# Patient Record
Sex: Female | Born: 1960 | Race: White | Hispanic: No | Marital: Married | Smoking: Former smoker
Health system: Southern US, Community
[De-identification: ages and names within clinical notes are randomized; demographics above are authoritative.]

## PROBLEM LIST (undated history)

## (undated) DIAGNOSIS — I10 Essential (primary) hypertension: Secondary | ICD-10-CM

## (undated) DIAGNOSIS — I429 Cardiomyopathy, unspecified: Secondary | ICD-10-CM

## (undated) DIAGNOSIS — F419 Anxiety disorder, unspecified: Secondary | ICD-10-CM

## (undated) DIAGNOSIS — I509 Heart failure, unspecified: Secondary | ICD-10-CM

## (undated) DIAGNOSIS — C4491 Basal cell carcinoma of skin, unspecified: Secondary | ICD-10-CM

## (undated) DIAGNOSIS — U071 COVID-19: Secondary | ICD-10-CM

## (undated) DIAGNOSIS — R12 Heartburn: Secondary | ICD-10-CM

## (undated) DIAGNOSIS — T7840XA Allergy, unspecified, initial encounter: Secondary | ICD-10-CM

## (undated) DIAGNOSIS — E1165 Type 2 diabetes mellitus with hyperglycemia: Secondary | ICD-10-CM

## (undated) DIAGNOSIS — IMO0002 Reserved for concepts with insufficient information to code with codable children: Secondary | ICD-10-CM

## (undated) HISTORY — DX: Type 2 diabetes mellitus with hyperglycemia: E11.65

## (undated) HISTORY — DX: Anxiety disorder, unspecified: F41.9

## (undated) HISTORY — PX: OTHER SURGICAL HISTORY: SHX169

## (undated) HISTORY — DX: Cardiomyopathy, unspecified: I42.9

## (undated) HISTORY — DX: Allergy, unspecified, initial encounter: T78.40XA

## (undated) HISTORY — DX: Basal cell carcinoma of skin, unspecified: C44.91

## (undated) HISTORY — DX: Heartburn: R12

## (undated) HISTORY — DX: Reserved for concepts with insufficient information to code with codable children: IMO0002

## (undated) HISTORY — DX: COVID-19: U07.1

---

## 1980-12-16 HISTORY — PX: FOOT SURGERY: SHX648

## 1998-12-16 HISTORY — PX: TONSILLECTOMY: SHX5217

## 1998-12-16 HISTORY — PX: UVULECTOMY: SHX2631

## 1998-12-16 HISTORY — PX: NASAL SEPTUM SURGERY: SHX37

## 2006-12-16 HISTORY — PX: CHOLECYSTECTOMY: SHX55

## 2016-03-01 LAB — HM PAP SMEAR: HM Pap smear: NORMAL

## 2016-03-29 ENCOUNTER — Other Ambulatory Visit: Payer: Self-pay | Admitting: Obstetrics and Gynecology

## 2016-03-29 DIAGNOSIS — R928 Other abnormal and inconclusive findings on diagnostic imaging of breast: Secondary | ICD-10-CM

## 2016-04-03 ENCOUNTER — Ambulatory Visit
Admission: RE | Admit: 2016-04-03 | Discharge: 2016-04-03 | Disposition: A | Discharge status: Home / Self Care | Payer: 59 | Source: Ambulatory Visit | Attending: Obstetrics and Gynecology | Admitting: Obstetrics and Gynecology

## 2016-04-03 DIAGNOSIS — R928 Other abnormal and inconclusive findings on diagnostic imaging of breast: Secondary | ICD-10-CM

## 2016-05-08 ENCOUNTER — Encounter: Payer: Self-pay | Admitting: Family

## 2016-05-08 ENCOUNTER — Ambulatory Visit (INDEPENDENT_AMBULATORY_CARE_PROVIDER_SITE_OTHER): Payer: 59 | Admitting: Family

## 2016-05-08 VITALS — BP 112/67 | HR 95 | Temp 98.0°F | Resp 18 | Ht 67.0 in | Wt 210.0 lb

## 2016-05-08 DIAGNOSIS — E119 Type 2 diabetes mellitus without complications: Secondary | ICD-10-CM | POA: Diagnosis not present

## 2016-05-08 DIAGNOSIS — K219 Gastro-esophageal reflux disease without esophagitis: Secondary | ICD-10-CM | POA: Diagnosis not present

## 2016-05-08 DIAGNOSIS — Z9109 Other allergy status, other than to drugs and biological substances: Secondary | ICD-10-CM | POA: Insufficient documentation

## 2016-05-08 DIAGNOSIS — F4321 Adjustment disorder with depressed mood: Secondary | ICD-10-CM

## 2016-05-08 DIAGNOSIS — Z91048 Other nonmedicinal substance allergy status: Secondary | ICD-10-CM

## 2016-05-08 DIAGNOSIS — I429 Cardiomyopathy, unspecified: Secondary | ICD-10-CM | POA: Diagnosis not present

## 2016-05-08 DIAGNOSIS — I428 Other cardiomyopathies: Secondary | ICD-10-CM | POA: Insufficient documentation

## 2016-05-08 DIAGNOSIS — Z85828 Personal history of other malignant neoplasm of skin: Secondary | ICD-10-CM

## 2016-05-08 MED ORDER — ASPIRIN EC 81 MG PO TBEC: 81.0000 mg | DELAYED_RELEASE_TABLET | Freq: Every day | ORAL | Status: DC

## 2016-05-08 MED ORDER — ATORVASTATIN CALCIUM 10 MG PO TABS: 10.0000 mg | ORAL_TABLET | Freq: Every day | ORAL | Status: DC

## 2016-05-08 NOTE — Assessment & Plan Note (Signed)
Stable on daily zyrtec, continue same.  

## 2016-05-08 NOTE — Progress Notes (Signed)
Subjective:    Patient ID: Katrina Kelly, female    DOB: 1961/10/06, 55 y.o.   MRN: [id number]  HPI  Katrina Kelly is a 55 yr old female who presents today to establish care. Pmhx is significant for the folllowing:  1) GERD- maintained on zantac, only takes once a day in the evenings.  reports that her gerd symptoms are generally controlled during the day. Night is worse.   2) Hyperglycemia- was told that her hemoglobin was 6.5 at GYN.   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 Jannette Fogo Pasi. Reports some chronic DOE which is at baseline.  4) hx of depression- Ran out of wellbutrin a few weeks ago.  Reports that she had a premature grandchild, then brother had a stroke.  This was 5 years ago and she had a lot of stress. pcp put her on wellbutrin. Has felt good despite being off of medication for several weeks. Wishes to remain off.    Environmental allergies- has done allergy shots in the past and was on allergy shots in the past. Now managed with daily zyrtec.   Hx Basal cell carcinoma- has follow up tomorrow with Derm. 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  HENT: Positive for rhinorrhea.   Respiratory:       Chronic DOE, at baseline Mild cough from allergy drainage  Cardiovascular: Negative for chest pain.  Gastrointestinal: Negative for diarrhea and constipation.  Genitourinary: Negative for dysuria and frequency.  Musculoskeletal: Negative for joint swelling and arthralgias.  Hematological: Negative for adenopathy.  Psychiatric/Behavioral:       Denies depression/anxiety       Past Medical History  Diagnosis Date  . Anxiety   . Cardiomyopathy ([place])     EF 35% per pt  . Heartburn   . Allergy   . Basal cell carcinoma      Social History   Social History  . Marital Status: Married     Spouse Name: N/A  . Number of Children: N/A  . Years of Education: N/A   Occupational History  . Not on file.   Social History Main Topics  . Smoking status: Former Research scientist (life sciences)  . Smokeless tobacco: Never Used  . Alcohol Use: 0.6 oz/week    1 Standard drinks or equivalent per week  . Drug Use: No  . Sexual Activity: Not on file   Other Topics Concern  . 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   Daughter/fiance and grandson live downstairs   Enjoys beach, decorating/yard work.   One dog       Past Surgical History  Procedure Laterality Date  . Cholecystectomy  2008  . Tonsillectomy  2000  . Uvulectomy  2000  . Nasal septum surgery  2000  . Foot surgery  1982    "benign tumor bottom of right foot"    Family History  Problem Relation Age of Onset  . Diabetes Mother   . Hypertension Mother   . Cancer Father     lung  . Emphysema Father   . Diabetes Sister   . Diabetes Brother   . Leukemia Brother   . Stroke Brother   . Heart attack Maternal Uncle   . Breast cancer Paternal Aunt   .  Lymphoma Paternal Uncle   . Polycystic ovary syndrome Daughter   . Hypertension Daughter     No Known Allergies  No current outpatient prescriptions on file prior to visit.   No current facility-administered medications on file prior to visit.    BP 112/67 mmHg  Pulse 95  Temp(Src) 98 F (36.7 C) (Oral)  Resp 18  Ht 5\' 7"  (1.702 m)  Wt 210 lb (95.255 kg)  BMI 32.88 kg/m2  SpO2 99%  LMP 12/16/2010    Objective:   Physical Exam  Constitutional: She is oriented to person, place, and time. She appears well-developed and well-nourished.  HENT:  Head: Normocephalic and atraumatic.  Right Ear: Tympanic membrane and ear canal normal.  Left Ear: Tympanic membrane and ear canal normal.  Mouth/Throat: No oropharyngeal exudate, posterior oropharyngeal edema or posterior oropharyngeal erythema.   Cardiovascular: Normal rate, regular rhythm and normal heart sounds.   No murmur heard. Pulmonary/Chest: Effort normal and breath sounds normal. No respiratory distress. She has no wheezes.  Musculoskeletal:  Trace bilateral LE edema is noted  Neurological: She is alert and oriented to person, place, and time.  Skin: Skin is warm and dry.  Psychiatric: She has a normal mood and affect. Her behavior is normal. Judgment and thought content normal.          Assessment & Plan:  Hx of situational depression- Advised pt ok to remain off of wellbutrin, call if recurrent depression sxs occur.

## 2016-05-08 NOTE — Assessment & Plan Note (Signed)
Stable, management per cardiology.

## 2016-05-08 NOTE — Assessment & Plan Note (Signed)
A1C 6.5. Discussed diet, exercise, weight loss.  Obtain urine microalbumin. Add statin LDL 111. Goal <100.  Add baby aspirin for cardiac protection.

## 2016-05-08 NOTE — Assessment & Plan Note (Signed)
Followed by derm. Discussed sun safety.

## 2016-05-08 NOTE — Progress Notes (Signed)
Pre visit review using our clinic review tool, if applicable. No additional management support is needed unless otherwise documented below in the visit note. 

## 2016-05-08 NOTE — Patient Instructions (Addendum)
Please complete lab work prior to leaving.  Please add aspirin 81mg  once daily. Add atorvastatin 10mg  once daily.  Take zantac twice daily.  OK to remain off of wellbutrin.  Welcome to [facility]!

## 2016-05-08 NOTE — Assessment & Plan Note (Signed)
Uncontrolled.  Advised pt to increase zantac to bid, continue dietary modification and to work on weight loss.

## 2016-05-10 ENCOUNTER — Ambulatory Visit (INDEPENDENT_AMBULATORY_CARE_PROVIDER_SITE_OTHER): Payer: 59 | Admitting: Family Medicine

## 2016-05-10 ENCOUNTER — Telehealth: Payer: Self-pay | Admitting: Family

## 2016-05-10 ENCOUNTER — Encounter: Payer: Self-pay | Admitting: Family Medicine

## 2016-05-10 VITALS — BP 126/82 | HR 88 | Temp 98.2°F | Ht 67.0 in

## 2016-05-10 DIAGNOSIS — J329 Chronic sinusitis, unspecified: Secondary | ICD-10-CM

## 2016-05-10 DIAGNOSIS — H6593 Unspecified nonsuppurative otitis media, bilateral: Secondary | ICD-10-CM | POA: Diagnosis not present

## 2016-05-10 DIAGNOSIS — J310 Chronic rhinitis: Secondary | ICD-10-CM

## 2016-05-10 DIAGNOSIS — R42 Dizziness and giddiness: Secondary | ICD-10-CM

## 2016-05-10 MED ORDER — AMOXICILLIN-POT CLAVULANATE 875-125 MG PO TABS: 1.0000 | ORAL_TABLET | Freq: Two times a day (BID) | ORAL | Status: DC

## 2016-05-10 MED ORDER — FLUTICASONE PROPIONATE 50 MCG/ACT NA SUSP: 2.0000 | Freq: Every day | NASAL | Status: DC

## 2016-05-10 MED ORDER — MECLIZINE HCL 25 MG PO TABS: 25.0000 mg | ORAL_TABLET | Freq: Three times a day (TID) | ORAL | Status: DC | PRN

## 2016-05-10 NOTE — Patient Instructions (Signed)
Schedule follow up in 1-2 weeks  AFRIN nasal spray twice daily for 4 days  Flonase 2 sprays each nostril daily for 21 days  Continue zyrtec  If sinus issues worsening or not improving then take the antibiotic as instructed  Can use meclizine if dizziness recurs.  If symptoms weakness, numbness, visual loss, trouble speaking, severe or worsening, seek care immediately

## 2016-05-10 NOTE — Progress Notes (Signed)
HPI:  Katrina Kelly a very pleasant 55 year old here for an acute visit for sinus congestion and vertigo. She reports chronic issues with allergies with evaluation with a new provider recently. However she has had worsening of her chronic nasal congestion, cough, postnasal drip and pressure in the ears over the last several days. She also had several episodes of vertigo today. These were all acute in onset and precipitated by a quick movement of the head. The episodes are brief. She did have some nausea. Her mother has a history of vertigo.she has been using some Zyrtec for her allergies and occasionally uses Flonase. She denies headache,sinus pain, tooth pain, weakness, numbness, vision changes, speech changes,fevers, chest pain, shortness of breath, falls or hearing loss.   ROS: See pertinent positives and negatives per HPI.  Past Medical History  Diagnosis Date  . Anxiety   . Cardiomyopathy ([place])     EF 35% per pt  . Heartburn   . Allergy   . Basal cell carcinoma     Past Surgical History  Procedure Laterality Date  . Cholecystectomy  2008  . Tonsillectomy  2000  . Uvulectomy  2000  . Nasal septum surgery  2000  . Foot surgery  1982    "benign tumor bottom of right foot"    Family History  Problem Relation Age of Onset  . Diabetes Mother   . Hypertension Mother   . Cancer Father     lung  . Emphysema Father   . Diabetes Sister   . Diabetes Brother   . Leukemia Brother   . Stroke Brother   . Heart attack Maternal Uncle   . Breast cancer Paternal Aunt   . Lymphoma Paternal Uncle   . Polycystic ovary syndrome Daughter   . Hypertension Daughter     Social History   Social History  . Marital Status: Married    Spouse Name: N/A  . Number of Children: N/A  . Years of Education: N/A   Social History Main Topics  . Smoking status: Former Research scientist (life sciences)  . Smokeless tobacco: Never Used  . Alcohol Use: 0.6 oz/week    1 Standard drinks or equivalent per week  . Drug Use:  No  . Sexual Activity: Not Asked   Other Topics Concern  . 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   Daughter/fiance and grandson live downstairs   Enjoys beach, decorating/yard work.   One dog        Current outpatient prescriptions:  .  aspirin EC 81 MG tablet, Take 1 tablet (81 mg total) by mouth daily., Disp: , Rfl:  .  atorvastatin (LIPITOR) 10 MG tablet, Take 1 tablet (10 mg total) by mouth daily., Disp: 30 tablet, Rfl: 5 .  buPROPion (WELLBUTRIN SR) 150 MG 12 hr tablet, Take 150 mg by mouth daily., Disp: , Rfl:  .  carvedilol (COREG) 6.25 MG tablet, Take 6.25 mg by mouth 2 (two) times daily., Disp: , Rfl:  .  cetirizine (ZYRTEC) 10 MG tablet, Take 10 mg by mouth daily., Disp: , Rfl:  .  furosemide (LASIX) 40 MG tablet, Take 40 mg by mouth daily as needed., Disp: , Rfl:  .  isosorbide mononitrate (IMDUR) 30 MG 24 hr tablet, Take 30 mg by mouth daily., Disp: , Rfl:  .  lisinopril (PRINIVIL,ZESTRIL) 5 MG tablet, Take 5 mg by mouth daily., Disp: , Rfl:  .  nitroGLYCERIN (NITROSTAT)  0.4 MG SL tablet, Place 0.4 mg under the tongue as needed., Disp: , Rfl:  .  potassium chloride (K-DUR,KLOR-CON) 10 MEQ tablet, Take 10 mEq by mouth daily as needed., Disp: , Rfl:  .  ranitidine (ZANTAC) 150 MG tablet, Take 150 mg by mouth daily., Disp: , Rfl:  .  amoxicillin-clavulanate (AUGMENTIN) 875-125 MG tablet, Take 1 tablet by mouth 2 (two) times daily., Disp: 20 tablet, Rfl: 0 .  fluticasone (FLONASE) 50 MCG/ACT nasal spray, Place 2 sprays into both nostrils daily., Disp: 16 g, Rfl: 0 .  meclizine (ANTIVERT) 25 MG tablet, Take 1 tablet (25 mg total) by mouth 3 (three) times daily as needed for dizziness., Disp: 30 tablet, Rfl: 0 .  oxymetazoline (AFRIN NASAL SPRAY) 0.05 % nasal spray, Place 2 sprays into both nostrils 2 (two) times daily., Disp: 30 mL, Rfl: 0  EXAM:  Filed Vitals:   05/10/16 1439  BP: 126/82   Pulse: 88  Temp: 98.2 F (36.8 C)    There is no weight on file to calculate BMI.  GENERAL: vitals reviewed and listed above, alert, oriented, appears well hydrated and in no acute distress  HEENT: atraumatic, conjunttiva clear,visual acuity grossly intact, PERRLA, no obvious abnormalities on inspection of external nose and ears, normal appearance of ear canals and TMs with clear effusions bilaterally, thick white nasal congestion, mild post oropharyngeal erythema with PND, no tonsillar edema or exudate, no sinus TTP  NECK: no obvious masses on inspection, no bruit  LUNGS: clear to auscultation bilaterally, no wheezes, rales or rhonchi, good air movement  CV: HRRR, no peripheral edema  MS: moves all extremities without noticeable abnormality  PSYCH: pleasant and cooperative, no obvious depression or anxiety  NEURO: cranial nerves II through XII grossly intact, finger to nose normal, speech and thought processing grossly intact, gait is normal on my exam in the room, no appreciable weakness, Katrina Kelly Round is negative and actually results in resolution of her symptoms   ASSESSMENT AND PLAN:  Discussed the following assessment and plan:  Vertigo -we discussed possible serious and likely etiologies, workup and treatment, treatment risks and return precautions-sounded like positional vertigo from the history, resolved in the room, she reported this was miraculous and was happy. I am not sure if the maneuvers we did testing precipitated this? Sinuses and eustachian tube dysfunction could be playing a role as well. -after this discussion, Katrina Kelly opted for meclizine to use if needed, close follow-up with PCP to ensure resolution of symptoms -of course, we advised Katrina Kelly  to return or notify a doctor immediately if symptoms worsen or persist or new concerns arise.  Rhinosinusitis Middle ear effusion, bilateral -she is not having sinus pain or fevers at this point, but she has thick white  purulent appearing discharge in the nasal passages -I have seen vertigo as a symptom is sinus infection -We will up her allergy regimen and treat with a short course of nasal decongestant, if symptoms do not improve she was given a printed antibiotic to take if needed after discussion of risks and return precautions  -Patient advised to return or notify a doctor immediately if symptoms worsen or persist or new concerns arise.  Patient Instructions  Schedule follow up in 1-2 weeks  AFRIN nasal spray twice daily for 4 days  Flonase 2 sprays each nostril daily for 21 days  Continue zyrtec  If sinus issues worsening or not improving then take the antibiotic as instructed  Can use meclizine if dizziness recurs.  If  symptoms weakness, numbness, visual loss, trouble speaking, severe or worsening, seek care immediately     Tanganyika Bowlds R.

## 2016-05-10 NOTE — Telephone Encounter (Signed)
error:[id number] ° °

## 2016-05-10 NOTE — Progress Notes (Signed)
Pre visit review using our clinic review tool, if applicable. No additional management support is needed unless otherwise documented below in the visit note. 

## 2016-08-13 ENCOUNTER — Encounter: Payer: Self-pay | Admitting: Family

## 2016-08-13 ENCOUNTER — Encounter: Payer: Self-pay | Admitting: Gastroenterology

## 2016-08-13 ENCOUNTER — Ambulatory Visit (INDEPENDENT_AMBULATORY_CARE_PROVIDER_SITE_OTHER): Payer: 59 | Admitting: Family

## 2016-08-13 VITALS — BP 114/57 | HR 46 | Temp 98.2°F | Resp 17 | Ht 67.0 in | Wt 208.8 lb

## 2016-08-13 DIAGNOSIS — R0981 Nasal congestion: Secondary | ICD-10-CM

## 2016-08-13 DIAGNOSIS — K219 Gastro-esophageal reflux disease without esophagitis: Secondary | ICD-10-CM | POA: Diagnosis not present

## 2016-08-13 DIAGNOSIS — Z23 Encounter for immunization: Secondary | ICD-10-CM | POA: Diagnosis not present

## 2016-08-13 DIAGNOSIS — Z0000 Encounter for general adult medical examination without abnormal findings: Secondary | ICD-10-CM | POA: Diagnosis not present

## 2016-08-13 DIAGNOSIS — R110 Nausea: Secondary | ICD-10-CM | POA: Diagnosis not present

## 2016-08-13 DIAGNOSIS — H579 Unspecified disorder of eye and adnexa: Secondary | ICD-10-CM

## 2016-08-13 DIAGNOSIS — E2839 Other primary ovarian failure: Secondary | ICD-10-CM

## 2016-08-13 LAB — TSH: TSH: 2.28 u[IU]/mL (ref 0.35–4.50)

## 2016-08-13 LAB — CBC WITH DIFFERENTIAL/PLATELET
Basophils Absolute: 0.1 10*3/uL (ref 0.0–0.1)
Basophils Relative: 1 % (ref 0.0–3.0)
Eosinophils Absolute: 0.2 10*3/uL (ref 0.0–0.7)
Eosinophils Relative: 2.4 % (ref 0.0–5.0)
HCT: 43.1 % (ref 36.0–46.0)
Hemoglobin: 14.5 g/dL (ref 12.0–15.0)
Lymphocytes Relative: 33.2 % (ref 12.0–46.0)
Lymphs Abs: 3 10*3/uL (ref 0.7–4.0)
MCHC: 33.8 g/dL (ref 30.0–36.0)
MCV: 87.2 fl (ref 78.0–100.0)
Monocytes Absolute: 0.7 10*3/uL (ref 0.1–1.0)
Monocytes Relative: 7.7 % (ref 3.0–12.0)
Neutro Abs: 5.1 10*3/uL (ref 1.4–7.7)
Neutrophils Relative %: 55.7 % (ref 43.0–77.0)
Platelets: 323 10*3/uL (ref 150.0–400.0)
RBC: 4.94 Mil/uL (ref 3.87–5.11)
RDW: 13 % (ref 11.5–15.5)
WBC: 9.1 10*3/uL (ref 4.0–10.5)

## 2016-08-13 LAB — LIPID PANEL
Cholesterol: 121 mg/dL (ref 0–200)
HDL: 33.6 mg/dL — ABNORMAL LOW (ref >39.00)
LDL Cholesterol: 61 mg/dL (ref 0–99)
NonHDL: 87.42
Total CHOL/HDL Ratio: 4
Triglycerides: 130 mg/dL (ref 0.0–149.0)
VLDL: 26 mg/dL (ref 0.0–40.0)

## 2016-08-13 LAB — URINALYSIS, ROUTINE W REFLEX MICROSCOPIC
Bilirubin Urine: NEGATIVE
Hgb urine dipstick: NEGATIVE
Ketones, ur: NEGATIVE
Nitrite: NEGATIVE
Specific Gravity, Urine: 1.025 (ref 1.000–1.030)
Total Protein, Urine: NEGATIVE
Urine Glucose: NEGATIVE
Urobilinogen, UA: 0.2 (ref 0.0–1.0)
pH: 5 (ref 5.0–8.0)

## 2016-08-13 LAB — BASIC METABOLIC PANEL
BUN: 11 mg/dL (ref 6–23)
CO2: 26 mEq/L (ref 19–32)
Calcium: 8.8 mg/dL (ref 8.4–10.5)
Chloride: 107 mEq/L (ref 96–112)
Creatinine, Ser: 0.82 mg/dL (ref 0.40–1.20)
GFR: 76.94 mL/min (ref >60.00)
Glucose, Bld: 156 mg/dL — ABNORMAL HIGH (ref 70–99)
Potassium: 3.9 mEq/L (ref 3.5–5.1)
Sodium: 139 mEq/L (ref 135–145)

## 2016-08-13 LAB — HEPATIC FUNCTION PANEL
ALT: 23 U/L (ref 0–35)
AST: 30 U/L (ref 0–37)
Albumin: 3.9 g/dL (ref 3.5–5.2)
Alkaline Phosphatase: 69 U/L (ref 39–117)
Bilirubin, Direct: 0.1 mg/dL (ref 0.0–0.3)
Total Bilirubin: 0.6 mg/dL (ref 0.2–1.2)
Total Protein: 7.5 g/dL (ref 6.0–8.3)

## 2016-08-13 LAB — H. PYLORI ANTIBODY, IGG: H Pylori IgG: NEGATIVE

## 2016-08-13 MED ORDER — PANTOPRAZOLE SODIUM 40 MG PO TBEC: 40.0000 mg | DELAYED_RELEASE_TABLET | Freq: Every day | ORAL | 3 refills | Status: DC

## 2016-08-13 NOTE — Progress Notes (Signed)
Subjective:    Patient ID: Katrina Kelly, female    DOB: 03/31/1961, 55 y.o.   MRN: [id number]  HPI  Patient presents today for complete physical.  Immunizations: last tetanus was 6 years ago.   Diet:  Tries to eat a healthy diet, could cut down on processed food Exercise:  Not exercising Dexa: due Pap Smear: 4/17, + HPV (following with GYN)  Mammogram: 4/17 (small cyst) Vision: up to date Dental: up to date [place], due  Patient reports c/o post prandial GI upset.  Has BM diarrhea within 10 minutes of eating after many of her meals.  Tends to be worse after fried foods and heavy food like "fettucine alfredo." Not a new problem but has been worsening x 2 weeks.  She had cholecystectomy back in 2008.  She does have hx of GERD and takes zantac 1 tab 1-2 times a day.   Wt Readings from Last 3 Encounters:  08/13/16 208 lb 12.8 oz (94.7 kg)  05/08/16 210 lb (95.3 kg)   Reports lesions near her eyes that she would like removed. Derm told her to see eye doctor.    Review of Systems  Constitutional: Negative for fever and unexpected weight change.  HENT: Negative for hearing loss.        + sinus congestion x < 1 week.    Eyes: Negative for visual disturbance.  Respiratory: Positive for shortness of breath. Negative for cough.   Cardiovascular: Negative for chest pain and leg swelling.       Reports that she is following with cardiology. Reports SOB with walking up flight of stairs. Reports baseline  Gastrointestinal: Positive for diarrhea. Negative for anal bleeding and constipation.  Genitourinary: Negative for dysuria and frequency.  Musculoskeletal:       Some knee pain  Skin: Negative for rash.       Following with derm for hx of skin CA- has q6 month skin check.    Neurological: Negative for headaches.  Hematological: Negative for adenopathy.  Psychiatric/Behavioral:       Denies depression/anxiety       Past Medical History:  Diagnosis Date  . Allergy   . Anxiety     . Basal cell carcinoma   . Cardiomyopathy ([place])    EF 35% per pt  . Heartburn      Social History   Social History  . Marital status: Married    Spouse name: N/A  . Number of children: N/A  . Years of education: N/A   Occupational History  . Not on file.   Social History Main Topics  . Smoking status: Former Research scientist (life sciences)  . Smokeless tobacco: Never Used  . Alcohol use 0.6 oz/week    1 Standard drinks or equivalent per week  . Drug use: No  . Sexual activity: Not on file   Other Topics Concern  . 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   Daughter/fiance and grandson live downstairs   Enjoys beach, decorating/yard work.   One dog       Past Surgical History:  Procedure Laterality Date  . CHOLECYSTECTOMY  2008  . FOOT SURGERY  1982   "benign tumor bottom of right foot"  . NASAL SEPTUM SURGERY  2000  . TONSILLECTOMY  2000  . UVULECTOMY  2000    Family History  Problem Relation Age of Onset  . Diabetes Mother   .  Hypertension Mother   . Cancer Father     lung  . Emphysema Father   . Diabetes Sister   . Diabetes Brother   . Leukemia Brother   . Stroke Brother   . Heart attack Maternal Uncle   . Breast cancer Paternal Aunt   . Lymphoma Paternal Uncle   . Polycystic ovary syndrome Daughter   . Hypertension Daughter     No Known Allergies  Current Outpatient Prescriptions on File Prior to Visit  Medication Sig Dispense Refill  . aspirin EC 81 MG tablet Take 1 tablet (81 mg total) by mouth daily.    Marland Kitchen atorvastatin (LIPITOR) 10 MG tablet Take 1 tablet (10 mg total) by mouth daily. 30 tablet 5  . carvedilol (COREG) 6.25 MG tablet Take 6.25 mg by mouth 2 (two) times daily.    . cetirizine (ZYRTEC) 10 MG tablet Take 10 mg by mouth daily.    . fluticasone (FLONASE) 50 MCG/ACT nasal spray Place 2 sprays into both nostrils daily. 16 g 0  . furosemide (LASIX) 40 MG tablet Take 40 mg  by mouth daily as needed.    . isosorbide mononitrate (IMDUR) 30 MG 24 hr tablet Take 30 mg by mouth daily.    Marland Kitchen lisinopril (PRINIVIL,ZESTRIL) 5 MG tablet Take 5 mg by mouth daily.    . meclizine (ANTIVERT) 25 MG tablet Take 1 tablet (25 mg total) by mouth 3 (three) times daily as needed for dizziness. 30 tablet 0  . oxymetazoline (AFRIN NASAL SPRAY) 0.05 % nasal spray Place 2 sprays into both nostrils 2 (two) times daily. 30 mL 0  . potassium chloride (K-DUR,KLOR-CON) 10 MEQ tablet Take 10 mEq by mouth daily as needed.    . nitroGLYCERIN (NITROSTAT) 0.4 MG SL tablet Place 0.4 mg under the tongue as needed.     No current facility-administered medications on file prior to visit.     BP (!) 114/57 (BP Location: Right Arm, Patient Position: Sitting, Cuff Size: Normal)   Pulse (!) 46   Temp 98.2 F (36.8 C) (Oral)   Resp 17   Ht 5\' 7"  (1.702 m)   Wt 208 lb 12.8 oz (94.7 kg)   SpO2 97%   BMI 32.70 kg/m    Objective:   Physical Exam  Physical Exam  Constitutional: She is oriented to person, place, and time. She appears well-developed and well-nourished. No distress.  HENT:  Head: Normocephalic and atraumatic.  Right Ear: Tympanic membrane and ear canal normal.  Left Ear: Tympanic membrane and ear canal normal.  Mouth/Throat: Oropharynx is clear and moist.  Eyes: Pupils are equal, round, and reactive to light. No scleral icterus. + small cysts beneath bilateral inner inner canthus Neck: Normal range of motion. No thyromegaly present.  Cardiovascular: Normal rate and regular rhythm.   No murmur heard. Pulmonary/Chest: Effort normal and breath sounds normal. No respiratory distress. He has no wheezes. She has no rales. She exhibits no tenderness.  Abdominal: Soft. Bowel sounds are normal. She exhibits no distension and no mass. There is no tenderness. There is no rebound and no guarding.  Musculoskeletal: She exhibits no edema.  Lymphadenopathy:    She has no cervical adenopathy.    Neurological: She is alert and oriented to person, place, and time. She has normal patellar reflexes. She exhibits normal muscle tone. Coordination normal.  Skin: Skin is warm and dry.  Psychiatric: She has a normal mood and affect. Her behavior is normal. Judgment and thought content normal.  Breast/pelvic:  deferred to GYN         Assessment & Plan:         Assessment & Plan:  GERD- Suspect GI sxs secondary to GERD, hx of cholecystectomy and lactose intolerance. Advised pt as follows. Please stop zantac, begin protonix once daily.  Avoid dairy and fried foods. Check H pylori IgG.  Plan follow up in 6 weeks. If symptoms worsen or do not improve, plan to refer to GI.    Preventative Care-  Flu shot today, refer for dexa, colo. Discussed healthy diet, exercise and weight loss. Obtain routine lab work. Flu shot today.   Sinus congestion- Continue zyrtec, add a nasal saline rinse. Call if sinus congestion worsen or if it is not improved in 2-3 days. Will consider abx at that time.   Eye lesions- refer to opthalmology for excision.

## 2016-08-13 NOTE — Progress Notes (Signed)
Pre visit review using our clinic review tool, if applicable. No additional management support is needed unless otherwise documented below in the visit note. 

## 2016-08-13 NOTE — Patient Instructions (Addendum)
Please stop zantac, begin protonix once daily.  Avoid dairy and fried foods. Continue zyrtec, add a nasal saline rinse. Call if sinus congestion worsen or if it is not improved in 2-3 days.  Please complete lab work prior to leaving. You will be contacted about your referral to opthalmology.

## 2016-08-15 ENCOUNTER — Other Ambulatory Visit (INDEPENDENT_AMBULATORY_CARE_PROVIDER_SITE_OTHER): Payer: 59

## 2016-08-15 DIAGNOSIS — R739 Hyperglycemia, unspecified: Secondary | ICD-10-CM

## 2016-08-15 LAB — HEMOGLOBIN A1C: Hgb A1c MFr Bld: 7.3 % — ABNORMAL HIGH (ref 4.6–6.5)

## 2016-08-16 ENCOUNTER — Ambulatory Visit (HOSPITAL_BASED_OUTPATIENT_CLINIC_OR_DEPARTMENT_OTHER)
Admission: RE | Admit: 2016-08-16 | Discharge: 2016-08-16 | Disposition: A | Discharge status: Home / Self Care | Payer: 59 | Source: Ambulatory Visit | Attending: Family | Admitting: Family

## 2016-08-16 ENCOUNTER — Encounter: Payer: Self-pay | Admitting: Family

## 2016-08-16 ENCOUNTER — Telehealth: Payer: Self-pay | Admitting: Family

## 2016-08-16 DIAGNOSIS — E1165 Type 2 diabetes mellitus with hyperglycemia: Principal | ICD-10-CM

## 2016-08-16 DIAGNOSIS — E2839 Other primary ovarian failure: Secondary | ICD-10-CM

## 2016-08-16 DIAGNOSIS — IMO0001 Reserved for inherently not codable concepts without codable children: Secondary | ICD-10-CM

## 2016-08-16 DIAGNOSIS — E118 Type 2 diabetes mellitus with unspecified complications: Secondary | ICD-10-CM | POA: Insufficient documentation

## 2016-08-16 NOTE — Telephone Encounter (Signed)
Please let pt know that A1C shows diabetes.  a1C is 7.3, diagnosis of DM is 6.5 and greater. I would like to see it under 7. Please work on avoiding concentrated sweets/white carbs, increasing exercise and weight loss. Follow up in 3 months.

## 2016-08-16 NOTE — Telephone Encounter (Signed)
Also, please let her know that her bone density is normal.

## 2016-08-16 NOTE — Telephone Encounter (Signed)
Left message for pt to return my call on Tuesday.

## 2016-08-16 NOTE — Telephone Encounter (Signed)
Left message on patient mobile phone to give a call back.

## 2016-08-20 NOTE — Telephone Encounter (Signed)
Notified pt and she voices understanding. 

## 2016-09-23 ENCOUNTER — Encounter: Payer: Self-pay | Admitting: Family

## 2016-09-23 ENCOUNTER — Ambulatory Visit (INDEPENDENT_AMBULATORY_CARE_PROVIDER_SITE_OTHER): Payer: 59 | Admitting: Family

## 2016-09-23 DIAGNOSIS — Z9109 Other allergy status, other than to drugs and biological substances: Secondary | ICD-10-CM

## 2016-09-23 DIAGNOSIS — K219 Gastro-esophageal reflux disease without esophagitis: Secondary | ICD-10-CM | POA: Diagnosis not present

## 2016-09-23 DIAGNOSIS — G5600 Carpal tunnel syndrome, unspecified upper limb: Secondary | ICD-10-CM | POA: Insufficient documentation

## 2016-09-23 DIAGNOSIS — G5601 Carpal tunnel syndrome, right upper limb: Secondary | ICD-10-CM

## 2016-09-23 MED ORDER — MELOXICAM 7.5 MG PO TABS: 7.5000 mg | ORAL_TABLET | Freq: Every day | ORAL | 0 refills | Status: DC

## 2016-09-23 NOTE — Progress Notes (Signed)
Subjective:    Patient ID: Katrina Kelly, female    DOB: 02-Nov-1961, 55 y.o.   MRN: [id number]  HPI  Katrina Kelly is a 55 yr old female who presents today for follow up.  GERD-Last visit she noted post prandial GI upset and post prandial diarrhea.  She was on zantac at that visit.  She was advised to stop zantac and begin protonix at that visit. We also discussed avoidance of dairy and fried foods.  H pylori testing was negative. She notes that she has changed her diet and symptoms are much improved.   Wt Readings from Last 3 Encounters:  09/23/16 209 lb 9.6 oz (95.1 kg)  08/13/16 208 lb 12.8 oz (94.7 kg)  05/08/16 210 lb (95.3 kg)   Sinus symptoms are stable on zyrtec.   R hand numbness- occurs when typing and holding the phone. Has been going on for quite some time.   Review of Systems See HPI  Past Medical History:  Diagnosis Date  . Allergy   . Anxiety   . Basal cell carcinoma   . Cardiomyopathy ([place])    EF 35% per pt  . Diabetes type 2, uncontrolled ([place])   . Heartburn      Social History   Social History  . Marital status: Married    Spouse name: N/A  . Number of children: N/A  . Years of education: N/A   Occupational History  . Not on file.   Social History Main Topics  . Smoking status: Former Research scientist (life sciences)  . Smokeless tobacco: Never Used  . Alcohol use 0.6 oz/week    1 Standard drinks or equivalent per week  . Drug use: No  . Sexual activity: Not on file   Other Topics Concern  . 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   Daughter/fiance and grandson live downstairs   Enjoys beach, decorating/yard work.   One dog       Past Surgical History:  Procedure Laterality Date  . CHOLECYSTECTOMY  2008  . FOOT SURGERY  1982   "benign tumor bottom of right foot"  . NASAL SEPTUM SURGERY  2000  . TONSILLECTOMY  2000  . UVULECTOMY  2000    Family History  Problem Relation  Age of Onset  . Diabetes Mother   . Hypertension Mother   . Cancer Father     lung  . Emphysema Father   . Diabetes Sister   . Diabetes Brother   . Leukemia Brother   . Stroke Brother   . Heart attack Maternal Uncle   . Breast cancer Paternal Aunt   . Lymphoma Paternal Uncle   . Polycystic ovary syndrome Daughter   . Hypertension Daughter   . Diabetes Mellitus II Maternal Grandmother     No Known Allergies  Current Outpatient Prescriptions on File Prior to Visit  Medication Sig Dispense Refill  . atorvastatin (LIPITOR) 10 MG tablet Take 1 tablet (10 mg total) by mouth daily. 30 tablet 5  . carvedilol (COREG) 6.25 MG tablet Take 6.25 mg by mouth 2 (two) times daily.    . cetirizine (ZYRTEC) 10 MG tablet Take 10 mg by mouth daily.    . furosemide (LASIX) 40 MG tablet Take 40 mg by mouth daily as needed.    . isosorbide mononitrate (IMDUR) 30 MG 24 hr tablet Take 30 mg by mouth daily.    Marland Kitchen lisinopril (  PRINIVIL,ZESTRIL) 5 MG tablet Take 5 mg by mouth daily.    Marland Kitchen oxymetazoline (AFRIN NASAL SPRAY) 0.05 % nasal spray Place 2 sprays into both nostrils 2 (two) times daily. 30 mL 0  . pantoprazole (PROTONIX) 40 MG tablet Take 1 tablet (40 mg total) by mouth daily. 30 tablet 3  . potassium chloride (K-DUR,KLOR-CON) 10 MEQ tablet Take 10 mEq by mouth daily as needed.    . nitroGLYCERIN (NITROSTAT) 0.4 MG SL tablet Place 0.4 mg under the tongue as needed.     No current facility-administered medications on file prior to visit.     BP 119/68 (BP Location: Right Arm, Cuff Size: Large)   Pulse 78   Temp 98.2 F (36.8 C) (Oral)   Resp 18   Ht 5\' 7"  (1.702 m)   Wt 209 lb 9.6 oz (95.1 kg)   SpO2 98% Comment: room air  BMI 32.83 kg/m       Objective:   Physical Exam  Constitutional: She is oriented to person, place, and time. She appears well-developed and well-nourished.  Cardiovascular: Normal rate, regular rhythm and normal heart sounds.   No murmur heard. Pulmonary/Chest: Effort  normal and breath sounds normal. No respiratory distress. She has no wheezes.  Neurological: She is alert and oriented to person, place, and time.  R hand, Neg Tinels, + Phalans  Psychiatric: She has a normal mood and affect. Her behavior is normal. Judgment and thought content normal.          Assessment & Plan:

## 2016-09-23 NOTE — Assessment & Plan Note (Signed)
Stable on zyrtec.  Continue same.  

## 2016-09-23 NOTE — Progress Notes (Signed)
Pre visit review using our clinic review tool, if applicable. No additional management support is needed unless otherwise documented below in the visit note. 

## 2016-09-23 NOTE — Assessment & Plan Note (Signed)
Patient was given a wrist brace today to use while sleeping and as able during the day. Will also give short course of meloxicam.

## 2016-09-23 NOTE — Patient Instructions (Signed)
Wear wrist splint while sleeping and as you are able throughout the day. Begin meloxicam once daily for right carpal tunnel. Call if new/worsening symptoms.

## 2016-09-23 NOTE — Assessment & Plan Note (Signed)
Improved, continue dietary changes.  Continue protonix.

## 2016-09-26 ENCOUNTER — Other Ambulatory Visit: Payer: Self-pay

## 2016-09-26 ENCOUNTER — Telehealth: Payer: Self-pay

## 2016-09-26 DIAGNOSIS — Z1211 Encounter for screening for malignant neoplasm of colon: Secondary | ICD-10-CM

## 2016-09-26 NOTE — Telephone Encounter (Signed)
Colonoscopy at [facility] 11/01/16 Procedure at 11:00. Arrive at 10:00 am Keep pre-visit appointment

## 2016-09-26 NOTE — Telephone Encounter (Signed)
Dr Silverio Decamp,      According to [place] on 09/23/16 pt has an EF of 35% which disqualifies her for [place] per anesthesia protocol.  Would you like an OV or make her a direct [facility] pt?                                                            Thank you,                                                                     Angela/PV

## 2016-09-26 NOTE — Telephone Encounter (Signed)
She can be direct at hospital next available non urgent block for scope at [facility]. Please do not schedule during inpatient week.

## 2016-09-26 NOTE — Telephone Encounter (Signed)
Patient contacted and colonoscopy is moved to 11/01/16.

## 2016-09-30 ENCOUNTER — Ambulatory Visit (AMBULATORY_SURGERY_CENTER): Payer: Self-pay

## 2016-09-30 VITALS — Ht 67.0 in | Wt 210.8 lb

## 2016-09-30 DIAGNOSIS — Z1211 Encounter for screening for malignant neoplasm of colon: Secondary | ICD-10-CM

## 2016-09-30 MED ORDER — SUPREP BOWEL PREP KIT 17.5-3.13-1.6 GM/177ML PO SOLN
1.0000 | Freq: Once | ORAL | 0 refills | Status: AC
Start: 2016-09-30 — End: 2016-09-30

## 2016-09-30 NOTE — Progress Notes (Signed)
No allergies to eggs or soy No past problems with anesthesia No diet meds No home oxygen  Declined emmi 

## 2016-10-08 ENCOUNTER — Telehealth: Payer: Self-pay | Admitting: Gastroenterology

## 2016-10-08 NOTE — Telephone Encounter (Signed)
Returned patients call  Will give her a Suprep kit when they arrive.[facility] staff message to put patient on list

## 2016-10-14 ENCOUNTER — Encounter: Payer: 59 | Admitting: Gastroenterology

## 2016-10-25 ENCOUNTER — Encounter (HOSPITAL_COMMUNITY): Payer: Self-pay | Admitting: *Deleted

## 2016-10-25 ENCOUNTER — Telehealth: Payer: Self-pay

## 2016-10-25 NOTE — Telephone Encounter (Signed)
Lm on vm that I would leave a Suprep sample up front to be picked up 

## 2016-10-31 ENCOUNTER — Telehealth: Payer: Self-pay | Admitting: Gastroenterology

## 2016-10-31 NOTE — Telephone Encounter (Signed)
ok 

## 2016-10-31 NOTE — Telephone Encounter (Signed)
Dr Silverio Decamp, This patient called and left a message on phone tree but we have not been able to get in touch with her about cancelling her procedure or the reason why. I tried contacting the patient to verify but she has not returned my call   FYI In case she does not show up tomorrow

## 2016-11-01 ENCOUNTER — Ambulatory Visit (HOSPITAL_BASED_OUTPATIENT_CLINIC_OR_DEPARTMENT_OTHER)
Admission: RE | Admit: 2016-11-01 | Discharge: 2016-11-01 | Disposition: A | Discharge status: Home / Self Care | Payer: 59 | Source: Ambulatory Visit | Attending: Medical | Admitting: Medical

## 2016-11-01 ENCOUNTER — Ambulatory Visit (INDEPENDENT_AMBULATORY_CARE_PROVIDER_SITE_OTHER): Payer: 59 | Admitting: Medical

## 2016-11-01 ENCOUNTER — Telehealth: Payer: Self-pay

## 2016-11-01 ENCOUNTER — Encounter (HOSPITAL_COMMUNITY): Payer: Self-pay | Admitting: Anesthesiology

## 2016-11-01 ENCOUNTER — Encounter: Payer: Self-pay | Admitting: Medical

## 2016-11-01 ENCOUNTER — Ambulatory Visit (HOSPITAL_COMMUNITY): Admission: RE | Admit: 2016-11-01 | Payer: 59 | Source: Ambulatory Visit | Admitting: Gastroenterology

## 2016-11-01 VITALS — BP 120/76 | Temp 98.4°F | Ht 67.0 in | Wt 210.0 lb

## 2016-11-01 DIAGNOSIS — R059 Cough, unspecified: Secondary | ICD-10-CM

## 2016-11-01 DIAGNOSIS — J029 Acute pharyngitis, unspecified: Secondary | ICD-10-CM | POA: Diagnosis not present

## 2016-11-01 DIAGNOSIS — R6883 Chills (without fever): Secondary | ICD-10-CM | POA: Diagnosis not present

## 2016-11-01 DIAGNOSIS — R05 Cough: Secondary | ICD-10-CM

## 2016-11-01 DIAGNOSIS — D72829 Elevated white blood cell count, unspecified: Secondary | ICD-10-CM

## 2016-11-01 DIAGNOSIS — M791 Myalgia, unspecified site: Secondary | ICD-10-CM

## 2016-11-01 HISTORY — DX: Heart failure, unspecified: I50.9

## 2016-11-01 HISTORY — DX: Essential (primary) hypertension: I10

## 2016-11-01 LAB — CBC WITH DIFFERENTIAL/PLATELET
Basophils Absolute: 0.1 10*3/uL (ref 0.0–0.1)
Basophils Relative: 0.5 % (ref 0.0–3.0)
Eosinophils Absolute: 0.1 10*3/uL (ref 0.0–0.7)
Eosinophils Relative: 0.3 % (ref 0.0–5.0)
HCT: 41.6 % (ref 36.0–46.0)
Hemoglobin: 13.9 g/dL (ref 12.0–15.0)
Lymphocytes Relative: 12.7 % (ref 12.0–46.0)
Lymphs Abs: 2.8 10*3/uL (ref 0.7–4.0)
MCHC: 33.5 g/dL (ref 30.0–36.0)
MCV: 86.8 fl (ref 78.0–100.0)
Monocytes Absolute: 2 10*3/uL — ABNORMAL HIGH (ref 0.1–1.0)
Monocytes Relative: 8.8 % (ref 3.0–12.0)
Neutro Abs: 17.2 10*3/uL — ABNORMAL HIGH (ref 1.4–7.7)
Neutrophils Relative %: 77.7 % — ABNORMAL HIGH (ref 43.0–77.0)
Platelets: 291 10*3/uL (ref 150.0–400.0)
RBC: 4.8 Mil/uL (ref 3.87–5.11)
RDW: 13.4 % (ref 11.5–15.5)
WBC: 22.2 10*3/uL (ref 4.0–10.5)

## 2016-11-01 LAB — POC INFLUENZA A&B (BINAX/QUICKVUE)
Influenza A, POC: NEGATIVE
Influenza B, POC: NEGATIVE

## 2016-11-01 LAB — POCT RAPID STREP A (OFFICE): Rapid Strep A Screen: POSITIVE — AB

## 2016-11-01 LAB — CBC AND DIFFERENTIAL: WBC: 22.2 10*3/mL

## 2016-11-01 SURGERY — COLONOSCOPY WITH PROPOFOL
Anesthesia: Monitor Anesthesia Care

## 2016-11-01 MED ORDER — PROPOFOL 10 MG/ML IV BOLUS
INTRAVENOUS | Status: AC
  Filled 2016-11-01: qty 40

## 2016-11-01 MED ORDER — BENZONATATE 100 MG PO CAPS: 100.0000 mg | ORAL_CAPSULE | Freq: Three times a day (TID) | ORAL | 0 refills | Status: DC | PRN

## 2016-11-01 MED ORDER — FLUTICASONE PROPIONATE 50 MCG/ACT NA SUSP: 2.0000 | Freq: Every day | NASAL | 1 refills | Status: DC

## 2016-11-01 MED ORDER — LIDOCAINE 2% (20 MG/ML) 5 ML SYRINGE
INTRAMUSCULAR | Status: AC
  Filled 2016-11-01: qty 5

## 2016-11-01 MED ORDER — AMOXICILLIN-POT CLAVULANATE 875-125 MG PO TABS: 1.0000 | ORAL_TABLET | Freq: Two times a day (BID) | ORAL | 0 refills | Status: DC

## 2016-11-01 MED ORDER — ALBUTEROL SULFATE HFA 108 (90 BASE) MCG/ACT IN AERS: 2.0000 | INHALATION_SPRAY | Freq: Four times a day (QID) | RESPIRATORY_TRACT | 0 refills | Status: DC | PRN

## 2016-11-01 NOTE — Patient Instructions (Addendum)
Your strep test was positive(faint). I am prescribing augmentin  antibiotic. Rest hydrate, tylenol for fever and warm salt water gargles.   Your flu test was negative today.  You also have some bronchitis type symptoms but want to get cxr as well. Make sure no pneumonia.  For cough rx benzonatate. For nasl congestion fx flonase. For recent mild wheezing rx albuterol.  Please get cbc today as well.   Follow up in 7 days or as needed  If chest xray shows pneumonia may add additional anitbiotic.   See the note made regarding high wbc. Called pt and made plan.

## 2016-11-01 NOTE — Progress Notes (Signed)
Pre visit review using our clinic review tool, if applicable. No additional management support is needed unless otherwise documented below in the visit note. 

## 2016-11-01 NOTE — Telephone Encounter (Signed)
Received call from [facility] with the lab regarding a critical wbc result =22.2 on patient. Result given to E. Saguier,PA-C. Abstracted in patients chart.

## 2016-11-01 NOTE — Progress Notes (Addendum)
Subjective:    Patient ID: Katrina Kelly, female    DOB: 1961/08/24, 55 y.o.   MRN: [id number]  HPI  Pt in stating on wednesday night started with some chills. ST started Thursday. Pain is severe. Pain swallowing. Pt states some coughing. Productive cough with slight blood tinge to  mucous. Pt grandson had strep throat. He lives with pt.   Pt is wheezing some/mild. No history of asthma. Pt not a smoker.   On wed had body aches. She also had chills   Review of Systems  Constitutional: Positive for chills, fatigue and fever.  HENT: Positive for congestion and sore throat. Negative for facial swelling, nosebleeds, postnasal drip, sinus pain and sinus pressure.   Respiratory: Positive for cough and wheezing.   Cardiovascular: Negative for chest pain and palpitations.  Gastrointestinal: Negative for abdominal pain.  Musculoskeletal: Positive for myalgias.       On wed but now none.  Neurological: Negative for dizziness, seizures, speech difficulty, weakness and headaches.  Hematological: Positive for adenopathy. Does not bruise/bleed easily.  Psychiatric/Behavioral: Negative for behavioral problems and confusion.    Past Medical History:  Diagnosis Date  . Allergy   . Anxiety   . Basal cell carcinoma   . Cardiomyopathy ([place])    EF 35% per pt  . CHF (congestive heart failure) ([employer] [facility])   . Diabetes type 2, uncontrolled ([place])   . Heartburn   . Hypertension      Social History   Social History  . Marital status: Married    Spouse name: N/A  . Number of children: N/A  . Years of education: N/A   Occupational History  . Not on file.   Social History Main Topics  . Smoking status: Former Research scientist (life sciences)  . Smokeless tobacco: Never Used  . Alcohol use 0.6 oz/week    1 Standard drinks or equivalent per week     Comment: twice monthly  . Drug use: No  . Sexual activity: Not on file   Other Topics Concern  . 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   Daughter/fiance and grandson live downstairs   Enjoys beach, decorating/yard work.   One dog       Past Surgical History:  Procedure Laterality Date  . CHOLECYSTECTOMY  2008  . FOOT SURGERY  1982   "benign tumor bottom of right foot"  . NASAL SEPTUM SURGERY  2000  . TONSILLECTOMY  2000  . uterine ablation    . UVULECTOMY  2000    Family History  Problem Relation Age of Onset  . Diabetes Mother   . Hypertension Mother   . Cancer Father     lung  . Emphysema Father   . Diabetes Sister   . Diabetes Brother   . Leukemia Brother   . Stroke Brother   . Heart attack Maternal Uncle   . Breast cancer Paternal Aunt   . Lymphoma Paternal Uncle   . Polycystic ovary syndrome Daughter   . Hypertension Daughter   . Diabetes Mellitus II Maternal Grandmother   . Colon cancer Neg Hx     Allergies  Allergen Reactions  . Lactose Intolerance (Gi)     GI symptoms    Current Outpatient Prescriptions on File Prior to Visit  Medication Sig Dispense Refill  . atorvastatin (LIPITOR) 10 MG tablet Take 1 tablet (10 mg total) by mouth daily. 30 tablet  5  . carvedilol (COREG) 6.25 MG tablet Take 6.25 mg by mouth 2 (two) times daily.    . cetirizine (ZYRTEC) 10 MG tablet Take 10 mg by mouth daily.    . furosemide (LASIX) 40 MG tablet Take 40 mg by mouth daily as needed for edema.     Marland Kitchen ibuprofen (ADVIL,MOTRIN) 200 MG tablet Take 400 mg by mouth every 6 (six) hours as needed for headache or moderate pain.    . isosorbide mononitrate (IMDUR) 30 MG 24 hr tablet Take 30 mg by mouth at bedtime.     Marland Kitchen lisinopril (PRINIVIL,ZESTRIL) 5 MG tablet Take 5 mg by mouth daily.    . meloxicam (MOBIC) 7.5 MG tablet Take 1 tablet (7.5 mg total) by mouth daily. 14 tablet 0  . oxymetazoline (AFRIN NASAL SPRAY) 0.05 % nasal spray Place 2 sprays into both nostrils 2 (two) times daily as needed for congestion.  30 mL 0  . pantoprazole (PROTONIX) 40 MG  tablet Take 1 tablet (40 mg total) by mouth daily. (Patient taking differently: Take 40 mg by mouth at bedtime. ) 30 tablet 3  . potassium chloride (K-DUR,KLOR-CON) 10 MEQ tablet Take 10 mEq by mouth daily as needed (swelling).     . nitroGLYCERIN (NITROSTAT) 0.4 MG SL tablet Place 0.4 mg under the tongue every 5 (five) minutes as needed for chest pain.      No current facility-administered medications on file prior to visit.     BP 120/76 (BP Location: Left Arm, Patient Position: Sitting, Cuff Size: Large)   Temp 98.4 F (36.9 C) (Oral)   Ht 5\' 7"  (1.702 m)   Wt 210 lb (95.3 kg)   BMI 32.89 kg/m      Objective:   Physical Exam  General  Mental Status - Alert. General Appearance - Well groomed. Not in acute distress.  Skin Rashes- No Rashes.  HEENT Head- Normal. Ear Auditory Canal - Left- Normal. Right - Normal.Tympanic Membrane- Left- Normal. Right- Normal. Eye Sclera/Conjunctiva- Left- Normal. Right- Normal. Nose & Sinuses Nasal Mucosa- Left-  Boggy and Congested. Right-  Boggy and  Congested.Bilateral no  maxillary and  No frontal sinus pressure. Mouth & Throat Lips: Upper Lip- Normal: no dryness, cracking, pallor, cyanosis, or vesicular eruption. Lower Lip-Normal: no dryness, cracking, pallor, cyanosis or vesicular eruption. Buccal Mucosa- Bilateral- No Aphthous ulcers. Oropharynx- No Discharge or Erythema. Tonsils: Characteristics- Bilateral- Erythema and Congestion. Size/Enlargement- Bilateral- No enlargement. Discharge- bilateral-None.  Neck Neck- Supple. No Masses.tender submandibular nodes on neck exam.   Chest and Lung Exam Auscultation: Breath Sounds:-Clear even and unlabored.  Cardiovascular Auscultation:Rythm- Regular, rate and rhythm. Murmurs & Other Heart Sounds:Ausculatation of the heart reveal- No Murmurs.  Lymphatic Head & Neck General Head & Neck Lymphatics: Bilateral: Description- tender submandibular nodes on neck exam.       Assessment &  Plan:  Your strep test was positive(faint). I am prescribing augmentin  antibiotic. Rest hydrate, tylenol for fever and warm salt water gargles.   Your flu test was negative today.  You also have some bronchitis type symptoms but want to get cxr as well. Make sure no pneumonia.  For cough rx benzonatate. For nasl congestion fx flonase. For recent mild wheezing rx albuterol.  Please get cbc today as well.   Follow up in 5 days or as needed  If chest xray shows pneumonia may add additional anitbiotic.  See the note made regarding high wbc. Called pt and made plan.  Evian Derringer, [name], PA-C

## 2016-11-04 ENCOUNTER — Other Ambulatory Visit (INDEPENDENT_AMBULATORY_CARE_PROVIDER_SITE_OTHER): Payer: 59

## 2016-11-04 DIAGNOSIS — D72829 Elevated white blood cell count, unspecified: Secondary | ICD-10-CM | POA: Diagnosis not present

## 2016-11-04 LAB — CBC WITH DIFFERENTIAL/PLATELET
Basophils Absolute: 0.1 10*3/uL (ref 0.0–0.1)
Basophils Relative: 0.8 % (ref 0.0–3.0)
Eosinophils Absolute: 0.3 10*3/uL (ref 0.0–0.7)
Eosinophils Relative: 2.4 % (ref 0.0–5.0)
HCT: 43.2 % (ref 36.0–46.0)
Hemoglobin: 14.8 g/dL (ref 12.0–15.0)
Lymphocytes Relative: 27.2 % (ref 12.0–46.0)
Lymphs Abs: 3.3 10*3/uL (ref 0.7–4.0)
MCHC: 34.2 g/dL (ref 30.0–36.0)
MCV: 85.7 fl (ref 78.0–100.0)
Monocytes Absolute: 1.1 10*3/uL — ABNORMAL HIGH (ref 0.1–1.0)
Monocytes Relative: 8.9 % (ref 3.0–12.0)
Neutro Abs: 7.5 10*3/uL (ref 1.4–7.7)
Neutrophils Relative %: 60.7 % (ref 43.0–77.0)
Platelets: 370 10*3/uL (ref 150.0–400.0)
RBC: 5.04 Mil/uL (ref 3.87–5.11)
RDW: 13.4 % (ref 11.5–15.5)
WBC: 12.3 10*3/uL — ABNORMAL HIGH (ref 4.0–10.5)

## 2016-11-11 ENCOUNTER — Other Ambulatory Visit (INDEPENDENT_AMBULATORY_CARE_PROVIDER_SITE_OTHER): Payer: 59

## 2016-11-11 DIAGNOSIS — D72829 Elevated white blood cell count, unspecified: Secondary | ICD-10-CM

## 2016-11-11 LAB — CBC WITH DIFFERENTIAL/PLATELET
Basophils Absolute: 0.1 10*3/uL (ref 0.0–0.1)
Basophils Relative: 0.7 % (ref 0.0–3.0)
Eosinophils Absolute: 0.2 10*3/uL (ref 0.0–0.7)
Eosinophils Relative: 2.4 % (ref 0.0–5.0)
HCT: 39.5 % (ref 36.0–46.0)
Hemoglobin: 13.3 g/dL (ref 12.0–15.0)
Lymphocytes Relative: 33.3 % (ref 12.0–46.0)
Lymphs Abs: 3.1 10*3/uL (ref 0.7–4.0)
MCHC: 33.8 g/dL (ref 30.0–36.0)
MCV: 86.1 fl (ref 78.0–100.0)
Monocytes Absolute: 0.6 10*3/uL (ref 0.1–1.0)
Monocytes Relative: 6.7 % (ref 3.0–12.0)
Neutro Abs: 5.2 10*3/uL (ref 1.4–7.7)
Neutrophils Relative %: 56.9 % (ref 43.0–77.0)
Platelets: 347 10*3/uL (ref 150.0–400.0)
RBC: 4.58 Mil/uL (ref 3.87–5.11)
RDW: 13.4 % (ref 11.5–15.5)
WBC: 9.2 10*3/uL (ref 4.0–10.5)

## 2016-11-14 ENCOUNTER — Telehealth: Payer: Self-pay | Admitting: Gastroenterology

## 2016-11-15 ENCOUNTER — Other Ambulatory Visit: Payer: Self-pay

## 2016-11-15 DIAGNOSIS — Z1211 Encounter for screening for malignant neoplasm of colon: Secondary | ICD-10-CM

## 2016-11-15 NOTE — Telephone Encounter (Signed)
Spoke with the patient. Rescheduled her screening colonoscopy for 01/03/17 with new instructions mailed.

## 2016-11-23 ENCOUNTER — Other Ambulatory Visit: Payer: Self-pay | Admitting: Family

## 2016-11-25 ENCOUNTER — Encounter: Payer: Self-pay | Admitting: Family

## 2016-11-25 ENCOUNTER — Ambulatory Visit (INDEPENDENT_AMBULATORY_CARE_PROVIDER_SITE_OTHER): Payer: 59 | Admitting: Family

## 2016-11-25 ENCOUNTER — Telehealth: Payer: Self-pay | Admitting: Family

## 2016-11-25 VITALS — BP 107/69 | HR 78 | Temp 98.2°F | Resp 16 | Ht 67.0 in | Wt 211.0 lb

## 2016-11-25 DIAGNOSIS — J0100 Acute maxillary sinusitis, unspecified: Secondary | ICD-10-CM | POA: Diagnosis not present

## 2016-11-25 DIAGNOSIS — E1165 Type 2 diabetes mellitus with hyperglycemia: Secondary | ICD-10-CM | POA: Diagnosis not present

## 2016-11-25 DIAGNOSIS — IMO0001 Reserved for inherently not codable concepts without codable children: Secondary | ICD-10-CM

## 2016-11-25 LAB — BASIC METABOLIC PANEL
BUN: 10 mg/dL (ref 6–23)
CO2: 24 mEq/L (ref 19–32)
Calcium: 9.3 mg/dL (ref 8.4–10.5)
Chloride: 104 mEq/L (ref 96–112)
Creatinine, Ser: 0.85 mg/dL (ref 0.40–1.20)
GFR: 73.74 mL/min (ref >60.00)
Glucose, Bld: 177 mg/dL — ABNORMAL HIGH (ref 70–99)
Potassium: 3.7 mEq/L (ref 3.5–5.1)
Sodium: 138 mEq/L (ref 135–145)

## 2016-11-25 LAB — HEMOGLOBIN A1C: Hgb A1c MFr Bld: 8.1 % — ABNORMAL HIGH (ref 4.6–6.5)

## 2016-11-25 MED ORDER — ASPIRIN EC 81 MG PO TBEC: 81.0000 mg | DELAYED_RELEASE_TABLET | Freq: Every day | ORAL | Status: DC

## 2016-11-25 MED ORDER — LEVOFLOXACIN 500 MG PO TABS: 500.0000 mg | ORAL_TABLET | Freq: Every day | ORAL | 0 refills | Status: DC

## 2016-11-25 MED ORDER — METFORMIN HCL 500 MG PO TABS: 500.0000 mg | ORAL_TABLET | Freq: Two times a day (BID) | ORAL | 3 refills | Status: DC

## 2016-11-25 NOTE — Progress Notes (Signed)
Pre visit review using our clinic review tool, if applicable. No additional management support is needed unless otherwise documented below in the visit note. 

## 2016-11-25 NOTE — Progress Notes (Signed)
Subjective:    Patient ID: Katrina Kelly, female    DOB: 1961/03/13, 55 y.o.   MRN: [id number]  HPI  [name] is a 55 yr old female who presents today for follow up.  1) Sinus congestion-  Reports + facial pain. Pain is causing her teeth to "ache."  She denies recent fever. Nasal drainage is yellow/green. Has a mild sore throat. Mild AM cough.  Energy is poor.  She tested positive on 11/01/16 for strep and was treated with augmentin. CXR that day was clear.   2) DM2- reports that she has been trying to cut out concentrated sweets from her diet   Lab Results  Component Value Date   HGBA1C 7.3 (H) 08/15/2016   Lab Results  Component Value Date   LDLCALC 61 08/13/2016   CREATININE 0.82 08/13/2016     Review of Systems See HPI  Past Medical History:  Diagnosis Date  . Allergy   . Anxiety   . Basal cell carcinoma   . Cardiomyopathy ([place])    EF 35% per pt  . CHF (congestive heart failure) ([place])   . Diabetes type 2, uncontrolled ([place])   . Heartburn   . Hypertension      Social History   Social History  . Marital status: Married    Spouse name: N/A  . Number of children: N/A  . Years of education: N/A   Occupational History  . Not on file.   Social History Main Topics  . Smoking status: Former Research scientist (life sciences)  . Smokeless tobacco: Never Used  . Alcohol use 0.6 oz/week    1 Standard drinks or equivalent per week     Comment: twice monthly  . Drug use: No  . Sexual activity: Not on file   Other Topics Concern  . 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   Daughter/fiance and grandson live downstairs   Enjoys beach, decorating/yard work.   One dog       Past Surgical History:  Procedure Laterality Date  . CHOLECYSTECTOMY  2008  . FOOT SURGERY  1982   "benign tumor bottom of right foot"  . NASAL SEPTUM SURGERY  2000  . TONSILLECTOMY  2000  . uterine ablation    . UVULECTOMY   2000    Family History  Problem Relation Age of Onset  . Diabetes Mother   . Hypertension Mother   . Cancer Father     lung  . Emphysema Father   . Diabetes Sister   . Diabetes Brother   . Leukemia Brother   . Stroke Brother   . Heart attack Maternal Uncle   . Breast cancer Paternal Aunt   . Lymphoma Paternal Uncle   . Polycystic ovary syndrome Daughter   . Hypertension Daughter   . Diabetes Mellitus II Maternal Grandmother   . Colon cancer Neg Hx     Allergies  Allergen Reactions  . Lactose Intolerance (Gi)     GI symptoms    Current Outpatient Prescriptions on File Prior to Visit  Medication Sig Dispense Refill  . albuterol (PROVENTIL HFA;VENTOLIN HFA) 108 (90 Base) MCG/ACT inhaler Inhale 2 puffs into the lungs every 6 (six) hours as needed for wheezing or shortness of breath. 1 Inhaler 0  . amoxicillin-clavulanate (AUGMENTIN) 875-125 MG tablet Take 1 tablet by mouth 2 (two) times daily. 20 tablet 0  . atorvastatin (LIPITOR) 10 MG  tablet Take 1 tablet (10 mg total) by mouth daily. 30 tablet 5  . benzonatate (TESSALON) 100 MG capsule Take 1 capsule (100 mg total) by mouth 3 (three) times daily as needed for cough. 21 capsule 0  . carvedilol (COREG) 6.25 MG tablet Take 6.25 mg by mouth 2 (two) times daily.    . cetirizine (ZYRTEC) 10 MG tablet Take 10 mg by mouth daily.    . fluticasone (FLONASE) 50 MCG/ACT nasal spray Place 2 sprays into both nostrils daily. 16 g 1  . furosemide (LASIX) 40 MG tablet Take 40 mg by mouth daily as needed for edema.     Marland Kitchen ibuprofen (ADVIL,MOTRIN) 200 MG tablet Take 400 mg by mouth every 6 (six) hours as needed for headache or moderate pain.    . isosorbide mononitrate (IMDUR) 30 MG 24 hr tablet Take 30 mg by mouth at bedtime.     Marland Kitchen lisinopril (PRINIVIL,ZESTRIL) 5 MG tablet Take 5 mg by mouth daily.    . meloxicam (MOBIC) 7.5 MG tablet Take 1 tablet (7.5 mg total) by mouth daily. 14 tablet 0  . oxymetazoline (AFRIN NASAL SPRAY) 0.05 % nasal  spray Place 2 sprays into both nostrils 2 (two) times daily as needed for congestion.  30 mL 0  . pantoprazole (PROTONIX) 40 MG tablet Take 1 tablet (40 mg total) by mouth daily. (Patient taking differently: Take 40 mg by mouth at bedtime. ) 30 tablet 3  . potassium chloride (K-DUR,KLOR-CON) 10 MEQ tablet Take 10 mEq by mouth daily as needed (swelling).     . nitroGLYCERIN (NITROSTAT) 0.4 MG SL tablet Place 0.4 mg under the tongue every 5 (five) minutes as needed for chest pain.      No current facility-administered medications on file prior to visit.     BP 107/69 (BP Location: Right Arm, Cuff Size: Large)   Pulse 78   Temp 98.2 F (36.8 C) (Oral)   Resp 16   Ht 5\' 7"  (1.702 m)   Wt 211 lb (95.7 kg)   SpO2 98%   BMI 33.05 kg/m       Objective:   Physical Exam  Constitutional: She appears well-developed and well-nourished.  HENT:  Right Ear: Tympanic membrane and ear canal normal.  Left Ear: Tympanic membrane and ear canal normal.  Nose: Right sinus exhibits maxillary sinus tenderness. Right sinus exhibits no frontal sinus tenderness. Left sinus exhibits maxillary sinus tenderness. Left sinus exhibits no frontal sinus tenderness.  Mouth/Throat: No oropharyngeal exudate, posterior oropharyngeal edema or posterior oropharyngeal erythema.  Cardiovascular: Normal rate, regular rhythm and normal heart sounds.   No murmur heard. Pulmonary/Chest: Effort normal and breath sounds normal. No respiratory distress. She has no wheezes.  Psychiatric: She has a normal mood and affect. Her behavior is normal. Judgment and thought content normal.          Assessment & Plan:  sinusitus- will rx with levaquin since she recently received augmentin.  She is advised to call if new/worsening symptoms or if symptoms are not improved in 3 days days.

## 2016-11-25 NOTE — Telephone Encounter (Signed)
Sugar control has worsened. A1C is up to 8.1 from 7.3.  I would like her to add metformin twice daily and continue to work on diabetic diet, exercise, and weight loss.

## 2016-11-25 NOTE — Assessment & Plan Note (Signed)
Will obtain follow up A1C. Already on ACE, add aspirin 81mg  once daily. Obtain follow up A1C.   LDL at goal.

## 2016-11-25 NOTE — Telephone Encounter (Signed)
Notified pt and she is agreeable to proceed with medication. 

## 2016-11-25 NOTE — Patient Instructions (Addendum)
Begin levaquin for sinus infection. Complete lab work prior to leaving. Add aspirin 81mg  once daily for prevention of heart attack and stroke.    Sinusitis, Adult Sinusitis is soreness and inflammation of your sinuses. Sinuses are hollow spaces in the bones around your face. Your sinuses are located:  Around your eyes.  In the middle of your forehead.  Behind your nose.  In your cheekbones. Your sinuses and nasal passages are lined with a stringy fluid (mucus). Mucus normally drains out of your sinuses. When your nasal tissues become inflamed or swollen, the mucus can become trapped or blocked so air cannot flow through your sinuses. This allows bacteria, viruses, and funguses to grow, which leads to infection. Sinusitis can develop quickly and last for 7?10 days (acute) or for more than 12 weeks (chronic). Sinusitis often develops after a cold. What are the causes? This condition is caused by anything that creates swelling in the sinuses or stops mucus from draining, including:  Allergies.  Asthma.  Bacterial or viral infection.  Abnormally shaped bones between the nasal passages.  Nasal growths that contain mucus (nasal polyps).  Narrow sinus openings.  Pollutants, such as chemicals or irritants in the air.  A foreign object stuck in the nose.  A fungal infection. This is rare. What increases the risk? The following factors may make you more likely to develop this condition:  Having allergies or asthma.  Having had a recent cold or respiratory tract infection.  Having structural deformities or blockages in your nose or sinuses.  Having a weak immune system.  Doing a lot of swimming or diving.  Overusing nasal sprays.  Smoking. What are the signs or symptoms? The main symptoms of this condition are pain and a feeling of pressure around the affected sinuses. Other symptoms include:  Upper toothache.  Earache.  Headache.  Bad breath.  Decreased sense of  smell and taste.  A cough that may get worse at night.  Fatigue.  Fever.  Thick drainage from your nose. The drainage is often green and it may contain pus (purulent).  Stuffy nose or congestion.  Postnasal drip. This is when extra mucus collects in the throat or back of the nose.  Swelling and warmth over the affected sinuses.  Sore throat.  Sensitivity to light. How is this diagnosed? This condition is diagnosed based on symptoms, a medical history, and a physical exam. To find out if your condition is acute or chronic, your health care provider may:  Look in your nose for signs of nasal polyps.  Tap over the affected sinus to check for signs of infection.  View the inside of your sinuses using an imaging device that has a light attached (endoscope). If your health care provider suspects that you have chronic sinusitis, you may also:  Be tested for allergies.  Have a sample of mucus taken from your nose (nasal culture) and checked for bacteria.  Have a mucus sample examined to see if your sinusitis is related to an allergy. If your sinusitis does not respond to treatment and it lasts longer than 8 weeks, you may have an MRI or CT scan to check your sinuses. These scans also help to determine how severe your infection is. In rare cases, a bone biopsy may be done to rule out more serious types of fungal sinus disease. How is this treated? Treatment for sinusitis depends on the cause and whether your condition is chronic or acute. If a virus is causing your sinusitis,  your symptoms will go away on their own within 10 days. You may be given medicines to relieve your symptoms, including:  Topical nasal decongestants. They shrink swollen nasal passages and let mucus drain from your sinuses.  Antihistamines. These drugs block inflammation that is triggered by allergies. This can help to ease swelling in your nose and sinuses.  Topical nasal corticosteroids. These are nasal  sprays that ease inflammation and swelling in your nose and sinuses.  Nasal saline washes. These rinses can help to get rid of thick mucus in your nose. If your condition is caused by bacteria, you will be given an antibiotic medicine. If your condition is caused by a fungus, you will be given an antifungal medicine. Surgery may be needed to correct underlying conditions, such as narrow nasal passages. Surgery may also be needed to remove polyps. Follow these instructions at home: Medicines  Take, use, or apply over-the-counter and prescription medicines only as told by your health care provider. These may include nasal sprays.  If you were prescribed an antibiotic medicine, take it as told by your health care provider. Do not stop taking the antibiotic even if you start to feel better. Hydrate and Humidify  Drink enough water to keep your urine clear or pale yellow. Staying hydrated will help to thin your mucus.  Use a cool mist humidifier to keep the humidity level in your home above 50%.  Inhale steam for 10-15 minutes, 3-4 times a day or as told by your health care provider. You can do this in the bathroom while a hot shower is running.  Limit your exposure to cool or dry air. Rest  Rest as much as possible.  Sleep with your head raised (elevated).  Make sure to get enough sleep each night. General instructions  Apply a warm, moist washcloth to your face 3-4 times a day or as told by your health care provider. This will help with discomfort.  Wash your hands often with soap and water to reduce your exposure to viruses and other germs. If soap and water are not available, use hand sanitizer.  Do not smoke. Avoid being around people who are smoking (secondhand smoke).  Keep all follow-up visits as told by your health care provider. This is important. Contact a health care provider if:  You have a fever.  Your symptoms get worse.  Your symptoms do not improve within 10  days. Get help right away if:  You have a severe headache.  You have persistent vomiting.  You have pain or swelling around your face or eyes.  You have vision problems.  You develop confusion.  Your neck is stiff.  You have trouble breathing. This information is not intended to replace advice given to you by your health care provider. Make sure you discuss any questions you have with your health care provider. Document Released: 12/02/2005 Document Revised: 07/28/2016 Document Reviewed: 09/27/2015 Elsevier Interactive Patient Education  2017 [employer].

## 2016-12-20 ENCOUNTER — Telehealth: Payer: Self-pay | Admitting: Family

## 2016-12-20 NOTE — Telephone Encounter (Signed)
Notified pt and she voices understanding. No availability in our office this afternoon. Pt did not want to go to another facility today. Pt states she will call back Monday if still not feeling better. Also provided pt with number for [facility] if she needs to be seen over the weekend.

## 2016-12-20 NOTE — Telephone Encounter (Signed)
Needs OV first please.  

## 2016-12-20 NOTE — Telephone Encounter (Signed)
Patient called stating that she has finished her antibiotics that were prescribed at her appointment on 11/25/16 and she still feels like she has a sinus infection. She would like to get a refill of the antibiotics. Please advise.   Phone: [phone]  Pharmacy: [place], [place] - [address] AT [address]

## 2016-12-22 ENCOUNTER — Other Ambulatory Visit: Payer: Self-pay | Admitting: Family

## 2016-12-25 ENCOUNTER — Encounter: Payer: Self-pay | Admitting: Medical

## 2016-12-25 ENCOUNTER — Ambulatory Visit (INDEPENDENT_AMBULATORY_CARE_PROVIDER_SITE_OTHER): Payer: 59 | Admitting: Medical

## 2016-12-25 VITALS — BP 106/67 | HR 90 | Temp 97.9°F | Resp 16 | Ht 67.0 in | Wt 210.2 lb

## 2016-12-25 DIAGNOSIS — J0101 Acute recurrent maxillary sinusitis: Secondary | ICD-10-CM

## 2016-12-25 MED ORDER — CEFTRIAXONE SODIUM 1 G IJ SOLR
1.0000 g | Freq: Once | INTRAMUSCULAR | Status: AC
  Administered 2016-12-25: 1 g via INTRAMUSCULAR

## 2016-12-25 MED ORDER — LEVOFLOXACIN 500 MG PO TABS: 500.0000 mg | ORAL_TABLET | Freq: Every day | ORAL | 0 refills | Status: DC

## 2016-12-25 NOTE — Patient Instructions (Addendum)
For your persisting vs recurrent sinus infection we gave rocephin 1 gram im today. Rx levofloxin for 10 days. Update me in 10 days or sooner. If in 10 days some residual pressure may extend levofloxin up to total 14 days treatment. But if by day 14 any persisting symptoms would consider imaging studies to evaluate sinus area and referral to ent. Use flonase in am and netty pot rinse at night.  Please sign release of information form so we can get prior ENT records  Use probiotics over next month to protect against c dif.  Follow up in 2 weeks or as needed

## 2016-12-25 NOTE — Progress Notes (Signed)
   Subjective:    Patient ID: Katrina Kelly, female    DOB: [birth year], 56 y.o.   MRN: [id number]  HPI  Pt in for sinus pain and pressure on and off for a month. Pt was treated with levofloxin. She felt better briefly then last 10-14 days sinus pressure returned. Prior to levofloxin was on augmentin.  Pt has some sinus pain and blowing out mucous from her nose daily fo past 10 days. Pt has history or surgery for deviated septum repair years ago.   Pt in the past reports that in the past her ent had her on a prescription nasal wash to irrigate the sinus. Pt no longer dose this. Pt has not been doing netty pot treatments.  Patient reported back to back rounds of levofloxin needed to get over infection.     Review of Systems  Constitutional: Negative for chills, fatigue and fever.  HENT: Positive for congestion, sinus pain and sinus pressure. Negative for ear pain and rhinorrhea.   Respiratory: Negative for cough, choking, chest tightness, shortness of breath and wheezing.   Cardiovascular: Negative for chest pain and palpitations.  Gastrointestinal: Negative for abdominal pain, diarrhea and nausea.  Musculoskeletal: Negative for back pain.  Skin: Negative for rash.  Neurological: Negative for dizziness and headaches.  Hematological: Negative for adenopathy. Does not bruise/bleed easily.  Psychiatric/Behavioral: Negative for behavioral problems and confusion.       Objective:   Physical Exam  General  Mental Status - Alert. General Appearance - Well groomed. Not in acute distress.  Skin Rashes- No Rashes.  HEENT Head- Normal. Ear Auditory Canal - Left- Normal. Right - Normal.Tympanic Membrane- Left- Normal. Right- Normal. Eye Sclera/Conjunctiva- Left- Normal. Right- Normal. Nose & Sinuses Nasal Mucosa- Left-  Boggy and Congested. Right-  Boggy and  Congested.Bilateral maxillary sinus pressure.  frontal sinus pressure less than maxillary. Mouth & Throat Lips: Upper Lip-  Normal: no dryness, cracking, pallor, cyanosis, or vesicular eruption. Lower Lip-Normal: no dryness, cracking, pallor, cyanosis or vesicular eruption. Buccal Mucosa- Bilateral- No Aphthous ulcers. Oropharynx- No Discharge or Erythema. Tonsils: Characteristics- Bilateral- No Erythema or Congestion. Size/Enlargement- Bilateral- No enlargement. Discharge- bilateral-None.  Neck Neck- Supple. No Masses.   Chest and Lung Exam Auscultation: Breath Sounds:-Clear even and unlabored.  Cardiovascular Auscultation:Rythm- Regular, rate and rhythm. Murmurs & Other Heart Sounds:Ausculatation of the heart reveal- No Murmurs.  Lymphatic Head & Neck General Head & Neck Lymphatics: Bilateral: Description- No Localized lymphadenopathy.       Assessment & Plan:  For your persisting vs recurrent sinus infection we gave rocephin 1 gram im today. Rx levofloxin for 10 days. Update me in 10 days or sooner. If in 10 days some residual pressure may extend levofloxin up to total 14 days treatment. But if by day 14 any persisting symptoms would consider imaging studies to evaluate sinus area and referral to ent. Use flonase in am and netty pot rinse at night.  Please sign release of information form so we can get prior ENT records  Use probiotics over next month to protect against c dif.  Follow up in 2 weeks or as needed  Katrina Kelly, [name], [name]

## 2016-12-25 NOTE — Progress Notes (Signed)
Pre visit review using our clinic review tool, if applicable. No additional management support is needed unless otherwise documented below in the visit note/SLS  

## 2016-12-31 ENCOUNTER — Encounter (HOSPITAL_COMMUNITY): Payer: Self-pay | Admitting: *Deleted

## 2017-01-03 ENCOUNTER — Ambulatory Visit (HOSPITAL_COMMUNITY): Payer: 59 | Admitting: Certified Registered Nurse Anesthetist

## 2017-01-03 ENCOUNTER — Ambulatory Visit (HOSPITAL_COMMUNITY)
Admission: RE | Admit: 2017-01-03 | Discharge: 2017-01-03 | Disposition: A | Discharge status: Home / Self Care | Payer: 59 | Source: Ambulatory Visit | Attending: Gastroenterology | Admitting: Gastroenterology

## 2017-01-03 ENCOUNTER — Encounter (HOSPITAL_COMMUNITY): Payer: Self-pay

## 2017-01-03 ENCOUNTER — Encounter (HOSPITAL_COMMUNITY)
Admission: RE | Disposition: A | Discharge status: Home / Self Care | Payer: Self-pay | Source: Ambulatory Visit | Attending: Gastroenterology

## 2017-01-03 DIAGNOSIS — I110 Hypertensive heart disease with heart failure: Secondary | ICD-10-CM | POA: Diagnosis not present

## 2017-01-03 DIAGNOSIS — Z1212 Encounter for screening for malignant neoplasm of rectum: Secondary | ICD-10-CM

## 2017-01-03 DIAGNOSIS — Z87891 Personal history of nicotine dependence: Secondary | ICD-10-CM | POA: Diagnosis not present

## 2017-01-03 DIAGNOSIS — Z7982 Long term (current) use of aspirin: Secondary | ICD-10-CM | POA: Insufficient documentation

## 2017-01-03 DIAGNOSIS — D125 Benign neoplasm of sigmoid colon: Secondary | ICD-10-CM | POA: Diagnosis not present

## 2017-01-03 DIAGNOSIS — K635 Polyp of colon: Secondary | ICD-10-CM | POA: Diagnosis not present

## 2017-01-03 DIAGNOSIS — K648 Other hemorrhoids: Secondary | ICD-10-CM | POA: Diagnosis not present

## 2017-01-03 DIAGNOSIS — Z791 Long term (current) use of non-steroidal anti-inflammatories (NSAID): Secondary | ICD-10-CM | POA: Insufficient documentation

## 2017-01-03 DIAGNOSIS — Z1211 Encounter for screening for malignant neoplasm of colon: Secondary | ICD-10-CM | POA: Diagnosis not present

## 2017-01-03 DIAGNOSIS — E119 Type 2 diabetes mellitus without complications: Secondary | ICD-10-CM | POA: Diagnosis not present

## 2017-01-03 DIAGNOSIS — I509 Heart failure, unspecified: Secondary | ICD-10-CM | POA: Diagnosis not present

## 2017-01-03 DIAGNOSIS — Z79899 Other long term (current) drug therapy: Secondary | ICD-10-CM | POA: Diagnosis not present

## 2017-01-03 DIAGNOSIS — Z7984 Long term (current) use of oral hypoglycemic drugs: Secondary | ICD-10-CM | POA: Insufficient documentation

## 2017-01-03 DIAGNOSIS — K621 Rectal polyp: Secondary | ICD-10-CM | POA: Diagnosis not present

## 2017-01-03 DIAGNOSIS — D124 Benign neoplasm of descending colon: Secondary | ICD-10-CM | POA: Diagnosis not present

## 2017-01-03 HISTORY — PX: COLONOSCOPY WITH PROPOFOL: SHX5780

## 2017-01-03 LAB — GLUCOSE, CAPILLARY
Glucose-Capillary: 150 mg/dL — ABNORMAL HIGH (ref 65–99)
Glucose-Capillary: 118 mg/dL — ABNORMAL HIGH (ref 65–99)

## 2017-01-03 SURGERY — COLONOSCOPY WITH PROPOFOL
Anesthesia: Monitor Anesthesia Care

## 2017-01-03 MED ORDER — LIDOCAINE 2% (20 MG/ML) 5 ML SYRINGE
INTRAMUSCULAR | Status: AC
  Filled 2017-01-03: qty 5

## 2017-01-03 MED ORDER — PROPOFOL 10 MG/ML IV BOLUS
INTRAVENOUS | Status: AC
  Filled 2017-01-03: qty 20

## 2017-01-03 MED ORDER — SODIUM CHLORIDE 0.9 % IV SOLN: INTRAVENOUS | Status: DC

## 2017-01-03 MED ORDER — PROPOFOL 10 MG/ML IV BOLUS
INTRAVENOUS | Status: DC | PRN
  Administered 2017-01-03: 10 mg via INTRAVENOUS
  Administered 2017-01-03: 20 mg via INTRAVENOUS

## 2017-01-03 MED ORDER — LACTATED RINGERS IV SOLN
INTRAVENOUS | Status: DC
Start: 2017-01-03 — End: 2017-01-03
  Administered 2017-01-03: 1000 mL via INTRAVENOUS

## 2017-01-03 MED ORDER — PROPOFOL 500 MG/50ML IV EMUL
INTRAVENOUS | Status: DC | PRN
  Administered 2017-01-03: 150 ug/kg/min via INTRAVENOUS

## 2017-01-03 MED ORDER — LABETALOL HCL 5 MG/ML IV SOLN
INTRAVENOUS | Status: DC | PRN
  Administered 2017-01-03: 5 mg via INTRAVENOUS

## 2017-01-03 MED ORDER — PROPOFOL 10 MG/ML IV BOLUS
INTRAVENOUS | Status: AC
  Filled 2017-01-03: qty 40

## 2017-01-03 MED ORDER — LIDOCAINE 2% (20 MG/ML) 5 ML SYRINGE
INTRAMUSCULAR | Status: DC | PRN
  Administered 2017-01-03: 40 mg via INTRAVENOUS

## 2017-01-03 SURGICAL SUPPLY — 21 items

## 2017-01-03 NOTE — Transfer of Care (Signed)
Immediate Anesthesia Transfer of Care Note  Patient: Katrina Kelly  Procedure(s) Performed: Procedure(s): COLONOSCOPY WITH PROPOFOL (N/A)  Patient Location: PACU  Anesthesia Type:MAC  Level of Consciousness: Patient easily awoken, sedated, comfortable, cooperative, following commands, responds to stimulation.   Airway & Oxygen Therapy: Patient spontaneously breathing, ventilating well, oxygen via simple oxygen mask.  Post-op Assessment: Report given to PACU RN, vital signs reviewed and stable, moving all extremities.   Post vital signs: Reviewed and stable.  Complications: No apparent anesthesia complications Last Vitals:  Vitals:   01/03/17 0932  BP: 130/80  Pulse: 92  Resp: 16  Temp: 36.4 C    Last Pain:  Vitals:   01/03/17 0932  TempSrc: Oral         Complications: No apparent anesthesia complications

## 2017-01-03 NOTE — Op Note (Addendum)
[facility] Patient Name: Katrina Kelly Procedure Date: 01/03/2017 MRN: [id number] Attending MD: Mauri Pole , MD Date of Birth: Jan 17, 1961 CSN: [id number] Age: 56 Admit Type: Outpatient Procedure:                Colonoscopy Indications:              Screening for malignant neoplasm in the colon, Last                            colonoscopy: 2007 Providers:                Mauri Pole, MD, Hilma Favors, RN, Cherylynn Ridges, Technician, [address]. Zenia Resides CRNA, CRNA Referring MD:              Medicines:                Monitored Anesthesia Care Complications:            No immediate complications. Estimated Blood Loss:     Estimated blood loss was minimal. Procedure:                Pre-Anesthesia Assessment:                           - Prior to the procedure, a History and Physical                            was performed, and patient medications and                            allergies were reviewed. The patient's tolerance of                            previous anesthesia was also reviewed. The risks                            and benefits of the procedure and the sedation                            options and risks were discussed with the patient.                            All questions were answered, and informed consent                            was obtained. Prior Anticoagulants: The patient has                            taken no previous anticoagulant or antiplatelet                            agents. ASA Grade Assessment: II - A patient with  mild systemic disease. After reviewing the risks                            and benefits, the patient was deemed in                            satisfactory condition to undergo the procedure.                           After obtaining informed consent, the colonoscope                            was passed under direct vision. Throughout the           procedure, the patient's blood pressure, pulse, and                            oxygen saturations were monitored continuously. The                            EC-[id number]) scope was introduced through                            the anus and advanced to the the cecum, identified                            by appendiceal orifice and ileocecal valve. The                            quality of the bowel preparation was excellent. The                            ileocecal valve, appendiceal orifice, and rectum                            were photographed. Scope In: 11:14:43 AM Scope Out: 11:45:37 AM Scope Withdrawal Time: 0 hours 22 minutes 16 seconds  Total Procedure Duration: 0 hours 30 minutes 54 seconds  Findings:      The perianal and digital rectal examinations were normal.      A 2 mm polyp was found in the descending colon. The polyp was sessile.       The polyp was removed with a cold biopsy forceps. Resection and       retrieval were complete.      Eight sessile polyps were found in the rectum X3, sigmoid colon X4 and       descending colon X1. The polyps were 3 to 8 mm in size. These polyps       were removed with a cold snare. Resection and retrieval were complete.      Non-bleeding internal hemorrhoids were found during retroflexion. The       hemorrhoids were medium-sized. Impression:               - One 2 mm polyp in the descending colon, removed  with a cold biopsy forceps. Resected and retrieved.                           - Eight 3 to 8 mm polyps in the rectum, in the                            sigmoid colon and in the descending colon, removed                            with a cold snare. Resected and retrieved.                           - Non-bleeding internal hemorrhoids. Moderate Sedation:      N/A- Per Anesthesia Care Recommendation:           - Patient has a contact number available for                            emergencies. The  signs and symptoms of potential                            delayed complications were discussed with the                            patient. Return to normal activities tomorrow.                            Written discharge instructions were provided to the                            patient.                           - Resume previous diet.                           - Continue present medications.                           - Await pathology results.                           - Repeat colonoscopy in 3 - 5 years for                            surveillance based on pathology results.                           - Return to GI clinic PRN. Procedure Code(s):        --- Professional ---                           [phone], Colonoscopy, flexible; with removal of                            tumor(s), polyp(s),  or other lesion(s) by snare                            technique                           45380, 59, Colonoscopy, flexible; with biopsy,                            single or multiple Diagnosis Code(s):        --- Professional ---                           Z12.11, Encounter for screening for malignant                            neoplasm of colon                           D12.4, Benign neoplasm of descending colon                           K62.1, Rectal polyp                           D12.5, Benign neoplasm of sigmoid colon                           K64.8, Other hemorrhoids CPT copyright 2016 American Medical Association. All rights reserved. The codes documented in this report are preliminary and upon coder review may  be revised to meet current compliance requirements. Mauri Pole, MD 01/03/2017 11:56:53 AM This report has been signed electronically. Number of Addenda: 0

## 2017-01-03 NOTE — Anesthesia Preprocedure Evaluation (Deleted)
Anesthesia Evaluation  Patient identified by MRN, date of birth, ID band Patient awake and Patient unresponsive    Reviewed: Allergy & Precautions, NPO status , Patient's Chart, lab work & pertinent test results  History of Anesthesia Complications Negative for: history of anesthetic complications  Airway        Dental   Pulmonary former smoker,    breath sounds clear to auscultation       Cardiovascular hypertension, +CHF   Rhythm:Regular Rate:Normal     Neuro/Psych    GI/Hepatic GERD  ,  Endo/Other  diabetesMorbid obesity  Renal/GU      Musculoskeletal   Abdominal   Peds  Hematology   Anesthesia Other Findings   Reproductive/Obstetrics                             Anesthesia Physical Anesthesia Plan  ASA: III  Anesthesia Plan: MAC   Post-op Pain Management:    Induction: Intravenous  Airway Management Planned: Simple Face Mask and Natural Airway  Additional Equipment:   Intra-op Plan:   Post-operative Plan:   Informed Consent: I have reviewed the patients History and Physical, chart, labs and discussed the procedure including the risks, benefits and alternatives for the proposed anesthesia with the patient or authorized representative who has indicated his/her understanding and acceptance.   Dental advisory given  Plan Discussed with:   Anesthesia Plan Comments:         Anesthesia Quick Evaluation

## 2017-01-03 NOTE — Discharge Instructions (Signed)
YOU HAD AN ENDOSCOPIC PROCEDURE TODAY: Refer to the procedure report and other information in the discharge instructions given to you for any specific questions about what was found during the examination. If this information does not answer your questions, please call [place] office at [phone] to clarify.  ° °YOU SHOULD EXPECT: Some feelings of bloating in the abdomen. Passage of more gas than usual. Walking can help get rid of the air that was put into your GI tract during the procedure and reduce the bloating. If you had a lower endoscopy (such as a colonoscopy or flexible sigmoidoscopy) you may notice spotting of blood in your stool or on the toilet paper. Some abdominal soreness may be present for a day or two, also. ° °DIET: Your first meal following the procedure should be a light meal and then it is ok to progress to your normal diet. A half-sandwich or bowl of soup is an example of a good first meal. Heavy or fried foods are harder to digest and may make you feel nauseous or bloated. Drink plenty of fluids but you should avoid alcoholic beverages for 24 hours. If you had a esophageal dilation, please see attached instructions for diet.   ° °ACTIVITY: Your care partner should take you home directly after the procedure. You should plan to take it easy, moving slowly for the rest of the day. You can resume normal activity the day after the procedure however YOU SHOULD NOT DRIVE, use power tools, machinery or perform tasks that involve climbing or major physical exertion for 24 hours (because of the sedation medicines used during the test).  ° °SYMPTOMS TO REPORT IMMEDIATELY: °A gastroenterologist can be reached at any hour. Please call [phone]  for any of the following symptoms:  °Following lower endoscopy (colonoscopy, flexible sigmoidoscopy) °Excessive amounts of blood in the stool  °Significant tenderness, worsening of abdominal pains  °Swelling of the abdomen that is new, acute  °Fever of 100° or  higher  °Following upper endoscopy (EGD, EUS, ERCP, esophageal dilation) °Vomiting of blood or coffee ground material  °New, significant abdominal pain  °New, significant chest pain or pain under the shoulder blades  °Painful or persistently difficult swallowing  °New shortness of breath  °Black, tarry-looking or red, bloody stools ° °FOLLOW UP:  °If any biopsies were taken you will be contacted by phone or by letter within the next 1-3 weeks. Call [phone]  if you have not heard about the biopsies in 3 weeks.  °Please also call with any specific questions about appointments or follow up tests. ° °

## 2017-01-03 NOTE — H&P (Signed)
[facility] History and Physical   Primary Care Physician:  Nance Pear., NP   Reason for Procedure:  Colorectal cancer screening  Plan:    Colonoscopy with possible intervention    HPI: Katrina Kelly is a 56 y.o. female with severe CHF , DM, HTN here for screening colonoscopy. Denies any nausea, vomiting, abdominal pain, melena or bright red blood per rectum    Past Medical History:  Diagnosis Date  . Allergy   . Anxiety   . Basal cell carcinoma    skin   . Cardiomyopathy ([place])    EF 35% per pt  . CHF (congestive heart failure) ([place])   . Diabetes type 2, uncontrolled ([place])   . Heartburn   . Hypertension     Past Surgical History:  Procedure Laterality Date  . CHOLECYSTECTOMY  2008  . FOOT SURGERY  1982   "benign tumor bottom of right foot"  . NASAL SEPTUM SURGERY  2000  . TONSILLECTOMY  2000  . uterine ablation    . UVULECTOMY  2000    Prior to Admission medications   Medication Sig Start Date End Date Taking? Authorizing Provider  atorvastatin (LIPITOR) 10 MG tablet TAKE 1 TABLET(10 MG) BY MOUTH DAILY 12/23/16  Yes Debbrah Alar, NP  carvedilol (COREG) 6.25 MG tablet Take 6.25 mg by mouth 2 (two) times daily. 05/02/16  Yes Historical Provider, MD  cetirizine (ZYRTEC) 10 MG tablet Take 10 mg by mouth daily. 11/02/10  Yes Historical Provider, MD  fluticasone (FLONASE) 50 MCG/ACT nasal spray Place 2 sprays into both nostrils daily. Patient taking differently: Place 2 sprays into both nostrils daily as needed for allergies.  11/01/16  Yes Edward Saguier, PA-C  furosemide (LASIX) 40 MG tablet Take 40 mg by mouth daily as needed for edema.    Yes Historical Provider, MD  ibuprofen (ADVIL,MOTRIN) 200 MG tablet Take 400 mg by mouth every 6 (six) hours as needed for headache or moderate pain.   Yes Historical Provider, MD  isosorbide mononitrate (IMDUR) 30 MG 24 hr tablet Take 30 mg by mouth at bedtime.  05/02/16  Yes Historical Provider, MD   levofloxacin (LEVAQUIN) 500 MG tablet Take 1 tablet (500 mg total) by mouth daily. 12/25/16  Yes Edward Saguier, PA-C  lisinopril (PRINIVIL,ZESTRIL) 5 MG tablet Take 5 mg by mouth daily. 05/02/16  Yes Historical Provider, MD  metFORMIN (GLUCOPHAGE) 500 MG tablet Take 1 tablet (500 mg total) by mouth 2 (two) times daily with a meal. 11/25/16  Yes Debbrah Alar, NP  oxymetazoline (AFRIN NASAL SPRAY) 0.05 % nasal spray Place 2 sprays into both nostrils 2 (two) times daily as needed for congestion.  05/10/16  Yes Lucretia Kern, DO  pantoprazole (PROTONIX) 40 MG tablet TAKE 1 TABLET(40 MG) BY MOUTH DAILY Patient taking differently: TAKE 1 TABLET(40 MG) BY MOUTH DAILY AT NIGHT 12/23/16  Yes Debbrah Alar, NP  potassium chloride (K-DUR,KLOR-CON) 10 MEQ tablet Take 10 mEq by mouth daily as needed (swelling).    Yes Historical Provider, MD  albuterol (PROVENTIL HFA;VENTOLIN HFA) 108 (90 Base) MCG/ACT inhaler Inhale 2 puffs into the lungs every 6 (six) hours as needed for wheezing or shortness of breath. Patient not taking: Reported on 12/30/2016 11/01/16   Mackie Pai, PA-C  aspirin EC 81 MG tablet Take 1 tablet (81 mg total) by mouth daily. Patient not taking: Reported on 12/30/2016 11/25/16   Debbrah Alar, NP  benzonatate (TESSALON) 100 MG capsule Take 1 capsule (100 mg total) by mouth 3 (three)  times daily as needed for cough. Patient not taking: Reported on 12/30/2016 11/01/16   Mackie Pai, PA-C  nitroGLYCERIN (NITROSTAT) 0.4 MG SL tablet Place 0.4 mg under the tongue every 5 (five) minutes as needed for chest pain.  07/14/15 12/25/17  Historical Provider, MD    Current Facility-Administered Medications  Medication Dose Route Frequency Provider Last Rate Last Dose  . 0.9 %  sodium chloride infusion   Intravenous Continuous Kenna Kirn V, MD      . lactated ringers infusion   Intravenous Continuous Harl Bowie V, MD 125 mL/hr at 01/03/17 0939 1,000 mL at 01/03/17 0939     Allergies as of 11/15/2016 - Review Complete 11/01/2016  Allergen Reaction Noted  . Lactose intolerance (gi)  09/30/2016    Family History  Problem Relation Age of Onset  . Diabetes Mother   . Hypertension Mother   . Cancer Father     lung  . Emphysema Father   . Diabetes Sister   . Diabetes Brother   . Leukemia Brother   . Stroke Brother   . Heart attack Maternal Uncle   . Breast cancer Paternal Aunt   . Lymphoma Paternal Uncle   . Polycystic ovary syndrome Daughter   . Hypertension Daughter   . Diabetes Mellitus II Maternal Grandmother   . Colon cancer Neg Hx     Social History   Social History  . Marital status: Married    Spouse name: N/A  . Number of children: N/A  . Years of education: N/A   Occupational History  . Not on file.   Social History Main Topics  . Smoking status: Former Research scientist (life sciences)  . Smokeless tobacco: Never Used  . Alcohol use 0.6 oz/week    1 Standard drinks or equivalent per week     Comment: twice monthly  . Drug use: No  . Sexual activity: Not on file   Other Topics Concern  . 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   Daughter/fiance and grandson live downstairs   Enjoys beach, decorating/yard work.   One dog       Review of Systems:  All other review of systems negative except as mentioned in the HPI.  Physical Exam: Vital signs in last 24 hours: Temp:  [97.6 F (36.4 C)] 97.6 F (36.4 C) (01/19 0932) Pulse Rate:  [92] 92 (01/19 0932) Resp:  [16] 16 (01/19 0932) BP: (130)/(80) 130/80 (01/19 0932) SpO2:  [96 %] 96 % (01/19 0932) Weight:  [210 lb (95.3 kg)] 210 lb (95.3 kg) (01/19 0932)   General:   Alert,  Well-developed, well-nourished, pleasant and cooperative in NAD Lungs:  Clear throughout to auscultation.   Heart:  Regular rate and rhythm; no murmurs, clicks, rubs,  or gallops. Abdomen:  Soft, nontender and nondistended. Normal  bowel sounds.   Neuro/Psych:  Alert and cooperative. Normal mood and affect. A and O x 3   @K .Denzil Magnuson, MD [phone] Mon-Fri 8a-5p [phone] after 5p, weekends, holidays 01/03/2017 10:41 AM@

## 2017-01-03 NOTE — Anesthesia Preprocedure Evaluation (Signed)
Anesthesia Evaluation  Patient identified by MRN, date of birth, ID band Patient awake    History of Anesthesia Complications Negative for: history of anesthetic complications  Airway Mallampati: II  TM Distance: >3 FB Neck ROM: Full    Dental  (+) Teeth Intact   Pulmonary former smoker,    breath sounds clear to auscultation       Cardiovascular hypertension, +CHF   Rhythm:Regular Rate:Normal     Neuro/Psych    GI/Hepatic   Endo/Other  diabetes  Renal/GU      Musculoskeletal   Abdominal   Peds  Hematology   Anesthesia Other Findings   Reproductive/Obstetrics                             Anesthesia Physical Anesthesia Plan  ASA: II  Anesthesia Plan: MAC   Post-op Pain Management:    Induction: Intravenous  Airway Management Planned: Simple Face Mask and Natural Airway  Additional Equipment:   Intra-op Plan:   Post-operative Plan:   Informed Consent: I have reviewed the patients History and Physical, chart, labs and discussed the procedure including the risks, benefits and alternatives for the proposed anesthesia with the patient or authorized representative who has indicated his/her understanding and acceptance.   Dental advisory given  Plan Discussed with:   Anesthesia Plan Comments:         Anesthesia Quick Evaluation

## 2017-01-03 NOTE — Anesthesia Postprocedure Evaluation (Addendum)
Anesthesia Post Note  Patient: Katrina Kelly  Procedure(s) Performed: Procedure(s) (LRB): COLONOSCOPY WITH PROPOFOL (N/A)  Patient location during evaluation: Endoscopy Anesthesia Type: MAC Level of consciousness: awake and alert Pain management: pain level controlled Vital Signs Assessment: post-procedure vital signs reviewed and stable Respiratory status: spontaneous breathing, nonlabored ventilation, respiratory function stable and patient connected to nasal cannula oxygen Cardiovascular status: stable and blood pressure returned to baseline Anesthetic complications: no       Last Vitals:  Vitals:   01/03/17 0932 01/03/17 1152  BP: 130/80 (P) 117/66  Pulse: 92   Resp: 16 (P) 16  Temp: 36.4 C (P) 36.1 C    Last Pain:  Vitals:   01/03/17 1152  TempSrc: (P) Oral                 ROSE,GEORGE S

## 2017-01-06 ENCOUNTER — Encounter (HOSPITAL_COMMUNITY): Payer: Self-pay | Admitting: Gastroenterology

## 2017-01-08 ENCOUNTER — Encounter: Payer: Self-pay | Admitting: Gastroenterology

## 2017-01-12 LAB — HM DIABETES EYE EXAM

## 2017-02-03 ENCOUNTER — Ambulatory Visit (INDEPENDENT_AMBULATORY_CARE_PROVIDER_SITE_OTHER): Payer: 59 | Admitting: Family

## 2017-02-03 ENCOUNTER — Encounter: Payer: Self-pay | Admitting: Family

## 2017-02-03 VITALS — BP 112/74 | HR 111 | Temp 97.6°F | Ht 67.0 in | Wt 203.8 lb

## 2017-02-03 DIAGNOSIS — A084 Viral intestinal infection, unspecified: Secondary | ICD-10-CM

## 2017-02-03 NOTE — Patient Instructions (Signed)
Please drink plenty of fluids.  Call if recurrent symptoms.    Viral Gastroenteritis, Adult Introduction Viral gastroenteritis is also known as the stomach flu. This condition is caused by certain germs (viruses). These germs can be passed from person to person very easily (are very contagious). This condition can cause sudden watery poop (diarrhea), fever, and throwing up (vomiting). Having watery poop and throwing up can make you feel weak and cause you to get dehydrated. Dehydration can make you tired and thirsty, make you have a dry mouth, and make it so you pee (urinate) less often. Older adults and people with other diseases or a weak defense system (immune system) are at higher risk for dehydration. It is important to replace the fluids that you lose from having watery poop and throwing up. Follow these instructions at home: Follow instructions from your doctor about how to care for yourself at home. Eating and drinking Follow these instructions as told by your doctor:  Take an oral rehydration solution (ORS). This is a drink that is sold at pharmacies and stores.  Drink clear fluids in small amounts as you are able, such as:  Water.  Ice chips.  Diluted fruit juice.  Low-calorie sports drinks.  Eat bland, easy-to-digest foods in small amounts as you are able, such as:  Bananas.  Applesauce.  Rice.  Low-fat (lean) meats.  Toast.  Crackers.  Avoid fluids that have a lot of sugar or caffeine in them.  Avoid alcohol.  Avoid spicy or fatty foods. General instructions  Drink enough fluid to keep your pee (urine) clear or pale yellow.  Wash your hands often. If you cannot use soap and water, use hand sanitizer.  Make sure that all people in your home wash their hands well and often.  Rest at home while you get better.  Take over-the-counter and prescription medicines only as told by your doctor.  Watch your condition for any changes.  Take a warm bath to  help with any burning or pain from having watery poop.  Keep all follow-up visits as told by your doctor. This is important. Contact a doctor if:  You cannot keep fluids down.  Your symptoms get worse.  You have new symptoms.  You feel light-headed or dizzy.  You have muscle cramps. Get help right away if:  You have chest pain.  You feel very weak or you pass out (faint).  You see blood in your throw-up.  Your throw-up looks like coffee grounds.  You have bloody or black poop (stools) or poop that look like tar.  You have a very bad headache, a stiff neck, or both.  You have a rash.  You have very bad pain, cramping, or bloating in your belly (abdomen).  You have trouble breathing.  You are breathing very quickly.  Your heart is beating very quickly.  Your skin feels cold and clammy.  You feel confused.  You have pain when you pee.  You have signs of dehydration, such as:  Dark pee, hardly any pee, or no pee.  Cracked lips.  Dry mouth.  Sunken eyes.  Sleepiness.  Weakness. This information is not intended to replace advice given to you by your health care provider. Make sure you discuss any questions you have with your health care provider. Document Released: 05/20/2008 Document Revised: 06/21/2016 Document Reviewed: 08/08/2015  2017 Elsevier

## 2017-02-03 NOTE — Progress Notes (Signed)
Subjective:    Patient ID: Katrina Kelly, female    DOB: Sep 23, 1961, 56 y.o.   MRN: [id number]  HPI  Katrina Kelly is a 56 yr old female who presents today with report of intermittent nausea/vomitting/diarrhea Last Friday x 24 hours. Then occurred this past Friday night until 3 AM yesterday.  Today she reports that she is tolerating dry toast/coke.  She denies abdominal pain, fever or chills. Reports that she had a bad HA yesterday.  Reports that her husband had some mild GI issues.  Denies nasal congestion/sore throat.  Review of Systems See HPI  Past Medical History:  Diagnosis Date  . Allergy   . Anxiety   . Basal cell carcinoma    skin   . Cardiomyopathy ([place])    EF 35% per pt  . CHF (congestive heart failure) ([place])   . Diabetes type 2, uncontrolled ([place])   . Heartburn   . Hypertension      Social History   Social History  . Marital status: Married    Spouse name: N/A  . Number of children: N/A  . Years of education: N/A   Occupational History  . Not on file.   Social History Main Topics  . Smoking status: Former Research scientist (life sciences)  . Smokeless tobacco: Never Used  . Alcohol use 0.6 oz/week    1 Standard drinks or equivalent per week     Comment: twice monthly  . Drug use: No  . Sexual activity: Not on file   Other Topics Concern  . 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   Daughter/fiance and grandson live downstairs   Enjoys beach, decorating/yard work.   One dog       Past Surgical History:  Procedure Laterality Date  . CHOLECYSTECTOMY  2008  . COLONOSCOPY WITH PROPOFOL N/A 01/03/2017   Procedure: COLONOSCOPY WITH PROPOFOL;  Surgeon: Mauri Pole, MD;  Location: [facility];  Service: Endoscopy;  Laterality: N/A;  . FOOT SURGERY  1982   "benign tumor bottom of right foot"  . NASAL SEPTUM SURGERY  2000  . TONSILLECTOMY  2000  . uterine ablation    . UVULECTOMY  2000     Family History  Problem Relation Age of Onset  . Diabetes Mother   . Hypertension Mother   . Cancer Father     lung  . Emphysema Father   . Diabetes Sister   . Diabetes Brother   . Leukemia Brother   . Stroke Brother   . Heart attack Maternal Uncle   . Breast cancer Paternal Aunt   . Lymphoma Paternal Uncle   . Polycystic ovary syndrome Daughter   . Hypertension Daughter   . Diabetes Mellitus II Maternal Grandmother   . Colon cancer Neg Hx     Allergies  Allergen Reactions  . Lactose Intolerance (Gi)     GI symptoms    Current Outpatient Prescriptions on File Prior to Visit  Medication Sig Dispense Refill  . aspirin EC 81 MG tablet Take 1 tablet (81 mg total) by mouth daily.    Marland Kitchen atorvastatin (LIPITOR) 10 MG tablet TAKE 1 TABLET(10 MG) BY MOUTH DAILY 30 tablet 1  . carvedilol (COREG) 6.25 MG tablet Take 6.25 mg by mouth 2 (two) times daily.    . cetirizine (ZYRTEC) 10 MG tablet Take 10 mg by mouth daily.    . fluticasone (FLONASE) 50 MCG/ACT  nasal spray Place 2 sprays into both nostrils daily. (Patient taking differently: Place 2 sprays into both nostrils daily as needed for allergies. ) 16 g 1  . furosemide (LASIX) 40 MG tablet Take 40 mg by mouth daily as needed for edema.     . isosorbide mononitrate (IMDUR) 30 MG 24 hr tablet Take 30 mg by mouth at bedtime.     Marland Kitchen lisinopril (PRINIVIL,ZESTRIL) 5 MG tablet Take 5 mg by mouth daily.    . metFORMIN (GLUCOPHAGE) 500 MG tablet Take 1 tablet (500 mg total) by mouth 2 (two) times daily with a meal. 60 tablet 3  . nitroGLYCERIN (NITROSTAT) 0.4 MG SL tablet Place 0.4 mg under the tongue every 5 (five) minutes as needed for chest pain.     Marland Kitchen oxymetazoline (AFRIN NASAL SPRAY) 0.05 % nasal spray Place 2 sprays into both nostrils 2 (two) times daily as needed for congestion.  30 mL 0  . pantoprazole (PROTONIX) 40 MG tablet TAKE 1 TABLET(40 MG) BY MOUTH DAILY (Patient taking differently: TAKE 1 TABLET(40 MG) BY MOUTH DAILY AT NIGHT)  30 tablet 1  . potassium chloride (K-DUR,KLOR-CON) 10 MEQ tablet Take 10 mEq by mouth daily as needed (swelling).      No current facility-administered medications on file prior to visit.     BP 112/74 (BP Location: Right Arm, Patient Position: Sitting, Cuff Size: Large)   Pulse (!) 111   Temp 97.6 F (36.4 C) (Oral)   Ht 5\' 7"  (1.702 m)   Wt 203 lb 12.8 oz (92.4 kg)   SpO2 96%   BMI 31.92 kg/m       Objective:   Physical Exam  Constitutional: She is oriented to person, place, and time. She appears well-developed and well-nourished.  HENT:  Head: Normocephalic and atraumatic.  Cardiovascular: Normal rate, regular rhythm and normal heart sounds.   No murmur heard. Pulmonary/Chest: Effort normal and breath sounds normal. No respiratory distress. She has no wheezes.  Musculoskeletal: She exhibits no edema.  Neurological: She is alert and oriented to person, place, and time.  Psychiatric: She has a normal mood and affect. Her behavior is normal. Judgment and thought content normal.          Assessment & Plan:  Viral Gastroenteritis- symptoms most consistent with resolving viral gastroenteritis. Her HR is mildly elevated. Suspect mild dehydration. Advised pt to push fluids, call if recurrent symptoms. Declines anti-emetic as nausea is resolved. Advised ok to use imodium prn diarrhea.

## 2017-02-03 NOTE — Progress Notes (Signed)
Pre visit review using our clinic review tool, if applicable. No additional management support is needed unless otherwise documented below in the visit note. 

## 2017-02-24 ENCOUNTER — Ambulatory Visit (INDEPENDENT_AMBULATORY_CARE_PROVIDER_SITE_OTHER): Payer: 59 | Admitting: Family

## 2017-02-24 ENCOUNTER — Encounter: Payer: Self-pay | Admitting: Family

## 2017-02-24 VITALS — BP 108/70 | HR 86 | Temp 97.9°F | Resp 16 | Ht 67.0 in | Wt 209.6 lb

## 2017-02-24 DIAGNOSIS — E1165 Type 2 diabetes mellitus with hyperglycemia: Secondary | ICD-10-CM | POA: Diagnosis not present

## 2017-02-24 DIAGNOSIS — Z23 Encounter for immunization: Secondary | ICD-10-CM | POA: Diagnosis not present

## 2017-02-24 DIAGNOSIS — J329 Chronic sinusitis, unspecified: Secondary | ICD-10-CM

## 2017-02-24 DIAGNOSIS — IMO0001 Reserved for inherently not codable concepts without codable children: Secondary | ICD-10-CM

## 2017-02-24 LAB — BASIC METABOLIC PANEL
BUN: 13 mg/dL (ref 6–23)
CO2: 25 mEq/L (ref 19–32)
Calcium: 9.8 mg/dL (ref 8.4–10.5)
Chloride: 105 mEq/L (ref 96–112)
Creatinine, Ser: 0.83 mg/dL (ref 0.40–1.20)
GFR: 75.72 mL/min (ref >60.00)
Glucose, Bld: 174 mg/dL — ABNORMAL HIGH (ref 70–99)
Potassium: 3.9 mEq/L (ref 3.5–5.1)
Sodium: 139 mEq/L (ref 135–145)

## 2017-02-24 LAB — HEMOGLOBIN A1C: Hgb A1c MFr Bld: 7.4 % — ABNORMAL HIGH (ref 4.6–6.5)

## 2017-02-24 MED ORDER — AMOXICILLIN-POT CLAVULANATE 875-125 MG PO TABS: 1.0000 | ORAL_TABLET | Freq: Two times a day (BID) | ORAL | 0 refills | Status: DC

## 2017-02-24 NOTE — Assessment & Plan Note (Signed)
Advised pt to check sugars. Hand written rx provided for glucose meter and test strips.

## 2017-02-24 NOTE — Addendum Note (Signed)
Addended by: Kelle Darting A on: 02/24/2017 12:09 PM   Modules accepted: Orders

## 2017-02-24 NOTE — Patient Instructions (Addendum)
Please complete lab work prior to leaving.  Begin Augmentin for sinus infection. You will be contacted about your referral to ENT to address your recurrent sinus infections. Please call if new/worsening symptoms or if symptoms are not improved in 1 week.

## 2017-02-24 NOTE — Progress Notes (Signed)
Subjective:    Patient ID: Katrina Kelly, female    DOB: December 11, 1961, 56 y.o.   MRN: [id number]  HPI  Katrina Kelly is a 56 yr old female who presents today for follow up.  1) DM2- maintained on metformin. Denies polyuria. Diet is improving. Trying to cut back on her sweets and carbs.  Lab Results  Component Value Date   HGBA1C 8.1 (H) 11/25/2016   HGBA1C 7.3 (H) 08/15/2016   Lab Results  Component Value Date   LDLCALC 61 08/13/2016   CREATININE 0.85 11/25/2016   2) Cough- reports 1 week history of productive cough.  + left sidded facial pain, + tooth pain. She has hx of sinus surgery and has seen ENT (has seen [facility] in [place]).  This was 4-5 years ago.     Review of Systems See HPI  Past Medical History:  Diagnosis Date  . Allergy   . Anxiety   . Basal cell carcinoma    skin   . Cardiomyopathy ([place])    EF 35% per pt  . CHF (congestive heart failure) ([place])   . Diabetes type 2, uncontrolled ([place])   . Heartburn   . Hypertension      Social History   Social History  . Marital status: Married    Spouse name: N/A  . Number of children: N/A  . Years of education: N/A   Occupational History  . Not on file.   Social History Main Topics  . Smoking status: Former Research scientist (life sciences)  . Smokeless tobacco: Never Used  . Alcohol use 0.6 oz/week    1 Standard drinks or equivalent per week     Comment: twice monthly  . Drug use: No  . Sexual activity: Not on file   Other Topics Concern  . 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   Daughter/fiance and grandson live downstairs   Enjoys beach, decorating/yard work.   One dog       Past Surgical History:  Procedure Laterality Date  . CHOLECYSTECTOMY  2008  . COLONOSCOPY WITH PROPOFOL N/A 01/03/2017   Procedure: COLONOSCOPY WITH PROPOFOL;  Surgeon: Mauri Pole, MD;  Location: [facility];  Service: Endoscopy;  Laterality: N/A;  .  FOOT SURGERY  1982   "benign tumor bottom of right foot"  . NASAL SEPTUM SURGERY  2000  . TONSILLECTOMY  2000  . uterine ablation    . UVULECTOMY  2000    Family History  Problem Relation Age of Onset  . Diabetes Mother   . Hypertension Mother   . Cancer Father     lung  . Emphysema Father   . Diabetes Sister   . Diabetes Brother   . Leukemia Brother   . Stroke Brother   . Heart attack Maternal Uncle   . Breast cancer Paternal Aunt   . Lymphoma Paternal Uncle   . Polycystic ovary syndrome Daughter   . Hypertension Daughter   . Diabetes Mellitus II Maternal Grandmother   . Colon cancer Neg Hx     Allergies  Allergen Reactions  . Lactose Intolerance (Gi)     GI symptoms    Current Outpatient Prescriptions on File Prior to Visit  Medication Sig Dispense Refill  . aspirin EC 81 MG tablet Take 1 tablet (81 mg total) by mouth daily.    Marland Kitchen atorvastatin (LIPITOR) 10 MG tablet TAKE 1 TABLET(10 MG) BY  MOUTH DAILY 30 tablet 1  . carvedilol (COREG) 6.25 MG tablet Take 6.25 mg by mouth 2 (two) times daily.    . cetirizine (ZYRTEC) 10 MG tablet Take 10 mg by mouth daily.    . fluticasone (FLONASE) 50 MCG/ACT nasal spray Place 2 sprays into both nostrils daily. (Patient taking differently: Place 2 sprays into both nostrils daily as needed for allergies. ) 16 g 1  . furosemide (LASIX) 40 MG tablet Take 40 mg by mouth daily as needed for edema.     . isosorbide mononitrate (IMDUR) 30 MG 24 hr tablet Take 30 mg by mouth at bedtime.     Marland Kitchen lisinopril (PRINIVIL,ZESTRIL) 5 MG tablet Take 5 mg by mouth daily.    . metFORMIN (GLUCOPHAGE) 500 MG tablet Take 1 tablet (500 mg total) by mouth 2 (two) times daily with a meal. 60 tablet 3  . nitroGLYCERIN (NITROSTAT) 0.4 MG SL tablet Place 0.4 mg under the tongue every 5 (five) minutes as needed for chest pain.     Marland Kitchen oxymetazoline (AFRIN NASAL SPRAY) 0.05 % nasal spray Place 2 sprays into both nostrils 2 (two) times daily as needed for congestion.  30  mL 0  . pantoprazole (PROTONIX) 40 MG tablet TAKE 1 TABLET(40 MG) BY MOUTH DAILY (Patient taking differently: TAKE 1 TABLET(40 MG) BY MOUTH DAILY AT NIGHT) 30 tablet 1  . potassium chloride (K-DUR,KLOR-CON) 10 MEQ tablet Take 10 mEq by mouth daily as needed (swelling).      No current facility-administered medications on file prior to visit.     BP 108/70 (BP Location: Left Arm, Cuff Size: Large)   Pulse 86   Temp 97.9 F (36.6 C) (Oral)   Resp 16   Ht 5\' 7"  (1.702 m)   Wt 209 lb 9.6 oz (95.1 kg)   SpO2 98% Comment: room air  BMI 32.83 kg/m       Objective:   Physical Exam  Constitutional: She is oriented to person, place, and time. She appears well-developed and well-nourished.  HENT:  Head: Normocephalic and atraumatic.  Right Ear: Tympanic membrane and ear canal normal.  Left Ear: Tympanic membrane and ear canal normal.  Nose: Right sinus exhibits maxillary sinus tenderness. Left sinus exhibits maxillary sinus tenderness.  Mouth/Throat: No oropharyngeal exudate, posterior oropharyngeal edema or posterior oropharyngeal erythema.  Cardiovascular: Normal rate, regular rhythm and normal heart sounds.   No murmur heard. Pulmonary/Chest: Effort normal and breath sounds normal. No respiratory distress. She has no wheezes.  Musculoskeletal: She exhibits no edema.  Neurological: She is alert and oriented to person, place, and time.  Psychiatric: She has a normal mood and affect. Her behavior is normal. Judgment and thought content normal.          Assessment & Plan:  Recurrent sinusitis- rx with augmentin.  Refer to ENT for ongoing management since she has hx of sinus surgery and recurrent sinus infections.   Pneumovax today.

## 2017-02-24 NOTE — Progress Notes (Signed)
Pre visit review using our clinic review tool, if applicable. No additional management support is needed unless otherwise documented below in the visit note. 

## 2017-02-25 ENCOUNTER — Other Ambulatory Visit: Payer: Self-pay | Admitting: Family

## 2017-02-25 ENCOUNTER — Telehealth: Payer: Self-pay | Admitting: Family

## 2017-02-25 MED ORDER — SITAGLIPTIN PHOSPHATE 100 MG PO TABS: 100.0000 mg | ORAL_TABLET | Freq: Every day | ORAL | 3 refills | Status: DC

## 2017-02-25 NOTE — Telephone Encounter (Signed)
Sugar is improving but still above goal.  Please add januvia once daily.

## 2017-02-25 NOTE — Telephone Encounter (Signed)
Spoke pt about results and notified pt that Rx was sent to pharmacy.

## 2017-03-05 DIAGNOSIS — C44321 Squamous cell carcinoma of skin of nose: Secondary | ICD-10-CM | POA: Diagnosis not present

## 2017-03-05 DIAGNOSIS — D485 Neoplasm of uncertain behavior of skin: Secondary | ICD-10-CM | POA: Diagnosis not present

## 2017-03-05 DIAGNOSIS — L570 Actinic keratosis: Secondary | ICD-10-CM | POA: Diagnosis not present

## 2017-03-24 DIAGNOSIS — J329 Chronic sinusitis, unspecified: Secondary | ICD-10-CM | POA: Diagnosis not present

## 2017-03-24 DIAGNOSIS — J310 Chronic rhinitis: Secondary | ICD-10-CM | POA: Diagnosis not present

## 2017-03-24 DIAGNOSIS — J343 Hypertrophy of nasal turbinates: Secondary | ICD-10-CM | POA: Diagnosis not present

## 2017-03-25 ENCOUNTER — Other Ambulatory Visit: Payer: Self-pay | Admitting: Family

## 2017-04-03 ENCOUNTER — Other Ambulatory Visit: Payer: Self-pay | Admitting: Obstetrics and Gynecology

## 2017-04-03 DIAGNOSIS — Z1231 Encounter for screening mammogram for malignant neoplasm of breast: Secondary | ICD-10-CM

## 2017-04-07 ENCOUNTER — Ambulatory Visit
Admission: RE | Admit: 2017-04-07 | Discharge: 2017-04-07 | Disposition: A | Discharge status: Home / Self Care | Payer: 59 | Source: Ambulatory Visit | Attending: Obstetrics and Gynecology | Admitting: Obstetrics and Gynecology

## 2017-04-07 DIAGNOSIS — Z1231 Encounter for screening mammogram for malignant neoplasm of breast: Secondary | ICD-10-CM | POA: Diagnosis not present

## 2017-05-06 DIAGNOSIS — J310 Chronic rhinitis: Secondary | ICD-10-CM | POA: Diagnosis not present

## 2017-05-06 DIAGNOSIS — J329 Chronic sinusitis, unspecified: Secondary | ICD-10-CM | POA: Diagnosis not present

## 2017-05-09 ENCOUNTER — Encounter: Payer: Self-pay | Admitting: Family

## 2017-05-09 ENCOUNTER — Telehealth: Payer: Self-pay | Admitting: Family

## 2017-05-09 MED ORDER — ONETOUCH ULTRASOFT LANCETS MISC: 2 refills | Status: DC

## 2017-05-09 MED ORDER — ONETOUCH DELICA LANCETS 33G MISC: 2 refills | Status: DC

## 2017-05-09 NOTE — Telephone Encounter (Signed)
Rx sent. Notified pt. She requests onetouch delica. Rx sent with note to cancel onetouch ultrasoft lancet.

## 2017-05-09 NOTE — Telephone Encounter (Signed)
Relation to UM:PNTI Call back number:[phone] Pharmacy: [facility], [place] - [address] AT [place] [phone] (Phone) [phone] (Fax)     Reason for call:  Patient requesting needles/ lancets for one touch glucose meter, please advise

## 2017-05-13 DIAGNOSIS — Z85828 Personal history of other malignant neoplasm of skin: Secondary | ICD-10-CM | POA: Diagnosis not present

## 2017-05-13 DIAGNOSIS — L820 Inflamed seborrheic keratosis: Secondary | ICD-10-CM | POA: Diagnosis not present

## 2017-05-13 DIAGNOSIS — Z08 Encounter for follow-up examination after completed treatment for malignant neoplasm: Secondary | ICD-10-CM | POA: Diagnosis not present

## 2017-05-13 DIAGNOSIS — D485 Neoplasm of uncertain behavior of skin: Secondary | ICD-10-CM | POA: Diagnosis not present

## 2017-05-13 DIAGNOSIS — C44619 Basal cell carcinoma of skin of left upper limb, including shoulder: Secondary | ICD-10-CM | POA: Diagnosis not present

## 2017-05-16 NOTE — Addendum Note (Signed)
Addendum  created 05/16/17 0946 by Rica Koyanagi, MD   Sign clinical note

## 2017-05-26 ENCOUNTER — Ambulatory Visit (INDEPENDENT_AMBULATORY_CARE_PROVIDER_SITE_OTHER): Payer: 59 | Admitting: Family

## 2017-05-26 ENCOUNTER — Encounter: Payer: Self-pay | Admitting: Family

## 2017-05-26 VITALS — BP 109/70 | HR 83 | Temp 98.0°F | Resp 18 | Ht 67.0 in | Wt 200.6 lb

## 2017-05-26 DIAGNOSIS — E785 Hyperlipidemia, unspecified: Secondary | ICD-10-CM | POA: Insufficient documentation

## 2017-05-26 DIAGNOSIS — I10 Essential (primary) hypertension: Secondary | ICD-10-CM

## 2017-05-26 DIAGNOSIS — IMO0001 Reserved for inherently not codable concepts without codable children: Secondary | ICD-10-CM

## 2017-05-26 DIAGNOSIS — E1165 Type 2 diabetes mellitus with hyperglycemia: Secondary | ICD-10-CM | POA: Diagnosis not present

## 2017-05-26 DIAGNOSIS — I428 Other cardiomyopathies: Secondary | ICD-10-CM | POA: Diagnosis not present

## 2017-05-26 LAB — BASIC METABOLIC PANEL
BUN: 12 mg/dL (ref 6–23)
CO2: 22 mEq/L (ref 19–32)
Calcium: 9.6 mg/dL (ref 8.4–10.5)
Chloride: 107 mEq/L (ref 96–112)
Creatinine, Ser: 0.89 mg/dL (ref 0.40–1.20)
GFR: 69.8 mL/min (ref >60.00)
Glucose, Bld: 126 mg/dL — ABNORMAL HIGH (ref 70–99)
Potassium: 3.8 mEq/L (ref 3.5–5.1)
Sodium: 140 mEq/L (ref 135–145)

## 2017-05-26 LAB — LIPID PANEL
Cholesterol: 130 mg/dL (ref 0–200)
HDL: 32.1 mg/dL — ABNORMAL LOW (ref >39.00)
LDL Cholesterol: 69 mg/dL (ref 0–99)
NonHDL: 97.77
Total CHOL/HDL Ratio: 4
Triglycerides: 146 mg/dL (ref 0.0–149.0)
VLDL: 29.2 mg/dL (ref 0.0–40.0)

## 2017-05-26 LAB — HEMOGLOBIN A1C: Hgb A1c MFr Bld: 6.8 % — ABNORMAL HIGH (ref 4.6–6.5)

## 2017-05-26 NOTE — Assessment & Plan Note (Signed)
Obtain follow up lipid panel.

## 2017-05-26 NOTE — Assessment & Plan Note (Signed)
Clinically stable. Management per cardiology- Dr. Pasi.

## 2017-05-26 NOTE — Progress Notes (Signed)
Subjective:    Patient ID: Katrina Kelly, female    DOB: 04-29-1961, 56 y.o.   MRN: [id number]  HPI  Katrina Kelly is a 56 yr old female who presents today for follow up.  1) NICM- maintained on lisinopril, coreg. Denies swelling or CP. Reports hx of DOE due to her CHF which is at baseline and she is followed by cardiology. Reports that she has not needed to take lasix recently.  BP Readings from Last 3 Encounters:  05/26/17 109/70  02/24/17 108/70  02/03/17 112/74   2) Hyperlipidemia- maintained on lipitor 10mg . Denies myalgia. Does have some mild joint stiffness when she first starts walking.   Lab Results  Component Value Date   CHOL 121 08/13/2016   HDL 33.60 (L) 08/13/2016   LDLCALC 61 08/13/2016   TRIG 130.0 08/13/2016   CHOLHDL 4 08/13/2016   3) DM2- maintained on metformin and januvia.  Sugars 117-143 at home.  Does have some occasional diarrhea, attributes to metformin.  Does not wish to d/c metformin at this time since sxs are tolerable.  Lab Results  Component Value Date   HGBA1C 7.4 (H) 02/24/2017   HGBA1C 8.1 (H) 11/25/2016   HGBA1C 7.3 (H) 08/15/2016   Lab Results  Component Value Date   LDLCALC 61 08/13/2016   CREATININE 0.83 02/24/2017      Review of Systems    see HPI  Past Medical History:  Diagnosis Date  . Allergy   . Anxiety   . Basal cell carcinoma    skin   . Cardiomyopathy ([place])    EF 35% per pt  . CHF (congestive heart failure) ([place])   . Diabetes type 2, uncontrolled ([place])   . Heartburn   . Hypertension      Social History   Social History  . Marital status: Married    Spouse name: N/A  . Number of children: N/A  . Years of education: N/A   Occupational History  . Not on file.   Social History Main Topics  . Smoking status: Former Research scientist (life sciences)  . Smokeless tobacco: Never Used  . Alcohol use 0.6 oz/week    1 Standard drinks or equivalent per week     Comment: twice monthly  . Drug use: No  . Sexual activity: Not on file    Other Topics Concern  . 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   Daughter/fiance and grandson live downstairs   Enjoys beach, decorating/yard work.   One dog       Past Surgical History:  Procedure Laterality Date  . CHOLECYSTECTOMY  2008  . COLONOSCOPY WITH PROPOFOL N/A 01/03/2017   Procedure: COLONOSCOPY WITH PROPOFOL;  Surgeon: Mauri Pole, MD;  Location: [facility];  Service: Endoscopy;  Laterality: N/A;  . FOOT SURGERY  1982   "benign tumor bottom of right foot"  . NASAL SEPTUM SURGERY  2000  . TONSILLECTOMY  2000  . uterine ablation    . UVULECTOMY  2000    Family History  Problem Relation Age of Onset  . Diabetes Mother   . Hypertension Mother   . Cancer Father        lung  . Emphysema Father   . Diabetes Sister   . Diabetes Brother   . Leukemia Brother   . Stroke Brother   . Heart attack Maternal Uncle   . Breast cancer Paternal Aunt  70  . Lymphoma Paternal Uncle   . Polycystic ovary syndrome Daughter   . Hypertension Daughter   . Diabetes Mellitus II Maternal Grandmother   . Colon cancer Neg Hx     Allergies  Allergen Reactions  . Lactose Intolerance (Gi)     GI symptoms    Current Outpatient Prescriptions on File Prior to Visit  Medication Sig Dispense Refill  . aspirin EC 81 MG tablet Take 1 tablet (81 mg total) by mouth daily.    Marland Kitchen atorvastatin (LIPITOR) 10 MG tablet TAKE 1 TABLET(10 MG) BY MOUTH DAILY 30 tablet 5  . carvedilol (COREG) 6.25 MG tablet Take 6.25 mg by mouth 2 (two) times daily.    . cetirizine (ZYRTEC) 10 MG tablet Take 10 mg by mouth daily.    . fluticasone (FLONASE) 50 MCG/ACT nasal spray Place 2 sprays into both nostrils daily. (Patient taking differently: Place 2 sprays into both nostrils daily as needed for allergies. ) 16 g 1  . furosemide (LASIX) 40 MG tablet Take 40 mg by mouth daily as needed for edema.     . isosorbide  mononitrate (IMDUR) 30 MG 24 hr tablet Take 30 mg by mouth at bedtime.     Marland Kitchen lisinopril (PRINIVIL,ZESTRIL) 5 MG tablet Take 5 mg by mouth daily.    . metFORMIN (GLUCOPHAGE) 500 MG tablet TAKE 1 TABLET(500 MG) BY MOUTH TWICE DAILY WITH A MEAL 60 tablet 5  . nitroGLYCERIN (NITROSTAT) 0.4 MG SL tablet Place 0.4 mg under the tongue every 5 (five) minutes as needed for chest pain.     Glory Rosebush DELICA LANCETS 77L MISC USE TO CHECK BLOOD SUGAR 1-2 TIMES DAILY. DX E11.65 100 each 2  . oxymetazoline (AFRIN NASAL SPRAY) 0.05 % nasal spray Place 2 sprays into both nostrils 2 (two) times daily as needed for congestion.  30 mL 0  . pantoprazole (PROTONIX) 40 MG tablet TAKE 1 TABLET(40 MG) BY MOUTH DAILY 30 tablet 5  . potassium chloride (K-DUR,KLOR-CON) 10 MEQ tablet Take 10 mEq by mouth daily as needed (swelling).     . sitaGLIPtin (JANUVIA) 100 MG tablet Take 1 tablet (100 mg total) by mouth daily. 30 tablet 3   No current facility-administered medications on file prior to visit.     BP 109/70 (BP Location: Right Arm, Cuff Size: Large)   Pulse 83   Temp 98 F (36.7 C) (Oral)   Resp 18   Ht 5\' 7"  (1.702 m)   Wt 200 lb 9.6 oz (91 kg)   SpO2 99%   BMI 31.42 kg/m    Objective:   Physical Exam  Constitutional: She is oriented to person, place, and time. She appears well-developed and well-nourished.  Cardiovascular: Normal rate, regular rhythm and normal heart sounds.   No murmur heard. Pulmonary/Chest: Effort normal and breath sounds normal. No respiratory distress. She has no wheezes.  Musculoskeletal: She exhibits no edema.  Neurological: She is alert and oriented to person, place, and time.  Psychiatric: She has a normal mood and affect. Her behavior is normal. Judgment and thought content normal.          Assessment & Plan:

## 2017-05-26 NOTE — Assessment & Plan Note (Signed)
Stable. Obtain A1c. We discussed changing meds to janumet xr to see if this helps with her diarrhea- she wishes to hold off of now.

## 2017-05-26 NOTE — Patient Instructions (Signed)
Please complete lab work prior to leaving.   

## 2017-05-27 ENCOUNTER — Encounter: Payer: Self-pay | Admitting: Family

## 2017-05-28 DIAGNOSIS — Z01419 Encounter for gynecological examination (general) (routine) without abnormal findings: Secondary | ICD-10-CM | POA: Diagnosis not present

## 2017-05-28 DIAGNOSIS — R87610 Atypical squamous cells of undetermined significance on cytologic smear of cervix (ASC-US): Secondary | ICD-10-CM | POA: Diagnosis not present

## 2017-06-11 DIAGNOSIS — D485 Neoplasm of uncertain behavior of skin: Secondary | ICD-10-CM | POA: Diagnosis not present

## 2017-06-11 DIAGNOSIS — C44319 Basal cell carcinoma of skin of other parts of face: Secondary | ICD-10-CM | POA: Diagnosis not present

## 2017-06-11 DIAGNOSIS — C44619 Basal cell carcinoma of skin of left upper limb, including shoulder: Secondary | ICD-10-CM | POA: Diagnosis not present

## 2017-06-23 DIAGNOSIS — R87610 Atypical squamous cells of undetermined significance on cytologic smear of cervix (ASC-US): Secondary | ICD-10-CM | POA: Diagnosis not present

## 2017-06-23 DIAGNOSIS — N870 Mild cervical dysplasia: Secondary | ICD-10-CM | POA: Diagnosis not present

## 2017-06-27 DIAGNOSIS — I428 Other cardiomyopathies: Secondary | ICD-10-CM | POA: Diagnosis not present

## 2017-07-02 ENCOUNTER — Other Ambulatory Visit: Payer: Self-pay | Admitting: Family

## 2017-07-06 ENCOUNTER — Other Ambulatory Visit: Payer: Self-pay | Admitting: Family

## 2017-08-11 ENCOUNTER — Other Ambulatory Visit: Payer: Self-pay | Admitting: Family

## 2017-08-25 ENCOUNTER — Ambulatory Visit (INDEPENDENT_AMBULATORY_CARE_PROVIDER_SITE_OTHER): Payer: 59 | Admitting: Family

## 2017-08-25 ENCOUNTER — Encounter: Payer: Self-pay | Admitting: Family

## 2017-08-25 VITALS — BP 101/60 | HR 78 | Temp 98.2°F | Resp 18 | Ht 67.0 in | Wt 200.0 lb

## 2017-08-25 DIAGNOSIS — E1149 Type 2 diabetes mellitus with other diabetic neurological complication: Secondary | ICD-10-CM

## 2017-08-25 DIAGNOSIS — E785 Hyperlipidemia, unspecified: Secondary | ICD-10-CM | POA: Diagnosis not present

## 2017-08-25 DIAGNOSIS — E118 Type 2 diabetes mellitus with unspecified complications: Secondary | ICD-10-CM | POA: Diagnosis not present

## 2017-08-25 DIAGNOSIS — G629 Polyneuropathy, unspecified: Secondary | ICD-10-CM | POA: Insufficient documentation

## 2017-08-25 DIAGNOSIS — G6289 Other specified polyneuropathies: Secondary | ICD-10-CM

## 2017-08-25 DIAGNOSIS — F439 Reaction to severe stress, unspecified: Secondary | ICD-10-CM | POA: Diagnosis not present

## 2017-08-25 DIAGNOSIS — Z23 Encounter for immunization: Secondary | ICD-10-CM

## 2017-08-25 DIAGNOSIS — I428 Other cardiomyopathies: Secondary | ICD-10-CM

## 2017-08-25 LAB — BASIC METABOLIC PANEL
BUN: 9 mg/dL (ref 6–23)
CO2: 22 mEq/L (ref 19–32)
Calcium: 9.6 mg/dL (ref 8.4–10.5)
Chloride: 105 mEq/L (ref 96–112)
Creatinine, Ser: 0.9 mg/dL (ref 0.40–1.20)
GFR: 68.84 mL/min (ref >60.00)
Glucose, Bld: 124 mg/dL — ABNORMAL HIGH (ref 70–99)
Potassium: 3.7 mEq/L (ref 3.5–5.1)
Sodium: 140 mEq/L (ref 135–145)

## 2017-08-25 LAB — MICROALBUMIN / CREATININE URINE RATIO
Creatinine,U: 150.7 mg/dL
Microalb Creat Ratio: 0.5 mg/g (ref 0.0–30.0)
Microalb, Ur: 0.7 mg/dL (ref 0.0–1.9)

## 2017-08-25 LAB — HEMOGLOBIN A1C: Hgb A1c MFr Bld: 6.2 % (ref 4.6–6.5)

## 2017-08-25 NOTE — Assessment & Plan Note (Signed)
LDL at goal, continue stating.

## 2017-08-25 NOTE — Progress Notes (Signed)
Subjective:    Patient ID: Katrina Kelly, female    DOB: September 21, 1961, 56 y.o.   MRN: [id number]  HPI  Katrina Kelly is a 56 yr old female who presents today for follow up of her diabetes.  DM2- maintained on metformin and januvia.  reports fasting sugars around 115.  She does report some intermittent "Pin and needles" in her feet.  This is worse after prolonged sitting.   Lab Results  Component Value Date   HGBA1C 6.8 (H) 05/26/2017   HGBA1C 7.4 (H) 02/24/2017   HGBA1C 8.1 (H) 11/25/2016   Lab Results  Component Value Date   LDLCALC 69 05/26/2017   CREATININE 0.89 05/26/2017   Hyperlipidemia- maintained on atorvastatin 10 mg.  Lab Results  Component Value Date   CHOL 130 05/26/2017   HDL 32.10 (L) 05/26/2017   LDLCALC 69 05/26/2017   TRIG 146.0 05/26/2017   CHOLHDL 4 05/26/2017   NICM- maintained on coreg, lisinopril.  Denies dizziness, CP/SOB.  Followed with cardiology.  BP Readings from Last 3 Encounters:  08/25/17 101/60  05/26/17 109/70  02/24/17 108/70    Reports trouble staying asleep.  Mind starts racing when she wakes up.  Does not feel like she has things she looks forward to doing. Reports she just wants to keep to herself.  Was on wellbutrin in the past.  Has family stressors, work stressors.  Denies SI/HI.    Review of Systems See HPI  Past Medical History:  Diagnosis Date  . Allergy   . Anxiety   . Basal cell carcinoma    skin   . Cardiomyopathy ([place])    EF 35% per pt  . CHF (congestive heart failure) ([place])   . Diabetes type 2, uncontrolled ([place])   . Heartburn   . Hypertension      Social History   Social History  . Marital status: Married    Spouse name: N/A  . Number of children: N/A  . Years of education: N/A   Occupational History  . Not on file.   Social History Main Topics  . Smoking status: Former Research scientist (life sciences)  . Smokeless tobacco: Never Used  . Alcohol use 0.6 oz/week    1 Standard drinks or equivalent per week     Comment: twice  monthly  . Drug use: No  . Sexual activity: Not on file   Other Topics Concern  . 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   Daughter/fiance and grandson live downstairs   Enjoys beach, decorating/yard work.   One dog       Past Surgical History:  Procedure Laterality Date  . CHOLECYSTECTOMY  2008  . COLONOSCOPY WITH PROPOFOL N/A 01/03/2017   Procedure: COLONOSCOPY WITH PROPOFOL;  Surgeon: Mauri Pole, MD;  Location: [facility];  Service: Endoscopy;  Laterality: N/A;  . FOOT SURGERY  1982   "benign tumor bottom of right foot"  . NASAL SEPTUM SURGERY  2000  . TONSILLECTOMY  2000  . uterine ablation    . UVULECTOMY  2000    Family History  Problem Relation Age of Onset  . Diabetes Mother   . Hypertension Mother   . Cancer Father        lung  . Emphysema Father   . Diabetes Sister   . Diabetes Brother   . Leukemia Brother   . Stroke Brother   . Heart attack Maternal  Uncle   . Breast cancer Paternal Aunt 23  . Lymphoma Paternal Uncle   . Polycystic ovary syndrome Daughter   . Hypertension Daughter   . Diabetes Mellitus II Maternal Grandmother   . Colon cancer Neg Hx     Allergies  Allergen Reactions  . Lactose Intolerance (Gi)     GI symptoms    Current Outpatient Prescriptions on File Prior to Visit  Medication Sig Dispense Refill  . aspirin EC 81 MG tablet Take 1 tablet (81 mg total) by mouth daily.    Marland Kitchen atorvastatin (LIPITOR) 10 MG tablet TAKE 1 TABLET(10 MG) BY MOUTH DAILY 30 tablet 5  . Blood Glucose Monitoring Suppl (ONETOUCH VERIO) w/Device KIT USE AS DIRECTED 1 kit 0  . carvedilol (COREG) 6.25 MG tablet Take 6.25 mg by mouth 2 (two) times daily.    . cetirizine (ZYRTEC) 10 MG tablet Take 10 mg by mouth daily.    . fluticasone (FLONASE) 50 MCG/ACT nasal spray Place 2 sprays into both nostrils daily. (Patient taking differently: Place 2 sprays into both nostrils  daily as needed for allergies. ) 16 g 1  . furosemide (LASIX) 40 MG tablet Take 40 mg by mouth daily as needed for edema.     . isosorbide mononitrate (IMDUR) 30 MG 24 hr tablet Take 30 mg by mouth at bedtime.     Marland Kitchen JANUVIA 100 MG tablet TAKE 1 TABLET(100 MG) BY MOUTH DAILY 30 tablet 0  . lisinopril (PRINIVIL,ZESTRIL) 5 MG tablet Take 5 mg by mouth daily.    . metFORMIN (GLUCOPHAGE) 500 MG tablet TAKE 1 TABLET(500 MG) BY MOUTH TWICE DAILY WITH A MEAL 60 tablet 5  . nitroGLYCERIN (NITROSTAT) 0.4 MG SL tablet Place 0.4 mg under the tongue every 5 (five) minutes as needed for chest pain.     Glory Rosebush DELICA LANCETS 77L MISC USE TO CHECK BLOOD SUGAR 1-2 TIMES DAILY. DX E11.65 100 each 2  . oxymetazoline (AFRIN NASAL SPRAY) 0.05 % nasal spray Place 2 sprays into both nostrils 2 (two) times daily as needed for congestion.  30 mL 0  . pantoprazole (PROTONIX) 40 MG tablet TAKE 1 TABLET(40 MG) BY MOUTH DAILY 30 tablet 5  . potassium chloride (K-DUR,KLOR-CON) 10 MEQ tablet Take 10 mEq by mouth daily as needed (swelling).      No current facility-administered medications on file prior to visit.     BP 101/60 (BP Location: Right Arm, Cuff Size: Large)   Pulse 78   Temp 98.2 F (36.8 C) (Oral)   Resp 18   Ht 5' 7" (1.702 m)   Wt 200 lb (90.7 kg)   SpO2 98%   BMI 31.32 kg/m       Objective:   Physical Exam  Constitutional: She is oriented to person, place, and time. She appears well-developed and well-nourished.  Cardiovascular: Normal rate, regular rhythm and normal heart sounds.   No murmur heard. Pulmonary/Chest: Effort normal and breath sounds normal. No respiratory distress. She has no wheezes.  Musculoskeletal: She exhibits no edema.  Neurological: She is alert and oriented to person, place, and time.  Psychiatric: She has a normal mood and affect. Her behavior is normal. Judgment and thought content normal.          Assessment & Plan:

## 2017-08-25 NOTE — Patient Instructions (Signed)
Please schedule an appointment with a counselor.  You can contact [facility]- [phone].  Please let me know if your mood/anxiety/insomnia worsens between now and your follow up appointment. Complete lab work prior to leaving.

## 2017-08-25 NOTE — Assessment & Plan Note (Signed)
Having mild depression/anxiety symptoms.  I think it is situational. We discussed establishing with a counselor and letting me know if new/worsening symptoms in the meantime.

## 2017-08-25 NOTE — Assessment & Plan Note (Signed)
Clinically stable, she continues to follow with Dr. Pasi in [place] (cardiology).

## 2017-08-25 NOTE — Assessment & Plan Note (Signed)
Clinically stable on metformin and januvia.  Continue same. Obtain follow up bmet and A1C.

## 2017-09-08 ENCOUNTER — Other Ambulatory Visit: Payer: Self-pay | Admitting: Family

## 2017-09-28 ENCOUNTER — Other Ambulatory Visit: Payer: Self-pay | Admitting: Family

## 2017-10-05 ENCOUNTER — Other Ambulatory Visit: Payer: Self-pay | Admitting: Family

## 2017-10-06 NOTE — Telephone Encounter (Signed)
Rx approved and sent to the pharmacy by e-script.//AB/CMA 

## 2017-10-07 ENCOUNTER — Other Ambulatory Visit: Payer: Self-pay | Admitting: Family

## 2017-10-10 DIAGNOSIS — C44319 Basal cell carcinoma of skin of other parts of face: Secondary | ICD-10-CM | POA: Diagnosis not present

## 2017-10-16 ENCOUNTER — Ambulatory Visit (INDEPENDENT_AMBULATORY_CARE_PROVIDER_SITE_OTHER): Payer: 59 | Admitting: Family

## 2017-10-16 ENCOUNTER — Encounter: Payer: Self-pay | Admitting: Family

## 2017-10-16 VITALS — BP 122/84 | HR 82 | Temp 98.2°F | Resp 18 | Ht 67.0 in | Wt 200.0 lb

## 2017-10-16 DIAGNOSIS — M549 Dorsalgia, unspecified: Secondary | ICD-10-CM

## 2017-10-16 DIAGNOSIS — R350 Frequency of micturition: Secondary | ICD-10-CM | POA: Diagnosis not present

## 2017-10-16 DIAGNOSIS — N3000 Acute cystitis without hematuria: Secondary | ICD-10-CM

## 2017-10-16 LAB — POC URINALSYSI DIPSTICK (AUTOMATED)
Bilirubin, UA: NEGATIVE
Blood, UA: NEGATIVE
Glucose, UA: NEGATIVE
Ketones, UA: NEGATIVE
Nitrite, UA: NEGATIVE
Protein, UA: NEGATIVE
Spec Grav, UA: >=1.03 — AB (ref 1.010–1.025)
Urobilinogen, UA: 0.2 E.U./dL
pH, UA: 6 (ref 5.0–8.0)

## 2017-10-16 MED ORDER — NITROFURANTOIN MONOHYD MACRO 100 MG PO CAPS: 100.0000 mg | ORAL_CAPSULE | Freq: Two times a day (BID) | ORAL | 0 refills | Status: DC

## 2017-10-16 MED ORDER — MELOXICAM 7.5 MG PO TABS: 7.5000 mg | ORAL_TABLET | Freq: Every day | ORAL | 0 refills | Status: DC

## 2017-10-16 NOTE — Patient Instructions (Addendum)
Please begin meloxicam once daily for low back pain. Pain appears to be musculoskeletal.  I have sent an rx for macrobid (antibiotic) for possible UTI.   Call if new/worsening symptoms or if symptoms are not improved in 1-2 weeks. Let me know if your sinus symptoms do not continue to improve.

## 2017-10-16 NOTE — Progress Notes (Signed)
Subjective:    Patient ID: Katrina Kelly, female    DOB: Oct 23, 1961, 56 y.o.   MRN: [id number]  HPI  Katrina Kelly is a 56 yr old female who presents today with chief complaint of right sided low back pain. Reports that pain has been present x 1 month.  Reports pain is stabbing at times- especially if she is rolling over in the bed and walking.  She denies dysuria. Has had frequency overnight.  Denies fever.   Reports sinus symptoms 1 month ago- then resolved.  About 1 week ago symptoms returned, now improving with use of sinus rinses.   Review of Systems See HPI  Past Medical History:  Diagnosis Date  . Allergy   . Anxiety   . Basal cell carcinoma    skin   . Cardiomyopathy ([place])    EF 35% per pt  . CHF (congestive heart failure) ([place])   . Diabetes type 2, uncontrolled ([place])   . Heartburn   . Hypertension      Social History   Social History  . Marital status: Married    Spouse name: N/A  . Number of children: N/A  . Years of education: N/A   Occupational History  . Not on file.   Social History Main Topics  . Smoking status: Former Research scientist (life sciences)  . Smokeless tobacco: Never Used  . Alcohol use 0.6 oz/week    1 Standard drinks or equivalent per week     Comment: twice monthly  . Drug use: No  . Sexual activity: Not on file   Other Topics Concern  . 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   Daughter/fiance and grandson live downstairs   Enjoys beach, decorating/yard work.   One dog       Past Surgical History:  Procedure Laterality Date  . CHOLECYSTECTOMY  2008  . COLONOSCOPY WITH PROPOFOL N/A 01/03/2017   Procedure: COLONOSCOPY WITH PROPOFOL;  Surgeon: Mauri Pole, MD;  Location: [facility];  Service: Endoscopy;  Laterality: N/A;  . FOOT SURGERY  1982   "benign tumor bottom of right foot"  . NASAL SEPTUM SURGERY  2000  . TONSILLECTOMY  2000  . uterine ablation    .  UVULECTOMY  2000    Family History  Problem Relation Age of Onset  . Diabetes Mother   . Hypertension Mother   . Cancer Father        lung  . Emphysema Father   . Diabetes Sister   . Diabetes Brother   . Leukemia Brother   . Stroke Brother   . Heart attack Maternal Uncle   . Breast cancer Paternal Aunt 39  . Lymphoma Paternal Uncle   . Polycystic ovary syndrome Daughter   . Hypertension Daughter   . Diabetes Mellitus II Maternal Grandmother   . Colon cancer Neg Hx     Allergies  Allergen Reactions  . Lactose Intolerance (Gi)     GI symptoms    Current Outpatient Prescriptions on File Prior to Visit  Medication Sig Dispense Refill  . aspirin EC 81 MG tablet Take 1 tablet (81 mg total) by mouth daily.    Marland Kitchen atorvastatin (LIPITOR) 10 MG tablet TAKE 1 TABLET(10 MG) BY MOUTH DAILY 30 tablet 5  . Blood Glucose Monitoring Suppl (ONETOUCH VERIO) w/Device KIT USE AS DIRECTED 1 kit 0  . carvedilol (COREG) 6.25 MG tablet Take 6.25  mg by mouth 2 (two) times daily.    . cetirizine (ZYRTEC) 10 MG tablet Take 10 mg by mouth daily.    . fluticasone (FLONASE) 50 MCG/ACT nasal spray Place 2 sprays into both nostrils daily. (Patient taking differently: Place 2 sprays into both nostrils daily as needed for allergies. ) 16 g 1  . furosemide (LASIX) 40 MG tablet Take 40 mg by mouth daily as needed for edema.     . isosorbide mononitrate (IMDUR) 30 MG 24 hr tablet Take 30 mg by mouth at bedtime.     Marland Kitchen JANUVIA 100 MG tablet TAKE 1 TABLET(100 MG) BY MOUTH DAILY 30 tablet 3  . lisinopril (PRINIVIL,ZESTRIL) 5 MG tablet Take 5 mg by mouth daily.    . metFORMIN (GLUCOPHAGE) 500 MG tablet TAKE 1 TABLET(500 MG) BY MOUTH TWICE DAILY WITH A MEAL 60 tablet 5  . nitroGLYCERIN (NITROSTAT) 0.4 MG SL tablet Place 0.4 mg under the tongue every 5 (five) minutes as needed for chest pain.     Glory Rosebush DELICA LANCETS 16X MISC USE TO CHECK BLOOD SUGAR 1-2 TIMES DAILY. DX E11.65 100 each 2  . oxymetazoline (AFRIN  NASAL SPRAY) 0.05 % nasal spray Place 2 sprays into both nostrils 2 (two) times daily as needed for congestion.  30 mL 0  . pantoprazole (PROTONIX) 40 MG tablet TAKE 1 TABLET(40 MG) BY MOUTH DAILY 30 tablet 5  . potassium chloride (K-DUR,KLOR-CON) 10 MEQ tablet Take 10 mEq by mouth daily as needed (swelling).      No current facility-administered medications on file prior to visit.     BP 122/84 (BP Location: Left Arm, Patient Position: Sitting, Cuff Size: Small)   Pulse 82   Temp 98.2 F (36.8 C) (Oral)   Resp 18   Ht _0  (1.702 m)   Wt 200 lb (90.7 kg)   SpO2 96%   BMI 31.32 kg/m      Objective:   Physical Exam  Constitutional: She is oriented to person, place, and time. She appears well-developed and well-nourished.  HENT:  Head: Atraumatic.  Right Ear: Tympanic membrane and ear canal normal.  Left Ear: Tympanic membrane and ear canal normal.  Nose: Right sinus exhibits no maxillary sinus tenderness and no frontal sinus tenderness. Left sinus exhibits no maxillary sinus tenderness and no frontal sinus tenderness.  Mouth/Throat: No oropharyngeal exudate, posterior oropharyngeal edema or posterior oropharyngeal erythema.  Cardiovascular: Normal rate, regular rhythm and normal heart sounds.   No murmur heard. Pulmonary/Chest: Effort normal and breath sounds normal. No respiratory distress. She has no wheezes.  Abdominal: There is no CVA tenderness.  Musculoskeletal: She exhibits no edema.  + right low back tenderness to palpation.   Neurological: She is alert and oriented to person, place, and time.  Skin: Skin is warm and dry.  Psychiatric: She has a normal mood and affect. Her behavior is normal. Judgment and thought content normal.          Assessment & Plan:  Musculoskeletal low back pain- New. Will rx with trial of meloxicam.   UTI- UA notes trace leukocytes.  Will begin macrobid. Send urine for culture.

## 2017-10-17 ENCOUNTER — Encounter: Payer: Self-pay | Admitting: Family

## 2017-10-17 LAB — URINE CULTURE
MICRO NUMBER:: 81227137
SPECIMEN QUALITY:: ADEQUATE

## 2017-11-24 ENCOUNTER — Ambulatory Visit: Payer: 59 | Admitting: Family

## 2017-12-26 DIAGNOSIS — J0191 Acute recurrent sinusitis, unspecified: Secondary | ICD-10-CM | POA: Diagnosis not present

## 2018-01-02 ENCOUNTER — Ambulatory Visit: Payer: 59 | Admitting: Family

## 2018-01-09 ENCOUNTER — Ambulatory Visit: Payer: 59 | Admitting: Family

## 2018-01-09 ENCOUNTER — Encounter: Payer: Self-pay | Admitting: Family

## 2018-01-09 VITALS — BP 113/68 | HR 91 | Temp 98.4°F | Resp 18 | Ht 67.0 in | Wt 203.4 lb

## 2018-01-09 DIAGNOSIS — K219 Gastro-esophageal reflux disease without esophagitis: Secondary | ICD-10-CM

## 2018-01-09 DIAGNOSIS — E119 Type 2 diabetes mellitus without complications: Secondary | ICD-10-CM | POA: Diagnosis not present

## 2018-01-09 DIAGNOSIS — E785 Hyperlipidemia, unspecified: Secondary | ICD-10-CM | POA: Diagnosis not present

## 2018-01-09 DIAGNOSIS — I10 Essential (primary) hypertension: Secondary | ICD-10-CM

## 2018-01-09 DIAGNOSIS — F439 Reaction to severe stress, unspecified: Secondary | ICD-10-CM

## 2018-01-09 LAB — BASIC METABOLIC PANEL
BUN: 13 mg/dL (ref 6–23)
CO2: 25 mEq/L (ref 19–32)
Calcium: 9.3 mg/dL (ref 8.4–10.5)
Chloride: 104 mEq/L (ref 96–112)
Creatinine, Ser: 0.88 mg/dL (ref 0.40–1.20)
GFR: 70.56 mL/min (ref >60.00)
Glucose, Bld: 133 mg/dL — ABNORMAL HIGH (ref 70–99)
Potassium: 3.9 mEq/L (ref 3.5–5.1)
Sodium: 140 mEq/L (ref 135–145)

## 2018-01-09 LAB — HEMOGLOBIN A1C: Hgb A1c MFr Bld: 6.4 % (ref 4.6–6.5)

## 2018-01-09 MED ORDER — SITAGLIPTIN PHOSPHATE 100 MG PO TABS: ORAL_TABLET | ORAL | 1 refills | Status: DC

## 2018-01-09 NOTE — Progress Notes (Signed)
Subjective:    Patient ID: Katrina Kelly, female    DOB: 01/14/1961, 57 y.o.   MRN: [id number]  HPI  DM2- maintained on metformin and Januvia. Home sugars 120-140.  Denies hypoglycemia.  Lab Results  Component Value Date   HGBA1C 6.2 08/25/2017   HGBA1C 6.8 (H) 05/26/2017   HGBA1C 7.4 (H) 02/24/2017   Lab Results  Component Value Date   MICROALBUR 0.7 08/25/2017   LDLCALC 69 05/26/2017   CREATININE 0.90 08/25/2017   Hyperlipidemia- continues atorvastatin. Denies myalgia.  Lab Results  Component Value Date   CHOL 130 05/26/2017   HDL 32.10 (L) 05/26/2017   LDLCALC 69 05/26/2017   TRIG 146.0 05/26/2017   CHOLHDL 4 05/26/2017   She does report some situational stress.  Mom is in failing health,  Brother has chf following chemo for leukemia,  Husband had several surgeries and has not been working regularly.    GERD- maintained on PPI (protonix).  Reports symptoms well controlled.    Review of Systems See HPI  Past Medical History:  Diagnosis Date  . Allergy   . Anxiety   . Basal cell carcinoma    skin   . Cardiomyopathy ([place])    EF 35% per pt  . CHF (congestive heart failure) ([place])   . Diabetes type 2, uncontrolled ([place])   . Heartburn   . Hypertension      Social History   Socioeconomic History  . Marital status: Married    Spouse name: Not on file  . Number of children: Not on file  . Years of education: Not on file  . Highest education level: Not on file  Social Needs  . Financial resource strain: Not on file  . Food insecurity - worry: Not on file  . Food insecurity - inability: Not on file  . Transportation needs - medical: Not on file  . Transportation needs - non-medical: Not on file  Occupational History  . Not on file  Tobacco Use  . Smoking status: Former Research scientist (life sciences)  . Smokeless tobacco: Never Used  Substance and Sexual Activity  . Alcohol use: Yes    Alcohol/week: 0.6 oz    Types: 1 Standard drinks or equivalent per week    Comment: twice  monthly  . Drug use: No  . Sexual activity: Not on file  Other Topics Concern  . 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   Daughter/fiance and grandson live downstairs   Enjoys beach, decorating/yard work.   One dog    Past Surgical History:  Procedure Laterality Date  . CHOLECYSTECTOMY  2008  . COLONOSCOPY WITH PROPOFOL N/A 01/03/2017   Procedure: COLONOSCOPY WITH PROPOFOL;  Surgeon: Mauri Pole, MD;  Location: [facility];  Service: Endoscopy;  Laterality: N/A;  . FOOT SURGERY  1982   "benign tumor bottom of right foot"  . NASAL SEPTUM SURGERY  2000  . TONSILLECTOMY  2000  . uterine ablation    . UVULECTOMY  2000    Family History  Problem Relation Age of Onset  . Diabetes Mother   . Hypertension Mother   . Cancer Father        lung  . Emphysema Father   . Diabetes Sister   . Diabetes Brother   . Leukemia Brother   . Stroke Brother   . Heart attack Maternal Uncle   . Breast cancer Paternal Aunt 30  .  Lymphoma Paternal Uncle   . Polycystic ovary syndrome Daughter   . Hypertension Daughter   . Diabetes Mellitus II Maternal Grandmother   . Colon cancer Neg Hx     Allergies  Allergen Reactions  . Lactose Intolerance (Gi)     GI symptoms    Current Outpatient Medications on File Prior to Visit  Medication Sig Dispense Refill  . aspirin EC 81 MG tablet Take 1 tablet (81 mg total) by mouth daily.    Marland Kitchen atorvastatin (LIPITOR) 10 MG tablet TAKE 1 TABLET(10 MG) BY MOUTH DAILY 30 tablet 5  . Blood Glucose Monitoring Suppl (ONETOUCH VERIO) w/Device KIT USE AS DIRECTED 1 kit 0  . carvedilol (COREG) 6.25 MG tablet Take 6.25 mg by mouth 2 (two) times daily.    . cetirizine (ZYRTEC) 10 MG tablet Take 10 mg by mouth daily.    . fluticasone (FLONASE) 50 MCG/ACT nasal spray Place 2 sprays into both nostrils daily. (Patient taking differently: Place 2 sprays into both nostrils daily as  needed for allergies. ) 16 g 1  . furosemide (LASIX) 40 MG tablet Take 40 mg by mouth daily as needed for edema.     . isosorbide mononitrate (IMDUR) 30 MG 24 hr tablet Take 30 mg by mouth at bedtime.     Marland Kitchen JANUVIA 100 MG tablet TAKE 1 TABLET(100 MG) BY MOUTH DAILY 30 tablet 3  . lisinopril (PRINIVIL,ZESTRIL) 5 MG tablet Take 5 mg by mouth daily.    . meloxicam (MOBIC) 7.5 MG tablet Take 1 tablet (7.5 mg total) by mouth daily. 14 tablet 0  . metFORMIN (GLUCOPHAGE) 500 MG tablet TAKE 1 TABLET(500 MG) BY MOUTH TWICE DAILY WITH A MEAL 60 tablet 5  . ONETOUCH DELICA LANCETS 29J MISC USE TO CHECK BLOOD SUGAR 1-2 TIMES DAILY. DX E11.65 100 each 2  . pantoprazole (PROTONIX) 40 MG tablet TAKE 1 TABLET(40 MG) BY MOUTH DAILY 30 tablet 5  . potassium chloride (K-DUR,KLOR-CON) 10 MEQ tablet Take 10 mEq by mouth daily as needed (swelling).     . nitroGLYCERIN (NITROSTAT) 0.4 MG SL tablet Place 0.4 mg under the tongue every 5 (five) minutes as needed for chest pain.      No current facility-administered medications on file prior to visit.     BP 113/68 (BP Location: Right Arm, Cuff Size: Large)   Pulse 91   Temp 98.4 F (36.9 C) (Oral)   Resp 18   Ht 5' 7"  (1.702 m)   Wt 203 lb 6.4 oz (92.3 kg)   SpO2 97%   BMI 31.86 kg/m       Objective:   Physical Exam  Constitutional: She is oriented to person, place, and time. She appears well-developed and well-nourished.  HENT:  Head: Normocephalic and atraumatic.  Cardiovascular: Normal rate, regular rhythm and normal heart sounds.  No murmur heard. Pulmonary/Chest: Effort normal and breath sounds normal. No respiratory distress. She has no wheezes.  Musculoskeletal: She exhibits no edema.  Neurological: She is alert and oriented to person, place, and time.  Skin: Skin is warm and dry.  Psychiatric: She has a normal mood and affect. Her behavior is normal. Judgment and thought content normal.          Assessment & Plan:  Diabetes type  2-clinically stable on current medications.  Continue same.  Obtain follow-up A1c.  Hypertension-blood pressure stable continue current meds.  Hyperlipidemia-tolerating statin.  HDL was low last time we were checked however LDL was at goal.  We  discussed importance of exercise in raising HDL.  Situational stress-scored 7 on PHQ-9  GERD stable on proton pump inhibitor.  Continue same.

## 2018-01-09 NOTE — Patient Instructions (Signed)
Please complete lab work prior to leaving.   

## 2018-02-09 DIAGNOSIS — L820 Inflamed seborrheic keratosis: Secondary | ICD-10-CM | POA: Diagnosis not present

## 2018-02-09 DIAGNOSIS — L570 Actinic keratosis: Secondary | ICD-10-CM | POA: Diagnosis not present

## 2018-02-09 DIAGNOSIS — Z08 Encounter for follow-up examination after completed treatment for malignant neoplasm: Secondary | ICD-10-CM | POA: Diagnosis not present

## 2018-04-03 ENCOUNTER — Other Ambulatory Visit: Payer: Self-pay | Admitting: Family

## 2018-04-10 ENCOUNTER — Encounter: Payer: Self-pay | Admitting: Family

## 2018-04-10 ENCOUNTER — Ambulatory Visit: Payer: 59 | Admitting: Family

## 2018-04-10 ENCOUNTER — Other Ambulatory Visit: Payer: Self-pay | Admitting: Family

## 2018-04-10 VITALS — BP 98/61 | HR 83 | Temp 98.4°F | Resp 18 | Ht 67.0 in | Wt 201.0 lb

## 2018-04-10 DIAGNOSIS — R0789 Other chest pain: Secondary | ICD-10-CM | POA: Diagnosis not present

## 2018-04-10 DIAGNOSIS — I509 Heart failure, unspecified: Secondary | ICD-10-CM

## 2018-04-10 DIAGNOSIS — E119 Type 2 diabetes mellitus without complications: Secondary | ICD-10-CM | POA: Diagnosis not present

## 2018-04-10 DIAGNOSIS — E785 Hyperlipidemia, unspecified: Secondary | ICD-10-CM | POA: Diagnosis not present

## 2018-04-10 DIAGNOSIS — I10 Essential (primary) hypertension: Secondary | ICD-10-CM

## 2018-04-10 LAB — COMPREHENSIVE METABOLIC PANEL
ALT: 26 U/L (ref 0–35)
AST: 28 U/L (ref 0–37)
Albumin: 3.9 g/dL (ref 3.5–5.2)
Alkaline Phosphatase: 76 U/L (ref 39–117)
BUN: 11 mg/dL (ref 6–23)
CO2: 25 mEq/L (ref 19–32)
Calcium: 9.2 mg/dL (ref 8.4–10.5)
Chloride: 105 mEq/L (ref 96–112)
Creatinine, Ser: 0.88 mg/dL (ref 0.40–1.20)
GFR: 70.49 mL/min (ref >60.00)
Glucose, Bld: 123 mg/dL — ABNORMAL HIGH (ref 70–99)
Potassium: 4 mEq/L (ref 3.5–5.1)
Sodium: 140 mEq/L (ref 135–145)
Total Bilirubin: 0.7 mg/dL (ref 0.2–1.2)
Total Protein: 7.2 g/dL (ref 6.0–8.3)

## 2018-04-10 LAB — LIPID PANEL
Cholesterol: 109 mg/dL (ref 0–200)
HDL: 31.9 mg/dL — ABNORMAL LOW (ref >39.00)
LDL Cholesterol: 51 mg/dL (ref 0–99)
NonHDL: 77.16
Total CHOL/HDL Ratio: 3
Triglycerides: 133 mg/dL (ref 0.0–149.0)
VLDL: 26.6 mg/dL (ref 0.0–40.0)

## 2018-04-10 LAB — HEMOGLOBIN A1C: Hgb A1c MFr Bld: 6.2 % (ref 4.6–6.5)

## 2018-04-10 MED ORDER — CARVEDILOL 3.125 MG PO TABS: 3.1250 mg | ORAL_TABLET | Freq: Two times a day (BID) | ORAL | 3 refills | Status: DC

## 2018-04-10 NOTE — Progress Notes (Signed)
Subjective:    Patient ID: Katrina Kelly, female    DOB: 13-Sep-1961, 57 y.o.   MRN: [id number]  HPI  Pt is a 57 yr old female who presents today for follow up.  DM2-patient is maintained on metformin 500 mg twice daily as well as Januvia 100 mg once daily. Home readings 120-140.   Lab Results  Component Value Date   HGBA1C 6.4 01/09/2018   HGBA1C 6.2 08/25/2017   HGBA1C 6.8 (H) 05/26/2017   Lab Results  Component Value Date   MICROALBUR 0.7 08/25/2017   LDLCALC 69 05/26/2017   CREATININE 0.88 01/09/2018   Hypertension-maintained on imdur, carvedilol 6.25 mg twice daily as well as lisinopril 5 mg once daily. She reports increased stress lately. Has had some chest pressure which she attributes to anxiety.   BP Readings from Last 3 Encounters:  04/10/18 98/61  01/09/18 113/68  10/16/17 122/84   Hyperlipidemia-continues Lipitor 10 mg once daily. Lab Results  Component Value Date   CHOL 130 05/26/2017   HDL 32.10 (L) 05/26/2017   LDLCALC 69 05/26/2017   TRIG 146.0 05/26/2017   CHOLHDL 4 05/26/2017   GERD- continues Protonix 40 mg once daily.   Review of Systems    see HPI  Past Medical History:  Diagnosis Date  . Allergy   . Anxiety   . Basal cell carcinoma    skin   . Cardiomyopathy ([place])    EF 35% per pt  . CHF (congestive heart failure) ([place])   . Diabetes type 2, uncontrolled ([place])   . Heartburn   . Hypertension      Social History   Socioeconomic History  . Marital status: Married    Spouse name: Not on file  . Number of children: Not on file  . Years of education: Not on file  . Highest education level: Not on file  Occupational History  . Not on file  Social Needs  . Financial resource strain: Not on file  . Food insecurity:    Worry: Not on file    Inability: Not on file  . Transportation needs:    Medical: Not on file    Non-medical: Not on file  Tobacco Use  . Smoking status: Former Research scientist (life sciences)  . Smokeless tobacco: Never Used  Substance  and Sexual Activity  . Alcohol use: Yes    Alcohol/week: 0.6 oz    Types: 1 Standard drinks or equivalent per week    Comment: twice monthly  . Drug use: No  . Sexual activity: Not on file  Lifestyle  . Physical activity:    Days per week: Not on file    Minutes per session: Not on file  . Stress: Not on file  Relationships  . Social connections:    Talks on phone: Not on file    Gets together: Not on file    Attends religious service: Not on file    Active member of club or organization: Not on file    Attends meetings of clubs or organizations: Not on file    Relationship status: Not on file  . Intimate partner violence:    Fear of current or ex partner: Not on file    Emotionally abused: Not on file    Physically abused: Not on file    Forced sexual activity: Not on file  Other Topics Concern  . 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   Daughter/fiance and grandson live downstairs   Enjoys beach, decorating/yard work.   One dog    Past Surgical History:  Procedure Laterality Date  . CHOLECYSTECTOMY  2008  . COLONOSCOPY WITH PROPOFOL N/A 01/03/2017   Procedure: COLONOSCOPY WITH PROPOFOL;  Surgeon: Mauri Pole, MD;  Location: [facility];  Service: Endoscopy;  Laterality: N/A;  . FOOT SURGERY  1982   "benign tumor bottom of right foot"  . NASAL SEPTUM SURGERY  2000  . TONSILLECTOMY  2000  . uterine ablation    . UVULECTOMY  2000    Family History  Problem Relation Age of Onset  . Diabetes Mother   . Hypertension Mother   . Cancer Father        lung  . Emphysema Father   . Diabetes Sister   . Diabetes Brother   . Leukemia Brother   . Stroke Brother   . Heart attack Maternal Uncle   . Breast cancer Paternal Aunt 80  . Lymphoma Paternal Uncle   . Polycystic ovary syndrome Daughter   . Hypertension Daughter   . Diabetes Mellitus II Maternal Grandmother   . Colon  cancer Neg Hx     Allergies  Allergen Reactions  . Lactose Intolerance (Gi)     GI symptoms    Current Outpatient Medications on File Prior to Visit  Medication Sig Dispense Refill  . aspirin EC 81 MG tablet Take 1 tablet (81 mg total) by mouth daily.    Marland Kitchen atorvastatin (LIPITOR) 10 MG tablet TAKE 1 TABLET(10 MG) BY MOUTH DAILY 30 tablet 0  . Blood Glucose Monitoring Suppl (ONETOUCH VERIO) w/Device KIT USE AS DIRECTED 1 kit 0  . cetirizine (ZYRTEC) 10 MG tablet Take 10 mg by mouth daily.    . fluticasone (FLONASE) 50 MCG/ACT nasal spray Place 2 sprays into both nostrils daily. (Patient taking differently: Place 2 sprays into both nostrils daily as needed for allergies. ) 16 g 1  . furosemide (LASIX) 40 MG tablet Take 40 mg by mouth daily as needed for edema.     . isosorbide mononitrate (IMDUR) 30 MG 24 hr tablet Take 30 mg by mouth at bedtime.     Marland Kitchen lisinopril (PRINIVIL,ZESTRIL) 5 MG tablet Take 5 mg by mouth daily.    . metFORMIN (GLUCOPHAGE) 500 MG tablet TAKE 1 TABLET(500 MG) BY MOUTH TWICE DAILY WITH A MEAL 60 tablet 0  . ONETOUCH DELICA LANCETS 33I MISC USE TO CHECK BLOOD SUGAR 1-2 TIMES DAILY. DX E11.65 100 each 2  . pantoprazole (PROTONIX) 40 MG tablet TAKE 1 TABLET(40 MG) BY MOUTH DAILY 30 tablet 5  . potassium chloride (K-DUR,KLOR-CON) 10 MEQ tablet Take 10 mEq by mouth daily as needed (swelling).     . sitaGLIPtin (JANUVIA) 100 MG tablet TAKE 1 TABLET(100 MG) BY MOUTH DAILY 90 tablet 1  . nitroGLYCERIN (NITROSTAT) 0.4 MG SL tablet Place 0.4 mg under the tongue every 5 (five) minutes as needed for chest pain.      No current facility-administered medications on file prior to visit.     BP 98/61 (BP Location: Right Arm, Cuff Size: Large)   Pulse 83   Temp 98.4 F (36.9 C) (Oral)   Resp 18   Ht _0  (1.702 m)   Wt 201 lb (91.2 kg)   SpO2 98%   BMI 31.48 kg/m    Objective:   Physical Exam  Constitutional: She is oriented to person, place, and time. She appears  well-developed and  well-nourished.  HENT:  Head: Normocephalic and atraumatic.  Cardiovascular: Normal rate, regular rhythm and normal heart sounds.  No murmur heard. Pulmonary/Chest: Effort normal and breath sounds normal. No respiratory distress. She has no wheezes.  Musculoskeletal: She exhibits no edema.  Neurological: She is alert and oriented to person, place, and time.  Psychiatric: She has a normal mood and affect. Her behavior is normal. Judgment and thought content normal.          Assessment & Plan:  Diabetes type 2 clinically stable on current medications.  Will obtain follow-up A1c.  History of CHF-followed by cardiology.  Appears euvolemic today.  Will adjust down her Coreg due to low blood pressure.  Hypertension-appears overtreated at this time.  Decrease Coreg from 6.25 down to 3.125 mg twice daily.  Continue low-dose ACE for renal protection.  Atypical chest pain-no chest pain today. EKG tracing is personally reviewed.  EKG notes NSR.  No acute changes.  I have advised the patient to follow-up with her primary cardiologist.  She is advised to go to the emergency department should she develop recurrent chest pain.

## 2018-04-10 NOTE — Patient Instructions (Addendum)
Please scheduled your diabetic eye exam soon. Please complete lab work prior to leaving.  Please decrease your carvedilol down to 3.125mg  tabs twice daily.  Go to the ER if you develop recurrent chest pain. We will arrange follow up with your cardiologist. Please let us know if you have not heard back about this appointment in 1 week.

## 2018-04-12 ENCOUNTER — Encounter: Payer: Self-pay | Admitting: Family

## 2018-05-02 ENCOUNTER — Other Ambulatory Visit: Payer: Self-pay | Admitting: Family

## 2018-05-15 ENCOUNTER — Ambulatory Visit: Payer: 59 | Admitting: Family

## 2018-06-01 ENCOUNTER — Telehealth: Payer: Self-pay

## 2018-06-01 NOTE — Telephone Encounter (Signed)
PA initiated via Covermymeds; KEY: [id number]. Awaiting determination.

## 2018-06-02 DIAGNOSIS — Z01419 Encounter for gynecological examination (general) (routine) without abnormal findings: Secondary | ICD-10-CM | POA: Diagnosis not present

## 2018-06-02 DIAGNOSIS — Z1231 Encounter for screening mammogram for malignant neoplasm of breast: Secondary | ICD-10-CM | POA: Diagnosis not present

## 2018-06-02 DIAGNOSIS — Z130 Encounter for screening for diseases of the blood and blood-forming organs and certain disorders involving the immune mechanism: Secondary | ICD-10-CM | POA: Diagnosis not present

## 2018-06-03 DIAGNOSIS — R87619 Unspecified abnormal cytological findings in specimens from cervix uteri: Secondary | ICD-10-CM | POA: Diagnosis not present

## 2018-06-03 DIAGNOSIS — R87613 High grade squamous intraepithelial lesion on cytologic smear of cervix (HGSIL): Secondary | ICD-10-CM | POA: Diagnosis not present

## 2018-06-04 MED ORDER — SAXAGLIPTIN HCL 5 MG PO TABS: 5.0000 mg | ORAL_TABLET | Freq: Every day | ORAL | 5 refills | Status: DC

## 2018-06-04 NOTE — Telephone Encounter (Signed)
PA denied. Preferred alternatives: [place], or Onglyza.

## 2018-06-04 NOTE — Telephone Encounter (Signed)
Please advised pt that [place] is not covered. I sent rx for onglyza once daily instead.

## 2018-06-05 NOTE — Telephone Encounter (Signed)
Notified pt and she voices understanding. She will call if she has any side effects with onglyza.

## 2018-06-12 LAB — HM DIABETES EYE EXAM

## 2018-06-18 IMAGING — MG DIGITAL SCREENING BILATERAL MAMMOGRAM WITH CAD
4 series · 4 of 4 positions shown · non-contrast
Comparison: Previous exam(s).

CLINICAL DATA: Screening.

EXAM:
DIGITAL SCREENING BILATERAL MAMMOGRAM WITH CAD

[L CC]
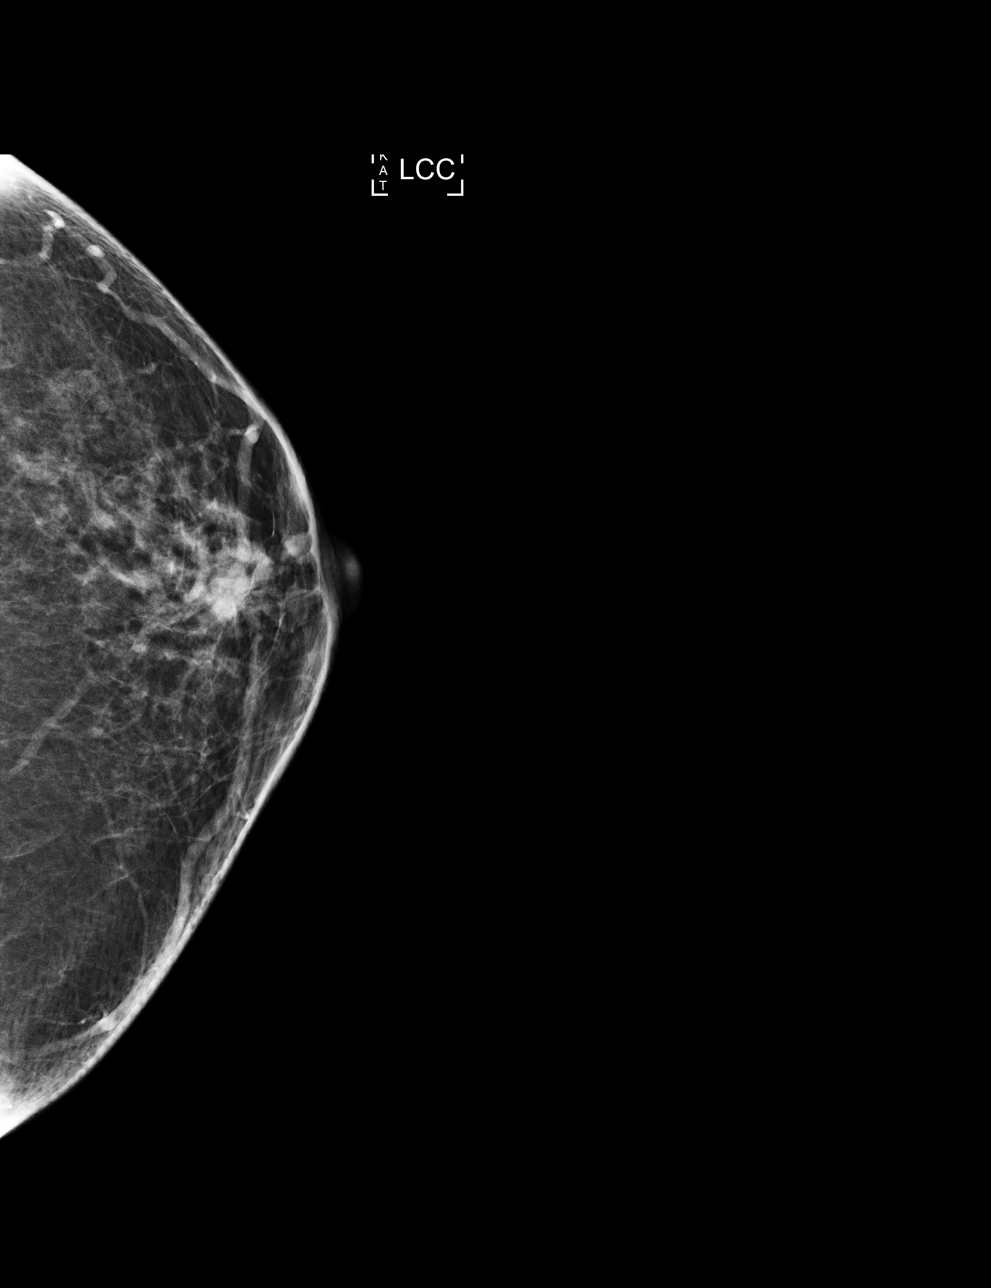

[L MLO]
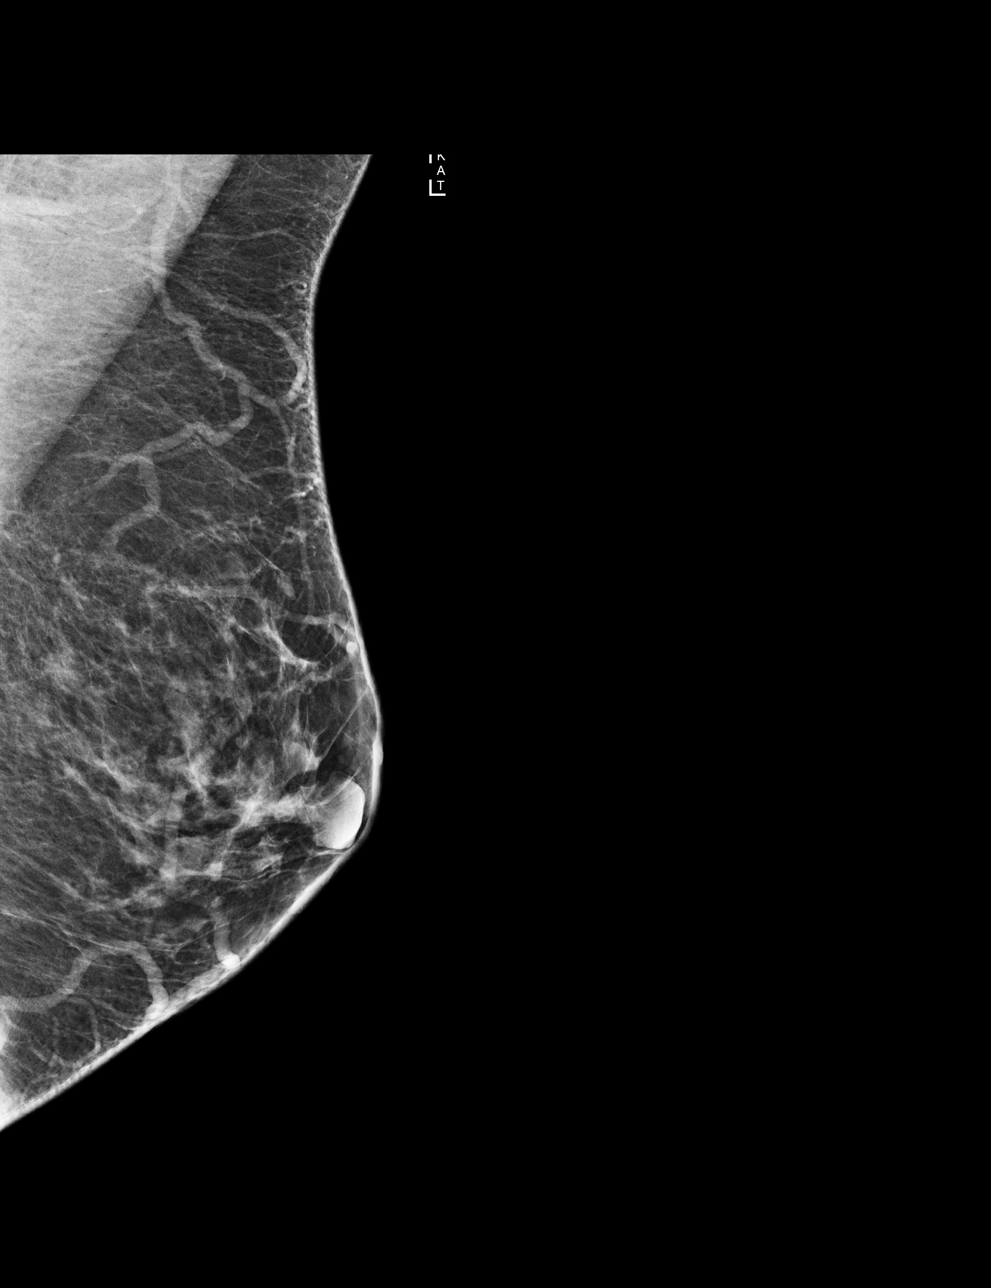

[R MLO]
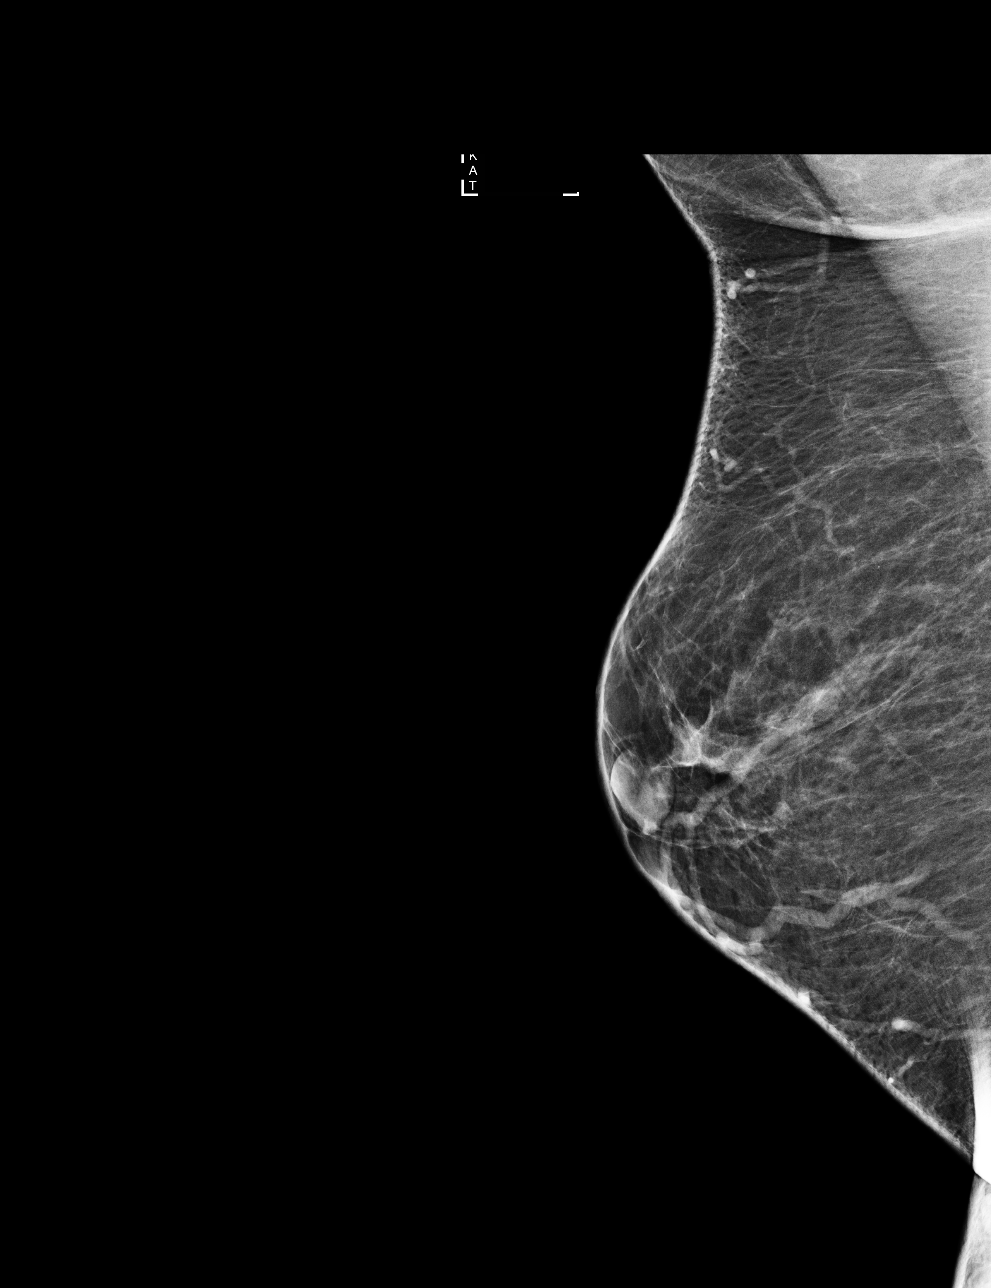

[R CC]
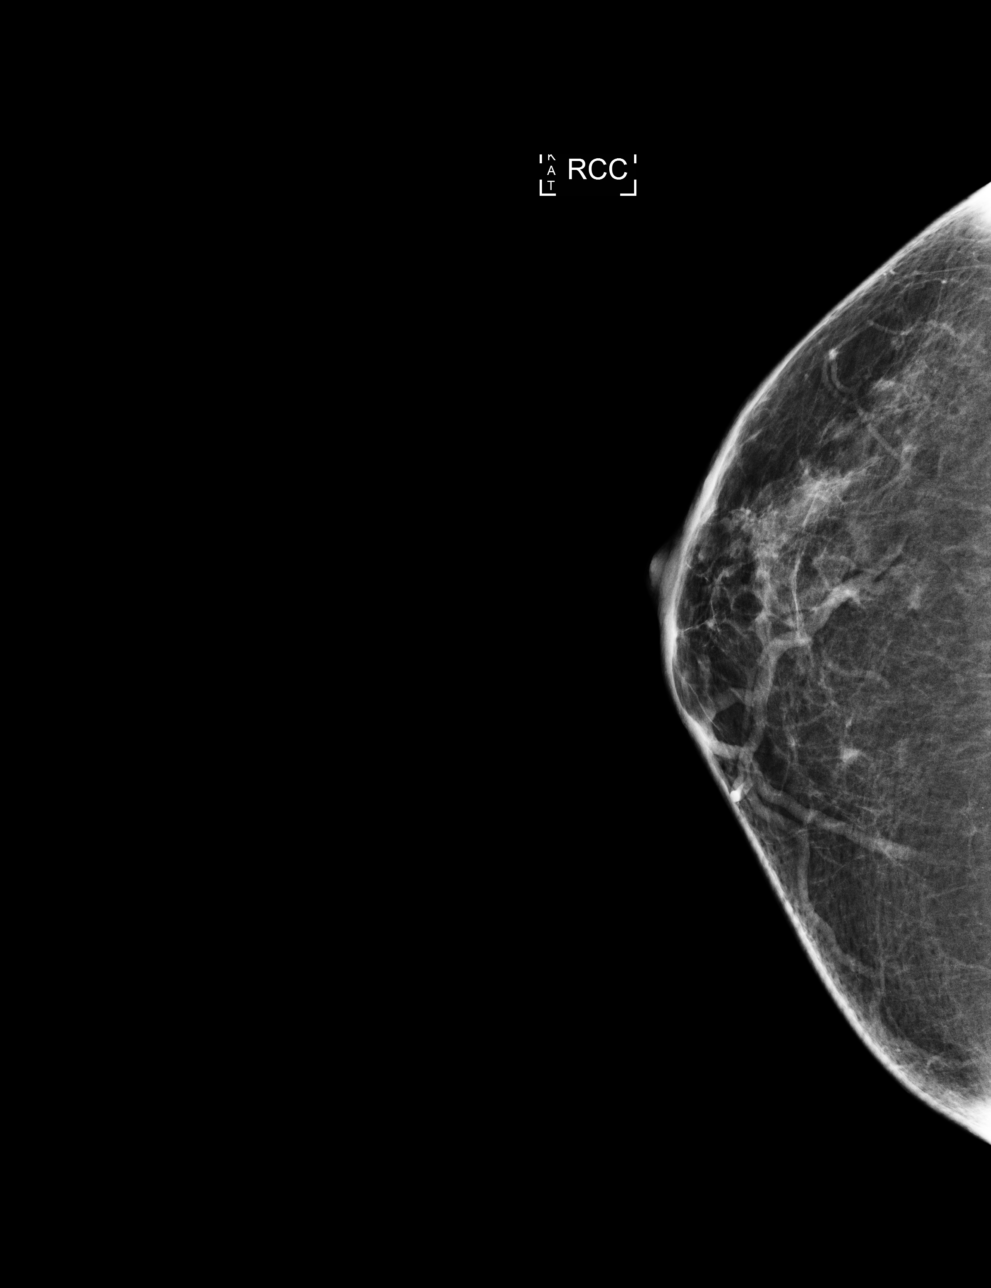

[4 of 4 positions shown; findings below may reference images not displayed]

ACR Breast Density Category c: The breast tissue is heterogeneously
dense, which may obscure small masses.
FINDINGS: There are no findings suspicious for malignancy. Images were
processed with CAD.
IMPRESSION: No mammographic evidence of malignancy. A result letter of this
screening mammogram will be mailed directly to the patient.

RECOMMENDATION:
Screening mammogram in one year. (Code:[id number]-[id number])

BI-RADS CATEGORY  1: Negative.

## 2018-07-20 ENCOUNTER — Other Ambulatory Visit: Payer: Self-pay | Admitting: Family

## 2018-07-20 NOTE — Telephone Encounter (Signed)
30 Day supply atorvastatin sent to pharmacy. Pt is past due for follow up with Melissa. Please call pt to schedule appt soon as further refills may not be approved until she is seen in the office.  Thanks!

## 2018-07-20 NOTE — Telephone Encounter (Signed)
Called pt and scheduled appt for 08/10/18

## 2018-07-27 DIAGNOSIS — R87619 Unspecified abnormal cytological findings in specimens from cervix uteri: Secondary | ICD-10-CM | POA: Diagnosis not present

## 2018-07-27 DIAGNOSIS — N871 Moderate cervical dysplasia: Secondary | ICD-10-CM | POA: Diagnosis not present

## 2018-07-27 DIAGNOSIS — Z3202 Encounter for pregnancy test, result negative: Secondary | ICD-10-CM | POA: Diagnosis not present

## 2018-08-10 ENCOUNTER — Ambulatory Visit: Payer: 59 | Admitting: Family

## 2018-08-10 ENCOUNTER — Encounter: Payer: Self-pay | Admitting: Family

## 2018-08-10 VITALS — BP 104/66 | HR 90 | Temp 98.2°F | Resp 18 | Ht 67.0 in | Wt 203.2 lb

## 2018-08-10 DIAGNOSIS — I10 Essential (primary) hypertension: Secondary | ICD-10-CM | POA: Diagnosis not present

## 2018-08-10 DIAGNOSIS — E119 Type 2 diabetes mellitus without complications: Secondary | ICD-10-CM | POA: Diagnosis not present

## 2018-08-10 DIAGNOSIS — R87619 Unspecified abnormal cytological findings in specimens from cervix uteri: Secondary | ICD-10-CM

## 2018-08-10 MED ORDER — ONETOUCH VERIO W/DEVICE KIT: PACK | 0 refills | Status: DC

## 2018-08-10 NOTE — Progress Notes (Signed)
Subjective:    Patient ID: Katrina Kelly, female    DOB: [birth year], 57 y.o.   MRN: [id number]  HPI   Patient is a 57 yr old female who presents today for follow up . Reports that she has had a lot of stress recently- mother died and husband had surgery with post-op infection.   HTN- maintained on carvedilol, imdur, low dose lisinopril/  BP Readings from Last 3 Encounters:  08/10/18 104/66  04/10/18 98/61  01/09/18 113/68   DM2- not checking her sugar because her meter is broken.  Lab Results  Component Value Date   HGBA1C 6.2 04/10/2018   HGBA1C 6.4 01/09/2018   HGBA1C 6.2 08/25/2017   Lab Results  Component Value Date   MICROALBUR 0.7 08/25/2017   LDLCALC 51 04/10/2018   CREATININE 0.88 04/10/2018   Reports that she had abnormal pap and will have colposcopy procedure with Dr. Kara Mead.    Review of Systems   Past Medical History:  Diagnosis Date  . Allergy   . Anxiety   . Basal cell carcinoma    skin   . Cardiomyopathy ([place])    EF 35% per pt  . CHF (congestive heart failure) ([place])   . Diabetes type 2, uncontrolled ([place])   . Heartburn   . Hypertension      Social History   Socioeconomic History  . Marital status: Married    Spouse name: Not on file  . Number of children: Not on file  . Years of education: Not on file  . Highest education level: Not on file  Occupational History  . Not on file  Social Needs  . Financial resource strain: Not on file  . Food insecurity:    Worry: Not on file    Inability: Not on file  . Transportation needs:    Medical: Not on file    Non-medical: Not on file  Tobacco Use  . Smoking status: Former Research scientist (life sciences)  . Smokeless tobacco: Never Used  Substance and Sexual Activity  . Alcohol use: Yes    Alcohol/week: 1.0 standard drinks    Types: 1 Standard drinks or equivalent per week    Comment: twice monthly  . Drug use: No  . Sexual activity: Not on file  Lifestyle  . Physical activity:    Days per week: Not on file    Minutes per session: Not on file  . Stress: Not on file  Relationships  . Social connections:    Talks on phone: Not on file    Gets together: Not on file    Attends religious service: Not on file    Active member of club or organization: Not on file    Attends meetings of clubs or organizations: Not on file    Relationship status: Not on file  . Intimate partner violence:    Fear of current or ex partner: Not on file    Emotionally abused: Not on file    Physically abused: Not on file    Forced sexual activity: Not on file  Other Topics Concern  . 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   Daughter/fiance and grandson live downstairs   Enjoys beach, decorating/yard work.   One dog    Past Surgical History:  Procedure Laterality Date  . CHOLECYSTECTOMY  2008  . COLONOSCOPY WITH PROPOFOL N/A 01/03/2017   Procedure: COLONOSCOPY WITH PROPOFOL;  Surgeon: Karleen Hampshire  Bary Richard, MD;  Location: [facility];  Service: Endoscopy;  Laterality: N/A;  . FOOT SURGERY  1982   "benign tumor bottom of right foot"  . NASAL SEPTUM SURGERY  2000  . TONSILLECTOMY  2000  . uterine ablation    . UVULECTOMY  2000    Family History  Problem Relation Age of Onset  . Diabetes Mother   . Hypertension Mother   . Cancer Father        lung  . Emphysema Father   . Diabetes Sister   . Diabetes Brother   . Leukemia Brother   . Stroke Brother   . Heart attack Maternal Uncle   . Breast cancer Paternal Aunt 5  . Lymphoma Paternal Uncle   . Polycystic ovary syndrome Daughter   . Hypertension Daughter   . Diabetes Mellitus II Maternal Grandmother   . Colon cancer Neg Hx     Allergies  Allergen Reactions  . Lactose Intolerance (Gi)     GI symptoms    Current Outpatient Medications on File Prior to Visit  Medication Sig Dispense Refill  . atorvastatin (LIPITOR) 10 MG tablet TAKE 1 TABLET(10 MG) BY MOUTH DAILY 30  tablet 0  . carvedilol (COREG) 3.125 MG tablet Take 1 tablet (3.125 mg total) by mouth 2 (two) times daily with a meal. 60 tablet 3  . cetirizine (ZYRTEC) 10 MG tablet Take 10 mg by mouth daily.    . fluticasone (FLONASE) 50 MCG/ACT nasal spray Place 2 sprays into both nostrils daily. (Patient taking differently: Place 2 sprays into both nostrils daily as needed for allergies. ) 16 g 1  . furosemide (LASIX) 40 MG tablet Take 40 mg by mouth daily as needed for edema.     . isosorbide mononitrate (IMDUR) 30 MG 24 hr tablet Take 30 mg by mouth at bedtime.     Marland Kitchen lisinopril (PRINIVIL,ZESTRIL) 5 MG tablet Take 5 mg by mouth daily.    . metFORMIN (GLUCOPHAGE) 500 MG tablet TAKE 1 TABLET(500 MG) BY MOUTH TWICE DAILY WITH A MEAL 60 tablet 2  . ONETOUCH DELICA LANCETS 35H MISC USE TO CHECK BLOOD SUGAR 1-2 TIMES DAILY. DX E11.65 100 each 2  . pantoprazole (PROTONIX) 40 MG tablet TAKE 1 TABLET(40 MG) BY MOUTH DAILY 30 tablet 5  . potassium chloride (K-DUR,KLOR-CON) 10 MEQ tablet Take 10 mEq by mouth daily as needed (swelling).     . saxagliptin HCl (ONGLYZA) 5 MG TABS tablet Take 1 tablet (5 mg total) by mouth daily. 30 tablet 5  . nitroGLYCERIN (NITROSTAT) 0.4 MG SL tablet Place 0.4 mg under the tongue every 5 (five) minutes as needed for chest pain.      No current facility-administered medications on file prior to visit.     BP 104/66 (BP Location: Left Arm, Cuff Size: Large)   Pulse 90   Temp 98.2 F (36.8 C) (Oral)   Resp 18   Ht 5\' 7"  (1.702 m)   Wt 203 lb 3.2 oz (92.2 kg)   SpO2 97%   BMI 31.83 kg/m        Objective:   Physical Exam  Constitutional: She is oriented to person, place, and time. She appears well-developed and well-nourished.  Cardiovascular: Normal rate, regular rhythm and normal heart sounds.  No murmur heard. Pulmonary/Chest: Effort normal and breath sounds normal. No respiratory distress. She has no wheezes.  Musculoskeletal: She exhibits no edema.  Neurological: She  is alert and oriented to person, place, and time.  Psychiatric: She has a normal mood and affect. Her behavior is normal. Judgment and thought content normal.          Assessment & Plan:  HTN- bp a bit on the low side.  Asymptomatic. Was a bit low when she saw cardiology last.  Continue current meds- monitor.  DM2- clinically stable. Obtain a1c, continue metformin/onglyza.    Abnormal Pap- will undergo colposcopy with GYN.

## 2018-08-10 NOTE — Patient Instructions (Signed)
Please complete lab work prior to leaving.   

## 2018-08-11 LAB — HEMOGLOBIN A1C: Hgb A1c MFr Bld: 6.8 % — ABNORMAL HIGH (ref 4.6–6.5)

## 2018-08-11 LAB — BASIC METABOLIC PANEL
BUN: 12 mg/dL (ref 6–23)
CO2: 24 mEq/L (ref 19–32)
Calcium: 10 mg/dL (ref 8.4–10.5)
Chloride: 104 mEq/L (ref 96–112)
Creatinine, Ser: 0.93 mg/dL (ref 0.40–1.20)
GFR: 66.06 mL/min (ref >60.00)
Glucose, Bld: 117 mg/dL — ABNORMAL HIGH (ref 70–99)
Potassium: 3.9 mEq/L (ref 3.5–5.1)
Sodium: 140 mEq/L (ref 135–145)

## 2018-08-13 ENCOUNTER — Other Ambulatory Visit: Payer: Self-pay | Admitting: Family

## 2018-08-21 ENCOUNTER — Other Ambulatory Visit: Payer: Self-pay | Admitting: Family

## 2018-08-26 ENCOUNTER — Other Ambulatory Visit: Payer: Self-pay | Admitting: Family

## 2018-09-08 DIAGNOSIS — N871 Moderate cervical dysplasia: Secondary | ICD-10-CM | POA: Diagnosis not present

## 2018-11-16 ENCOUNTER — Encounter: Payer: Self-pay | Admitting: Family

## 2018-11-16 ENCOUNTER — Ambulatory Visit: Payer: 59 | Admitting: Family

## 2018-11-16 VITALS — BP 115/72 | HR 75 | Temp 98.8°F | Resp 16 | Ht 67.0 in | Wt 186.0 lb

## 2018-11-16 DIAGNOSIS — E119 Type 2 diabetes mellitus without complications: Secondary | ICD-10-CM | POA: Diagnosis not present

## 2018-11-16 DIAGNOSIS — F439 Reaction to severe stress, unspecified: Secondary | ICD-10-CM | POA: Diagnosis not present

## 2018-11-16 DIAGNOSIS — K219 Gastro-esophageal reflux disease without esophagitis: Secondary | ICD-10-CM

## 2018-11-16 DIAGNOSIS — E785 Hyperlipidemia, unspecified: Secondary | ICD-10-CM | POA: Diagnosis not present

## 2018-11-16 NOTE — Progress Notes (Signed)
Subjective:    Patient ID: Katrina Kelly, female    DOB: 12/05/1961, 57 y.o.   MRN: [id number]  HPI  Patient is a 57 yr old female who presents today for follow up.  DM2-  Not taking onglyza due to cost. Continues metformin.  She has been working very hard on her diet and has lost 17 pounds since her last visit.   Wt Readings from Last 3 Encounters:  11/16/18 186 lb (84.4 kg)  08/10/18 203 lb 3.2 oz (92.2 kg)  04/10/18 201 lb (91.2 kg)    Lab Results  Component Value Date   HGBA1C 6.8 (H) 08/10/2018   HGBA1C 6.2 04/10/2018   HGBA1C 6.4 01/09/2018   Lab Results  Component Value Date   MICROALBUR 0.7 08/25/2017   LDLCALC 51 04/10/2018   CREATININE 0.93 08/10/2018   HTN-  On lisinopril and imdur.   BP Readings from Last 3 Encounters:  11/16/18 115/72  08/10/18 104/66  04/10/18 98/61   Hyperlipidemia- maintained on lipitor.   Lab Results  Component Value Date   CHOL 109 04/10/2018   HDL 31.90 (L) 04/10/2018   LDLCALC 51 04/10/2018   TRIG 133.0 04/10/2018   CHOLHDL 3 04/10/2018   GERD- stable on PPI.   Situational stress-she denies current concerns about depression.  She reports that she had been feeling down after the loss of her mother last visit.  Since that time she has a new granddaughter who is 67 months old and has brought the family a lot of joy.  She is looking forward to moving into a new home.  She has been busy fixing up her old house for sale.  Review of Systems    see HPI  Past Medical History:  Diagnosis Date  . Allergy   . Anxiety   . Basal cell carcinoma    skin   . Cardiomyopathy ([place])    EF 35% per pt  . CHF (congestive heart failure) ([place])   . Diabetes type 2, uncontrolled ([place])   . Heartburn   . Hypertension      Social History   Socioeconomic History  . Marital status: Married    Spouse name: Not on file  . Number of children: Not on file  . Years of education: Not on file  . Highest education level: Not on file    Occupational History  . Not on file  Social Needs  . Financial resource strain: Not on file  . Food insecurity:    Worry: Not on file    Inability: Not on file  . Transportation needs:    Medical: Not on file    Non-medical: Not on file  Tobacco Use  . Smoking status: Former Research scientist (life sciences)  . Smokeless tobacco: Never Used  Substance and Sexual Activity  . Alcohol use: Yes    Alcohol/week: 1.0 standard drinks    Types: 1 Standard drinks or equivalent per week    Comment: twice monthly  . Drug use: No  . Sexual activity: Not on file  Lifestyle  . Physical activity:    Days per week: Not on file    Minutes per session: Not on file  . Stress: Not on file  Relationships  . Social connections:    Talks on phone: Not on file    Gets together: Not on file    Attends religious service: Not on file    Active member of club or organization: Not on file    Attends meetings of  clubs or organizations: Not on file    Relationship status: Not on file  . Intimate partner violence:    Fear of current or ex partner: Not on file    Emotionally abused: Not on file    Physically abused: Not on file    Forced sexual activity: Not on file  Other Topics Concern  . 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   Daughter/fiance and grandson live downstairs   Enjoys beach, decorating/yard work.   One dog    Past Surgical History:  Procedure Laterality Date  . CHOLECYSTECTOMY  2008  . COLONOSCOPY WITH PROPOFOL N/A 01/03/2017   Procedure: COLONOSCOPY WITH PROPOFOL;  Surgeon: Mauri Pole, MD;  Location: [facility];  Service: Endoscopy;  Laterality: N/A;  . FOOT SURGERY  1982   "benign tumor bottom of right foot"  . NASAL SEPTUM SURGERY  2000  . TONSILLECTOMY  2000  . uterine ablation    . UVULECTOMY  2000    Family History  Problem Relation Age of Onset  . Diabetes Mother   . Hypertension Mother   .  Cancer Father        lung  . Emphysema Father   . Diabetes Sister   . Diabetes Brother   . Leukemia Brother   . Stroke Brother   . Heart attack Maternal Uncle   . Breast cancer Paternal Aunt 29  . Lymphoma Paternal Uncle   . Polycystic ovary syndrome Daughter   . Hypertension Daughter   . Diabetes Mellitus II Maternal Grandmother   . Colon cancer Neg Hx     Allergies  Allergen Reactions  . Lactose Intolerance (Gi)     GI symptoms    Current Outpatient Medications on File Prior to Visit  Medication Sig Dispense Refill  . atorvastatin (LIPITOR) 10 MG tablet TAKE 1 TABLET BY MOUTH DAILY 30 tablet 5  . Blood Glucose Monitoring Suppl (ONETOUCH VERIO) w/Device KIT USE AS DIRECTED 1 kit 0  . carvedilol (COREG) 3.125 MG tablet Take 1 tablet (3.125 mg total) by mouth 2 (two) times daily with a meal. 60 tablet 3  . cetirizine (ZYRTEC) 10 MG tablet Take 10 mg by mouth daily.    . fluticasone (FLONASE) 50 MCG/ACT nasal spray Place 2 sprays into both nostrils daily. (Patient taking differently: Place 2 sprays into both nostrils daily as needed for allergies. ) 16 g 1  . furosemide (LASIX) 40 MG tablet Take 40 mg by mouth daily as needed for edema.     . isosorbide mononitrate (IMDUR) 30 MG 24 hr tablet Take 30 mg by mouth at bedtime.     Marland Kitchen lisinopril (PRINIVIL,ZESTRIL) 5 MG tablet Take 5 mg by mouth daily.    . metFORMIN (GLUCOPHAGE) 500 MG tablet TAKE 1 TABLET(500 MG) BY MOUTH TWICE DAILY WITH A MEAL 60 tablet 3  . ONETOUCH DELICA LANCETS 67M MISC USE TO CHECK BLOOD SUGAR 1-2 TIMES DAILY. DX E11.65 100 each 2  . ONETOUCH VERIO test strip CHECK SUGAR TWICE DAILY 200 each 1  . pantoprazole (PROTONIX) 40 MG tablet TAKE 1 TABLET(40 MG) BY MOUTH DAILY 30 tablet 5  . potassium chloride (K-DUR,KLOR-CON) 10 MEQ tablet Take 10 mEq by mouth daily as needed (swelling).     . nitroGLYCERIN (NITROSTAT) 0.4 MG SL tablet Place 0.4 mg under the tongue every 5 (five) minutes as needed for chest pain.  No current facility-administered medications on file prior to visit.     BP 115/72 (BP Location: Right Arm, Patient Position: Sitting, Cuff Size: Small)   Pulse 75   Temp 98.8 F (37.1 C) (Oral)   Resp 16   Ht '5\' 7"'$  (1.702 m)   Wt 186 lb (84.4 kg)   SpO2 99%   BMI 29.13 kg/m    Objective:   Physical Exam  Constitutional: She is oriented to person, place, and time. She appears well-developed and well-nourished.  Cardiovascular: Normal rate, regular rhythm and normal heart sounds.  No murmur heard. Pulmonary/Chest: Effort normal and breath sounds normal. No respiratory distress. She has no wheezes.  Musculoskeletal: She exhibits no edema.  Neurological: She is alert and oriented to person, place, and time.  Psychiatric: She has a normal mood and affect. Her behavior is normal. Judgment and thought content normal.          Assessment & Plan:  Diabetes type 2-expect improvement in her numbers given her recent weight loss and dietary changes.  Will obtain follow-up A1c.  Hyperlipidemia-continue statin will obtain follow-up lipid panel  Hypertension-blood pressure stable on current medications.  Continue same.  GERD-reports stable on current medication.  Continue same.

## 2018-11-16 NOTE — Patient Instructions (Signed)
Please complete lab work prior to leaving.   

## 2018-11-17 ENCOUNTER — Other Ambulatory Visit: Payer: Self-pay | Admitting: Family

## 2018-11-17 ENCOUNTER — Encounter: Payer: Self-pay | Admitting: Family

## 2018-11-17 LAB — COMPREHENSIVE METABOLIC PANEL
ALT: 23 U/L (ref 0–35)
AST: 21 U/L (ref 0–37)
Albumin: 4.4 g/dL (ref 3.5–5.2)
Alkaline Phosphatase: 62 U/L (ref 39–117)
BUN: 12 mg/dL (ref 6–23)
CO2: 26 mEq/L (ref 19–32)
Calcium: 9.6 mg/dL (ref 8.4–10.5)
Chloride: 106 mEq/L (ref 96–112)
Creatinine, Ser: 0.86 mg/dL (ref 0.40–1.20)
GFR: 72.23 mL/min (ref >60.00)
Glucose, Bld: 98 mg/dL (ref 70–99)
Potassium: 3.7 mEq/L (ref 3.5–5.1)
Sodium: 141 mEq/L (ref 135–145)
Total Bilirubin: 0.9 mg/dL (ref 0.2–1.2)
Total Protein: 7.2 g/dL (ref 6.0–8.3)

## 2018-11-17 LAB — HEMOGLOBIN A1C: Hgb A1c MFr Bld: 5.8 % (ref 4.6–6.5)

## 2019-01-06 ENCOUNTER — Other Ambulatory Visit: Payer: Self-pay | Admitting: Family

## 2019-02-15 DIAGNOSIS — C44712 Basal cell carcinoma of skin of right lower limb, including hip: Secondary | ICD-10-CM | POA: Diagnosis not present

## 2019-02-15 DIAGNOSIS — L570 Actinic keratosis: Secondary | ICD-10-CM | POA: Diagnosis not present

## 2019-02-15 DIAGNOSIS — D485 Neoplasm of uncertain behavior of skin: Secondary | ICD-10-CM | POA: Diagnosis not present

## 2019-02-15 DIAGNOSIS — Z808 Family history of malignant neoplasm of other organs or systems: Secondary | ICD-10-CM | POA: Diagnosis not present

## 2019-03-03 ENCOUNTER — Other Ambulatory Visit: Payer: Self-pay

## 2019-03-03 MED ORDER — PANTOPRAZOLE SODIUM 40 MG PO TBEC: DELAYED_RELEASE_TABLET | ORAL | 1 refills | Status: DC

## 2019-03-22 ENCOUNTER — Ambulatory Visit: Payer: 59 | Admitting: Family

## 2019-03-23 ENCOUNTER — Ambulatory Visit: Payer: 59 | Admitting: Family

## 2019-03-31 ENCOUNTER — Encounter: Payer: Self-pay | Admitting: Family

## 2019-03-31 NOTE — Telephone Encounter (Signed)
Spoke with pt and advised her we are available to do Virtual Visits and labs can be discussed at that time to determine necessity of lab appointment. Pt states she prefers to wait at least 1 more month and would prefer an in office visit. If office is not available for in office visits at that time pt states she would be willing to schedule a Virtual visit at that time.

## 2019-04-29 DIAGNOSIS — L820 Inflamed seborrheic keratosis: Secondary | ICD-10-CM | POA: Diagnosis not present

## 2019-04-29 DIAGNOSIS — C44712 Basal cell carcinoma of skin of right lower limb, including hip: Secondary | ICD-10-CM | POA: Diagnosis not present

## 2019-04-29 DIAGNOSIS — L570 Actinic keratosis: Secondary | ICD-10-CM | POA: Diagnosis not present

## 2019-06-15 LAB — HM PAP SMEAR: HM Pap smear: HIGH

## 2019-06-21 ENCOUNTER — Other Ambulatory Visit: Payer: Self-pay | Admitting: Family

## 2019-06-22 ENCOUNTER — Encounter: Payer: Self-pay | Admitting: Family

## 2019-06-29 ENCOUNTER — Encounter: Payer: Self-pay | Admitting: Family

## 2019-06-29 ENCOUNTER — Ambulatory Visit (INDEPENDENT_AMBULATORY_CARE_PROVIDER_SITE_OTHER): Payer: BC Managed Care – PPO | Admitting: Family

## 2019-06-29 ENCOUNTER — Other Ambulatory Visit: Payer: Self-pay

## 2019-06-29 VITALS — BP 94/66 | HR 85 | Temp 98.6°F | Resp 16 | Ht 67.0 in | Wt 191.0 lb

## 2019-06-29 DIAGNOSIS — Z9109 Other allergy status, other than to drugs and biological substances: Secondary | ICD-10-CM | POA: Diagnosis not present

## 2019-06-29 DIAGNOSIS — I428 Other cardiomyopathies: Secondary | ICD-10-CM | POA: Diagnosis not present

## 2019-06-29 DIAGNOSIS — E119 Type 2 diabetes mellitus without complications: Secondary | ICD-10-CM

## 2019-06-29 MED ORDER — PANTOPRAZOLE SODIUM 40 MG PO TBEC: 40.0000 mg | DELAYED_RELEASE_TABLET | Freq: Every day | ORAL | 1 refills | Status: DC | PRN

## 2019-06-29 NOTE — Progress Notes (Signed)
Subjective:    Patient ID: Katrina Kelly, female    DOB: Feb 19, 1961, 58 y.o.   MRN: [id number]  HPI  Patient is a 58 yr old female who presents today for follow up.  DM2- not checking sugars recently.  Denies hypoglycemia. Reports some exercise. She is walking.  Lab Results  Component Value Date   HGBA1C 5.8 11/16/2018   HGBA1C 6.8 (H) 08/10/2018   HGBA1C 6.2 04/10/2018   Lab Results  Component Value Date   MICROALBUR 0.7 08/25/2017   LDLCALC 51 04/10/2018   CREATININE 0.86 11/16/2018   NICM- denies feeling light headed or dizzy.  BP Readings from Last 3 Encounters:  06/29/19 94/66  11/16/18 115/72  08/10/18 104/66   GERD- not using protonix regularly. Not needing secondary to dietary changes.   Reports sneezing, nasal congestion- taking zyrtec. Not using nasal spray.    Wt Readings from Last 3 Encounters:  06/29/19 191 lb (86.6 kg)  11/16/18 186 lb (84.4 kg)  08/10/18 203 lb 3.2 oz (92.2 kg)    Review of Systems    see HPI  Past Medical History:  Diagnosis Date  . Allergy   . Anxiety   . Basal cell carcinoma    skin   . Cardiomyopathy ([place])    EF 35% per pt  . CHF (congestive heart failure) ([place])   . Diabetes type 2, uncontrolled ([place])   . Heartburn   . Hypertension      Social History   Socioeconomic History  . Marital status: Married    Spouse name: Not on file  . Number of children: Not on file  . Years of education: Not on file  . Highest education level: Not on file  Occupational History  . Not on file  Social Needs  . Financial resource strain: Not on file  . Food insecurity    Worry: Not on file    Inability: Not on file  . Transportation needs    Medical: Not on file    Non-medical: Not on file  Tobacco Use  . Smoking status: Former Research scientist (life sciences)  . Smokeless tobacco: Never Used  Substance and Sexual Activity  . Alcohol use: Yes    Alcohol/week: 1.0 standard drinks    Types: 1 Standard drinks or equivalent per week    Comment: twice  monthly  . Drug use: No  . Sexual activity: Not on file  Lifestyle  . Physical activity    Days per week: Not on file    Minutes per session: Not on file  . Stress: Not on file  Relationships  . Social Herbalist on phone: Not on file    Gets together: Not on file    Attends religious service: Not on file    Active member of club or organization: Not on file    Attends meetings of clubs or organizations: Not on file    Relationship status: Not on file  . Intimate partner violence    Fear of current or ex partner: Not on file    Emotionally abused: Not on file    Physically abused: Not on file    Forced sexual activity: Not on file  Other Topics Concern  . 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   Daughter/fiance and grandson live downstairs   Enjoys beach, decorating/yard work.   One dog    Past Surgical History:  Procedure Laterality Date  . CHOLECYSTECTOMY  2008  . COLONOSCOPY WITH PROPOFOL N/A 01/03/2017   Procedure: COLONOSCOPY WITH PROPOFOL;  Surgeon: Mauri Pole, MD;  Location: [facility];  Service: Endoscopy;  Laterality: N/A;  . FOOT SURGERY  1982   "benign tumor bottom of right foot"  . NASAL SEPTUM SURGERY  2000  . TONSILLECTOMY  2000  . uterine ablation    . UVULECTOMY  2000    Family History  Problem Relation Age of Onset  . Diabetes Mother   . Hypertension Mother   . Cancer Father        lung  . Emphysema Father   . Diabetes Sister   . Diabetes Brother   . Leukemia Brother   . Stroke Brother   . Heart attack Maternal Uncle   . Breast cancer Paternal Aunt 70  . Lymphoma Paternal Uncle   . Polycystic ovary syndrome Daughter   . Hypertension Daughter   . Diabetes Mellitus II Maternal Grandmother   . Colon cancer Neg Hx     Allergies  Allergen Reactions  . Lactose Intolerance (Gi)     GI symptoms    Current Outpatient Medications on File  Prior to Visit  Medication Sig Dispense Refill  . atorvastatin (LIPITOR) 10 MG tablet TAKE 1 TABLET BY MOUTH DAILY 30 tablet 5  . Blood Glucose Monitoring Suppl (ONETOUCH VERIO) w/Device KIT USE AS DIRECTED 1 kit 0  . carvedilol (COREG) 3.125 MG tablet Take 1 tablet (3.125 mg total) by mouth 2 (two) times daily with a meal. 60 tablet 3  . cetirizine (ZYRTEC) 10 MG tablet Take 10 mg by mouth daily.    . furosemide (LASIX) 40 MG tablet Take 40 mg by mouth daily as needed for edema.     . isosorbide mononitrate (IMDUR) 30 MG 24 hr tablet Take 30 mg by mouth at bedtime.     Marland Kitchen lisinopril (PRINIVIL,ZESTRIL) 5 MG tablet Take 5 mg by mouth daily.    Glory Rosebush DELICA LANCETS 56C MISC USE TO CHECK BLOOD SUGAR 1-2 TIMES DAILY. DX E11.65 100 each 2  . ONETOUCH VERIO test strip CHECK SUGAR TWICE DAILY 200 each 1  . potassium chloride (K-DUR,KLOR-CON) 10 MEQ tablet Take 10 mEq by mouth daily as needed (swelling).     . nitroGLYCERIN (NITROSTAT) 0.4 MG SL tablet Place 0.4 mg under the tongue every 5 (five) minutes as needed for chest pain.      No current facility-administered medications on file prior to visit.     BP 94/66 (BP Location: Right Arm, Patient Position: Sitting, Cuff Size: Large)   Pulse 85   Temp 98.6 F (37 C) (Oral)   Resp 16   Ht '5\' 7"'$  (1.702 m)   Wt 191 lb (86.6 kg)   SpO2 99%   BMI 29.91 kg/m    Objective:   Physical Exam Constitutional:      Appearance: She is well-developed.  Neck:     Musculoskeletal: Neck supple.     Thyroid: No thyromegaly.  Cardiovascular:     Rate and Rhythm: Normal rate and regular rhythm.     Heart sounds: Normal heart sounds. No murmur.  Pulmonary:     Effort: Pulmonary effort is normal. No respiratory distress.     Breath sounds: Normal breath sounds. No wheezing.  Skin:    General: Skin is warm and dry.  Neurological:     Mental Status: She is alert and oriented to person, place, and time.  Psychiatric:        Behavior: Behavior  normal.        Thought Content: Thought content normal.        Judgment: Judgment normal.           Assessment & Plan:  NICM- BP a bit soft, but asymptomatic. Continue current meds. She is advised to let us know if she develops dizziness/lightheadedness. Appears euvolemic.   DM2-clinically stable. Obtain follow up A1C.  Allergic rhinitis/enviromental allergies- advised pt to add back in flonase as needed. Continue zyrtec.

## 2019-06-30 LAB — HEMOGLOBIN A1C: Hgb A1c MFr Bld: 6.2 % (ref 4.6–6.5)

## 2019-06-30 LAB — COMPREHENSIVE METABOLIC PANEL
ALT: 19 U/L (ref 0–35)
AST: 17 U/L (ref 0–37)
Albumin: 4.2 g/dL (ref 3.5–5.2)
Alkaline Phosphatase: 87 U/L (ref 39–117)
BUN: 13 mg/dL (ref 6–23)
CO2: 24 mEq/L (ref 19–32)
Calcium: 9 mg/dL (ref 8.4–10.5)
Chloride: 104 mEq/L (ref 96–112)
Creatinine, Ser: 0.75 mg/dL (ref 0.40–1.20)
GFR: 79.42 mL/min (ref >60.00)
Glucose, Bld: 124 mg/dL — ABNORMAL HIGH (ref 70–99)
Potassium: 4.1 mEq/L (ref 3.5–5.1)
Sodium: 139 mEq/L (ref 135–145)
Total Bilirubin: 0.8 mg/dL (ref 0.2–1.2)
Total Protein: 7 g/dL (ref 6.0–8.3)

## 2019-06-30 LAB — MICROALBUMIN / CREATININE URINE RATIO
Creatinine,U: 53 mg/dL
Microalb Creat Ratio: 1.3 mg/g (ref 0.0–30.0)
Microalb, Ur: <0.7 mg/dL (ref 0.0–1.9)

## 2019-07-02 ENCOUNTER — Encounter: Payer: Self-pay | Admitting: Family

## 2019-07-11 ENCOUNTER — Other Ambulatory Visit: Payer: Self-pay | Admitting: Family

## 2019-07-20 ENCOUNTER — Other Ambulatory Visit: Payer: Self-pay

## 2019-07-20 ENCOUNTER — Ambulatory Visit (INDEPENDENT_AMBULATORY_CARE_PROVIDER_SITE_OTHER): Payer: BC Managed Care – PPO | Admitting: Family Medicine

## 2019-07-20 ENCOUNTER — Encounter: Payer: Self-pay | Admitting: Family Medicine

## 2019-07-20 DIAGNOSIS — Z20828 Contact with and (suspected) exposure to other viral communicable diseases: Secondary | ICD-10-CM | POA: Diagnosis not present

## 2019-07-20 DIAGNOSIS — J0100 Acute maxillary sinusitis, unspecified: Secondary | ICD-10-CM

## 2019-07-20 DIAGNOSIS — Z20822 Contact with and (suspected) exposure to covid-19: Secondary | ICD-10-CM

## 2019-07-20 NOTE — Progress Notes (Signed)
Chief Complaint  Patient presents with  . Generalized Body Aches    Patient better today mostly congested in her face  . Fever  . Sinusitis    Katrina Kelly here for URI complaints.  Duration: 2 days  Associated symptoms: subjective fever, cough, sinus congestion, sinus pain, rhinorrhea and myalgia Denies: itchy watery eyes, ear pain, ear drainage, sore throat, wheezing and shortness of breath, no digestive s/s's Treatment to date: Zyrtec bid Sick contacts: Yes - 2 people at work tested positive for COVID-19  ROS:  Const: Denies current fevers HEENT: As noted in HPI Lungs: No SOB  Past Medical History:  Diagnosis Date  . Allergy   . Anxiety   . Basal cell carcinoma    skin   . Cardiomyopathy ([place])    EF 35% per pt  . CHF (congestive heart failure) ([place])   . Diabetes type 2, uncontrolled ([place])   . Heartburn   . Hypertension    Exam No conversational dyspnea Age appropriate judgment and insight Nml affect and mood  Acute maxillary sinusitis, recurrence not specified - Plan: Novel Coronavirus, NAA ([facility])  Exposure to Covid-19 Virus - Plan: Novel Coronavirus, NAA ([facility])  I think she is fine to cont supportive care, likely allergic in etiology.  Continue to push fluids, practice good hand hygiene, social distance, wear mask when appropriate. Total time spent: 11 min F/u prn.  Pt voiced understanding and agreement to the plan.  [place], DO 07/20/19 4:33 PM

## 2019-07-21 ENCOUNTER — Other Ambulatory Visit: Payer: Self-pay

## 2019-07-21 DIAGNOSIS — Z20822 Contact with and (suspected) exposure to covid-19: Secondary | ICD-10-CM

## 2019-07-23 LAB — NOVEL CORONAVIRUS, NAA: SARS-CoV-2, NAA: DETECTED — AB

## 2019-07-30 ENCOUNTER — Other Ambulatory Visit: Payer: Self-pay

## 2019-07-30 ENCOUNTER — Ambulatory Visit (INDEPENDENT_AMBULATORY_CARE_PROVIDER_SITE_OTHER): Payer: BC Managed Care – PPO | Admitting: Family

## 2019-07-30 DIAGNOSIS — U071 COVID-19: Secondary | ICD-10-CM

## 2019-07-30 NOTE — Progress Notes (Signed)
Virtual Visit via Telephone Note  I connected with Katrina Kelly on 07/30/19 at  3:40 PM EDT by telephone and verified that I am speaking with the correct person using two identifiers.  Location: Patient: home Provider: office    I discussed the limitations, risks, security and privacy concerns of performing an evaluation and management service by telephone and the availability of in person appointments. I also discussed with the patient that there may be a patient responsible charge related to this service. The patient expressed understanding and agreed to proceed.   History of Present Illness:  Patient is a 58 yr old female who presents today to discuss return to work clearance following COVID-19 exposure.    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 Got sick on 8/3. Got home on 8/4 and got tested and covid-19 testing was +. Reports that she had chills/fever x 1 day. Then had fatigue and rash on her back. Sinus congestion. Did have diarrhea which resolved with immodium. She denies any significant cough.  Never had sob. She did have a HA for a few days.  Did not lose sense of taste/smell.  Reports that she is still has sinus congestion in the AM but then she is fine the rest of the day.   Past Medical History:  Diagnosis Date  . Allergy   . Anxiety   . Basal cell carcinoma    skin   . Cardiomyopathy ([facility])    EF 35% per pt  . CHF (congestive heart failure) ([facility])   . Diabetes type 2, uncontrolled ([facility])   . Heartburn   . Hypertension      Social History   Socioeconomic History  . Marital status: Married    Spouse name: Not on file  . Number of children: Not on file  . Years of education: Not on file  . Highest education level: Not on file  Occupational History  . Not on file  Social Needs  . Financial resource strain: Not on file  . Food insecurity    Worry: Not on file    Inability: Not on file  . Transportation needs    Medical: Not on file    Non-medical: Not on  file  Tobacco Use  . Smoking status: Former Smoker  . Smokeless tobacco: Never Used  Substance and Sexual Activity  . Alcohol use: Yes    Alcohol/week: 1.0 standard drinks    Types: 1 Standard drinks or equivalent per week    Comment: twice monthly  . Drug use: No  . Sexual activity: Not on file  Lifestyle  . Physical activity    Days per week: Not on file    Minutes per session: Not on file  . Stress: Not on file  Relationships  . Social connections    Talks on phone: Not on file    Gets together: Not on file    Attends religious service: Not on file    Active member of club or organization: Not on file    Attends meetings of clubs or organizations: Not on file    Relationship status: Not on file  . Intimate partner violence    Fear of current or ex partner: Not on file    Emotionally abused: Not on file    Physically abused: Not on file    Forced sexual activity: Not on file  Other Topics Concern  . 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   Daughter/fiance and grandson live downstairs   Enjoys beach, decorating/yard work.   One dog    Past Surgical History:  Procedure Laterality Date  . CHOLECYSTECTOMY  2008  . COLONOSCOPY WITH PROPOFOL N/A 01/03/2017   Procedure: COLONOSCOPY WITH PROPOFOL;  Surgeon: Kavitha Nandigam V, MD;  Location: [facility];  Service: Endoscopy;  Laterality: N/A;  . FOOT SURGERY  1982   "benign tumor bottom of right foot"  . NASAL SEPTUM SURGERY  2000  . TONSILLECTOMY  2000  . uterine ablation    . UVULECTOMY  2000    Family History  Problem Relation Age of Onset  . Diabetes Mother   . Hypertension Mother   . Cancer Father        lung  . Emphysema Father   . Diabetes Sister   . Diabetes Brother   . Leukemia Brother   . Stroke Brother   . Heart attack Maternal Uncle   . Breast cancer Paternal Aunt 40  . Lymphoma Paternal Uncle   . Polycystic ovary  syndrome Daughter   . Hypertension Daughter   . Diabetes Mellitus II Maternal Grandmother   . Colon cancer Neg Hx     Allergies  Allergen Reactions  . Lactose Intolerance (Gi)     GI symptoms    Current Outpatient Medications on File Prior to Visit  Medication Sig Dispense Refill  . atorvastatin (LIPITOR) 10 MG tablet TAKE 1 TABLET BY MOUTH DAILY 30 tablet 5  . Blood Glucose Monitoring Suppl (ONETOUCH VERIO) w/Device KIT USE AS DIRECTED 1 kit 0  . carvedilol (COREG) 3.125 MG tablet Take 1 tablet (3.125 mg total) by mouth 2 (two) times daily with a meal. 60 tablet 3  . cetirizine (ZYRTEC) 10 MG tablet Take 10 mg by mouth daily.    . furosemide (LASIX) 40 MG tablet Take 40 mg by mouth daily as needed for edema.     . isosorbide mononitrate (IMDUR) 30 MG 24 hr tablet Take 30 mg by mouth at bedtime.     . lisinopril (PRINIVIL,ZESTRIL) 5 MG tablet Take 5 mg by mouth daily.    . nitroGLYCERIN (NITROSTAT) 0.4 MG SL tablet Place 0.4 mg under the tongue every 5 (five) minutes as needed for chest pain.     . ONETOUCH DELICA LANCETS 33G MISC USE TO CHECK BLOOD SUGAR 1-2 TIMES DAILY. DX E11.65 100 each 2  . ONETOUCH VERIO test strip CHECK SUGAR TWICE DAILY 200 each 1  . pantoprazole (PROTONIX) 40 MG tablet Take 1 tablet (40 mg total) by mouth daily as needed. TAKE 1 TABLET(40 MG) BY MOUTH DAILY 90 tablet 1  . potassium chloride (K-DUR,KLOR-CON) 10 MEQ tablet Take 10 mEq by mouth daily as needed (swelling).      No current facility-administered medications on file prior to visit.     There were no vitals taken for this visit.    Observations/Objective:   Gen: Awake, alert, no acute distress Resp: Breathing is even and non-labored Psych: calm/pleasant demeanor Neuro: Alert and Oriented x 3, speech sounds clear.  Assessment and Plan:  COVID-19 infection- clinically resolved. Advised pt ok to return to work on Monday if she remains free of Covid-19 symptoms over the weekend.  Letter  written for her provider.  Pt verbalizes understanding.   Follow Up Instructions:    I discussed the assessment and treatment plan with the patient. The patient was provided an opportunity to ask questions and all   were answered. The patient agreed with the plan and demonstrated an understanding of the instructions.   The patient was advised to call back or seek an in-person evaluation if the symptoms worsen or if the condition fails to improve as anticipated.  I provided 8 minutes of non-face-to-face time during this encounter.   Melissa S O'Sullivan, NP   

## 2019-08-17 DIAGNOSIS — C44712 Basal cell carcinoma of skin of right lower limb, including hip: Secondary | ICD-10-CM | POA: Diagnosis not present

## 2019-08-17 DIAGNOSIS — L821 Other seborrheic keratosis: Secondary | ICD-10-CM | POA: Diagnosis not present

## 2019-09-08 ENCOUNTER — Ambulatory Visit (INDEPENDENT_AMBULATORY_CARE_PROVIDER_SITE_OTHER): Payer: BC Managed Care – PPO | Admitting: Family

## 2019-09-08 ENCOUNTER — Other Ambulatory Visit: Payer: Self-pay

## 2019-09-08 DIAGNOSIS — J0100 Acute maxillary sinusitis, unspecified: Secondary | ICD-10-CM | POA: Diagnosis not present

## 2019-09-08 MED ORDER — AMOXICILLIN-POT CLAVULANATE 875-125 MG PO TABS: 1.0000 | ORAL_TABLET | Freq: Two times a day (BID) | ORAL | 0 refills | Status: DC

## 2019-09-08 NOTE — Progress Notes (Signed)
Virtual Visit via Telephone Note  I connected with Katrina Kelly on 09/08/19 at 10:40 AM EDT by telephone and verified that I am speaking with the correct person using two identifiers.  Location: Patient: home Provider: work   I discussed the limitations, risks, security and privacy concerns of performing an evaluation and management service by telephone and the availability of in person appointments. I also discussed with the patient that there may be a patient responsible charge related to this service. The patient expressed understanding and agreed to proceed.   History of Present Illness:  Pt reports that the left side of her face is swollen.  Reports that she has had some sores in her nose.  Pain in the teeth and overlying the left jaw. Reports that she has had some mild dizziness. Feels like her left ear sounds "muffled."  Denies associated fever.  + fatigue.  She reports + nasal drainage bloody on one side. Symptoms began in early august. She did have covid-19 back in August.  Observations/Objective:   Gen: Awake, alert, no acute distress Resp: Breathing sounds even and non-labored Psych: calm/pleasant demeanor Neuro: Alert and Oriented x 3,  speech sounds clear.   Assessment and Plan:  Sinusitis- will rx with augmentin. Pt is advised to call if symptoms worsen or if symptoms fail to improve.   Follow Up Instructions:    I discussed the assessment and treatment plan with the patient. The patient was provided an opportunity to ask questions and all were answered. The patient agreed with the plan and demonstrated an understanding of the instructions.   The patient was advised to call back or seek an in-person evaluation if the symptoms worsen or if the condition fails to improve as anticipated.  I provided 6 minutes of non-face-to-face time during this encounter.   Nance Pear, NP

## 2019-09-10 DIAGNOSIS — U071 COVID-19: Secondary | ICD-10-CM | POA: Diagnosis not present

## 2019-09-10 DIAGNOSIS — I428 Other cardiomyopathies: Secondary | ICD-10-CM | POA: Diagnosis not present

## 2019-09-10 DIAGNOSIS — R0602 Shortness of breath: Secondary | ICD-10-CM | POA: Diagnosis not present

## 2019-10-01 ENCOUNTER — Ambulatory Visit: Payer: BC Managed Care – PPO | Admitting: Family

## 2019-10-27 ENCOUNTER — Ambulatory Visit (INDEPENDENT_AMBULATORY_CARE_PROVIDER_SITE_OTHER): Payer: BC Managed Care – PPO | Admitting: Family

## 2019-10-27 ENCOUNTER — Other Ambulatory Visit: Payer: Self-pay

## 2019-10-27 ENCOUNTER — Encounter: Payer: Self-pay | Admitting: Family

## 2019-10-27 DIAGNOSIS — L03032 Cellulitis of left toe: Secondary | ICD-10-CM

## 2019-10-27 MED ORDER — CEPHALEXIN 500 MG PO CAPS: 500.0000 mg | ORAL_CAPSULE | Freq: Three times a day (TID) | ORAL | 0 refills | Status: DC

## 2019-10-27 NOTE — Progress Notes (Signed)
Virtual Visit via Video Note  I connected with Katrina Kelly on 10/27/19 at  8:40 AM EST by a video enabled telemedicine application and verified that I am speaking with the correct person using two identifiers.  Location: Patient: car Provider: home   I discussed the limitations of evaluation and management by telemedicine and the availability of in person appointments. The patient expressed understanding and agreed to proceed.  History of Present Illness:  Patient reports that she had an ingrown toenail on the third toe on the left foot. Reports that is started to hurt about 3-4 days ago.  Reports that "I cut out the ingrown toenail."  Reports that she has been applying peroxide and antibiotic ointment but it is still red and sore.  Past Medical History:  Diagnosis Date  . Allergy   . Anxiety   . Basal cell carcinoma    skin   . Cardiomyopathy ([place])    EF 35% per pt  . CHF (congestive heart failure) ([place])   . Diabetes type 2, uncontrolled ([place])   . Heartburn   . Hypertension      Social History   Socioeconomic History  . Marital status: Married    Spouse name: Not on file  . Number of children: Not on file  . Years of education: Not on file  . Highest education level: Not on file  Occupational History  . Not on file  Social Needs  . Financial resource strain: Not on file  . Food insecurity    Worry: Not on file    Inability: Not on file  . Transportation needs    Medical: Not on file    Non-medical: Not on file  Tobacco Use  . Smoking status: Former Research scientist (life sciences)  . Smokeless tobacco: Never Used  Substance and Sexual Activity  . Alcohol use: Yes    Alcohol/week: 1.0 standard drinks    Types: 1 Standard drinks or equivalent per week    Comment: twice monthly  . Drug use: No  . Sexual activity: Not on file  Lifestyle  . Physical activity    Days per week: Not on file    Minutes per session: Not on file  . Stress: Not on file  Relationships  . Social Product manager on phone: Not on file    Gets together: Not on file    Attends religious service: Not on file    Active member of club or organization: Not on file    Attends meetings of clubs or organizations: Not on file    Relationship status: Not on file  . Intimate partner violence    Fear of current or ex partner: Not on file    Emotionally abused: Not on file    Physically abused: Not on file    Forced sexual activity: Not on file  Other Topics Concern  . 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   Daughter/fiance and grandson live downstairs   Enjoys beach, decorating/yard work.   One dog    Past Surgical History:  Procedure Laterality Date  . CHOLECYSTECTOMY  2008  . COLONOSCOPY WITH PROPOFOL N/A 01/03/2017   Procedure: COLONOSCOPY WITH PROPOFOL;  Surgeon: Mauri Pole, MD;  Location: [facility];  Service: Endoscopy;  Laterality: N/A;  . FOOT SURGERY  1982   "benign tumor bottom of right foot"  . NASAL SEPTUM SURGERY  2000  .  TONSILLECTOMY  2000  . uterine ablation    . UVULECTOMY  2000    Family History  Problem Relation Age of Onset  . Diabetes Mother   . Hypertension Mother   . Cancer Father        lung  . Emphysema Father   . Diabetes Sister   . Diabetes Brother   . Leukemia Brother   . Stroke Brother   . Heart attack Maternal Uncle   . Breast cancer Paternal Aunt 63  . Lymphoma Paternal Uncle   . Polycystic ovary syndrome Daughter   . Hypertension Daughter   . Diabetes Mellitus II Maternal Grandmother   . Colon cancer Neg Hx     Allergies  Allergen Reactions  . Lactose Intolerance (Gi)     GI symptoms    Current Outpatient Medications on File Prior to Visit  Medication Sig Dispense Refill  . atorvastatin (LIPITOR) 10 MG tablet TAKE 1 TABLET BY MOUTH DAILY 30 tablet 5  . Blood Glucose Monitoring Suppl (ONETOUCH VERIO) w/Device KIT USE AS DIRECTED 1 kit 0  .  carvedilol (COREG) 3.125 MG tablet Take 1 tablet (3.125 mg total) by mouth 2 (two) times daily with a meal. 60 tablet 3  . cetirizine (ZYRTEC) 10 MG tablet Take 10 mg by mouth daily.    . furosemide (LASIX) 40 MG tablet Take 40 mg by mouth daily as needed for edema.     . isosorbide mononitrate (IMDUR) 30 MG 24 hr tablet Take 30 mg by mouth at bedtime.     Marland Kitchen lisinopril (PRINIVIL,ZESTRIL) 5 MG tablet Take 5 mg by mouth daily.    Glory Rosebush DELICA LANCETS 16P MISC USE TO CHECK BLOOD SUGAR 1-2 TIMES DAILY. DX E11.65 100 each 2  . ONETOUCH VERIO test strip CHECK SUGAR TWICE DAILY 200 each 1  . pantoprazole (PROTONIX) 40 MG tablet Take 1 tablet (40 mg total) by mouth daily as needed. TAKE 1 TABLET(40 MG) BY MOUTH DAILY 90 tablet 1  . potassium chloride (K-DUR,KLOR-CON) 10 MEQ tablet Take 10 mEq by mouth daily as needed (swelling).     . nitroGLYCERIN (NITROSTAT) 0.4 MG SL tablet Place 0.4 mg under the tongue every 5 (five) minutes as needed for chest pain.      No current facility-administered medications on file prior to visit.     There were no vitals taken for this visit.      Observations/Objective:   Gen: Awake, alert, no acute distress Resp: Breathing is even and non-labored Psych: calm/pleasant demeanor Neuro: Alert and Oriented x 3, + facial symmetry, speech is clear. Skin:  + erythema and minimal swelling at base of nailbed of left middle toe  Assessment and Plan:  Paronychia-  Advised pt to soak foot bid in warm epsom salts.  Continue to apply antibiotic ointment to the affected area. Begin Keflex. She is advised to call if symptoms worsen or if symptoms fail to improve.  Pt verbalizes understanding.  Follow Up Instructions:    I discussed the assessment and treatment plan with the patient. The patient was provided an opportunity to ask questions and all were answered. The patient agreed with the plan and demonstrated an understanding of the instructions.   The patient was  advised to call back or seek an in-person evaluation if the symptoms worsen or if the condition fails to improve as anticipated.  Nance Pear, NP

## 2019-12-06 ENCOUNTER — Other Ambulatory Visit: Payer: Self-pay

## 2019-12-06 MED ORDER — ATORVASTATIN CALCIUM 10 MG PO TABS: 10.0000 mg | ORAL_TABLET | Freq: Every day | ORAL | 5 refills | Status: DC

## 2019-12-15 DIAGNOSIS — Z130 Encounter for screening for diseases of the blood and blood-forming organs and certain disorders involving the immune mechanism: Secondary | ICD-10-CM | POA: Diagnosis not present

## 2019-12-15 DIAGNOSIS — Z1231 Encounter for screening mammogram for malignant neoplasm of breast: Secondary | ICD-10-CM | POA: Diagnosis not present

## 2019-12-15 DIAGNOSIS — Z6830 Body mass index (BMI) 30.0-30.9, adult: Secondary | ICD-10-CM | POA: Diagnosis not present

## 2019-12-15 DIAGNOSIS — Z1389 Encounter for screening for other disorder: Secondary | ICD-10-CM | POA: Diagnosis not present

## 2019-12-15 DIAGNOSIS — Z8742 Personal history of other diseases of the female genital tract: Secondary | ICD-10-CM | POA: Diagnosis not present

## 2019-12-15 DIAGNOSIS — R319 Hematuria, unspecified: Secondary | ICD-10-CM | POA: Diagnosis not present

## 2019-12-15 DIAGNOSIS — Z01419 Encounter for gynecological examination (general) (routine) without abnormal findings: Secondary | ICD-10-CM | POA: Diagnosis not present

## 2019-12-15 LAB — HM MAMMOGRAPHY

## 2019-12-16 DIAGNOSIS — Z1151 Encounter for screening for human papillomavirus (HPV): Secondary | ICD-10-CM | POA: Diagnosis not present

## 2020-02-15 DIAGNOSIS — L820 Inflamed seborrheic keratosis: Secondary | ICD-10-CM | POA: Diagnosis not present

## 2020-02-15 DIAGNOSIS — D485 Neoplasm of uncertain behavior of skin: Secondary | ICD-10-CM | POA: Diagnosis not present

## 2020-02-15 DIAGNOSIS — C44619 Basal cell carcinoma of skin of left upper limb, including shoulder: Secondary | ICD-10-CM | POA: Diagnosis not present

## 2020-02-15 DIAGNOSIS — Z85828 Personal history of other malignant neoplasm of skin: Secondary | ICD-10-CM | POA: Diagnosis not present

## 2020-02-15 DIAGNOSIS — L570 Actinic keratosis: Secondary | ICD-10-CM | POA: Diagnosis not present

## 2020-03-24 ENCOUNTER — Encounter: Payer: Self-pay | Admitting: Medical

## 2020-03-24 ENCOUNTER — Other Ambulatory Visit: Payer: Self-pay

## 2020-03-24 ENCOUNTER — Ambulatory Visit (INDEPENDENT_AMBULATORY_CARE_PROVIDER_SITE_OTHER): Payer: BC Managed Care – PPO | Admitting: Medical

## 2020-03-24 ENCOUNTER — Other Ambulatory Visit: Payer: Self-pay | Admitting: Medical

## 2020-03-24 DIAGNOSIS — J301 Allergic rhinitis due to pollen: Secondary | ICD-10-CM | POA: Diagnosis not present

## 2020-03-24 DIAGNOSIS — J0100 Acute maxillary sinusitis, unspecified: Secondary | ICD-10-CM

## 2020-03-24 MED ORDER — AMOXICILLIN-POT CLAVULANATE 875-125 MG PO TABS: 1.0000 | ORAL_TABLET | Freq: Two times a day (BID) | ORAL | 0 refills | Status: DC

## 2020-03-24 MED ORDER — LISINOPRIL 5 MG PO TABS: 5.0000 mg | ORAL_TABLET | Freq: Every day | ORAL | 0 refills | Status: DC

## 2020-03-24 NOTE — Progress Notes (Signed)
   Subjective:    Patient ID: Katrina Kelly, female    DOB: Dec 23, 1960, 59 y.o.   MRN: [id number]  HPI  Virtual Visit via Telephone Note  I connected with Katrina Kelly on 03/24/20 at  4:00 PM EDT by telephone and verified that I am speaking with the correct person using two identifiers.  Location: Patient: home Provider: office   I discussed the limitations, risks, security and privacy concerns of performing an evaluation and management service by telephone and the availability of in person appointments. I also discussed with the patient that there may be a patient responsible charge related to this service. The patient expressed understanding and agreed to proceed.  Pt did not check bp, pulse or temp.  History of Present Illness: Pt in for 3-4 weeks of sinus pressure, nasal congestion and recent upper teeth. Pt does get seasonal allergies and usually 3 sinus infections a year. She is convinced has sinus infection  Pt states she hates nasal sprays. She is taking 2 antihistamines for allergic rhinitis.  Pt states more infection that allergies presently.  Pt also states she needs refill of her lisinopril 5 mg daily. Cardiologist never stopped/dc med and she is ran out yesterday.      Observations/Objective: General- no acute distress, pleasant, alert and oriented.  Assessment and Plan: History of both allergic rhinitis and sinusitis. Severe enough sinus pressure described with hx of sinus infections so after discussion rx'd augmentin antibitoic. Advised to continue zyrtec.  If signs and symptoms worsen or change notify us.  Follow up 7-10 days or as needed  Follow Up Instructions:    I discussed the assessment and treatment plan with the patient. The patient was provided an opportunity to ask questions and all were answered. The patient agreed with the plan and demonstrated an understanding of the instructions.   The patient was advised to call back or seek an in-person  evaluation if the symptoms worsen or if the condition fails to improve as anticipated.  I provided 15 minutes of non-face-to-face time during this encounter.   Mackie Pai, PA-C   Review of Systems     Objective:   Physical Exam        Assessment & Plan:

## 2020-03-24 NOTE — Patient Instructions (Addendum)
History of both allergic rhinitis and sinusitis. Severe enough sinus pressure described with hx of sinus infections so after discussion rx'd augmentin antibitoic. Advised to continue zyrtec.  If signs and symptoms worsen or change notify us.  Follow up 7-10 days or as needed

## 2020-04-04 DIAGNOSIS — L821 Other seborrheic keratosis: Secondary | ICD-10-CM | POA: Diagnosis not present

## 2020-04-04 DIAGNOSIS — C44619 Basal cell carcinoma of skin of left upper limb, including shoulder: Secondary | ICD-10-CM | POA: Diagnosis not present

## 2020-04-21 ENCOUNTER — Other Ambulatory Visit: Payer: Self-pay | Admitting: Medical

## 2020-05-05 ENCOUNTER — Encounter: Payer: Self-pay | Admitting: Family

## 2020-05-05 ENCOUNTER — Telehealth: Payer: Self-pay | Admitting: Family

## 2020-05-05 ENCOUNTER — Other Ambulatory Visit: Payer: Self-pay

## 2020-05-05 ENCOUNTER — Ambulatory Visit: Payer: BC Managed Care – PPO | Admitting: Family

## 2020-05-05 ENCOUNTER — Ambulatory Visit (INDEPENDENT_AMBULATORY_CARE_PROVIDER_SITE_OTHER): Payer: BC Managed Care – PPO | Admitting: Family

## 2020-05-05 VITALS — BP 105/69 | HR 77 | Temp 97.3°F | Resp 16 | Ht 67.0 in | Wt 196.0 lb

## 2020-05-05 DIAGNOSIS — Z9109 Other allergy status, other than to drugs and biological substances: Secondary | ICD-10-CM | POA: Diagnosis not present

## 2020-05-05 DIAGNOSIS — E785 Hyperlipidemia, unspecified: Secondary | ICD-10-CM | POA: Diagnosis not present

## 2020-05-05 DIAGNOSIS — E118 Type 2 diabetes mellitus with unspecified complications: Secondary | ICD-10-CM | POA: Diagnosis not present

## 2020-05-05 DIAGNOSIS — K219 Gastro-esophageal reflux disease without esophagitis: Secondary | ICD-10-CM

## 2020-05-05 DIAGNOSIS — Z1159 Encounter for screening for other viral diseases: Secondary | ICD-10-CM | POA: Diagnosis not present

## 2020-05-05 LAB — LIPID PANEL
Cholesterol: 158 mg/dL (ref 0–200)
HDL: 34.2 mg/dL — ABNORMAL LOW (ref >39.00)
LDL Cholesterol: 100 mg/dL — ABNORMAL HIGH (ref 0–99)
NonHDL: 123.89
Total CHOL/HDL Ratio: 5
Triglycerides: 120 mg/dL (ref 0.0–149.0)
VLDL: 24 mg/dL (ref 0.0–40.0)

## 2020-05-05 LAB — COMPREHENSIVE METABOLIC PANEL
ALT: 31 U/L (ref 0–35)
AST: 23 U/L (ref 0–37)
Albumin: 4.1 g/dL (ref 3.5–5.2)
Alkaline Phosphatase: 99 U/L (ref 39–117)
BUN: 14 mg/dL (ref 6–23)
CO2: 23 mEq/L (ref 19–32)
Calcium: 9.2 mg/dL (ref 8.4–10.5)
Chloride: 107 mEq/L (ref 96–112)
Creatinine, Ser: 0.8 mg/dL (ref 0.40–1.20)
GFR: 73.5 mL/min (ref >60.00)
Glucose, Bld: 153 mg/dL — ABNORMAL HIGH (ref 70–99)
Potassium: 3.8 mEq/L (ref 3.5–5.1)
Sodium: 139 mEq/L (ref 135–145)
Total Bilirubin: 0.5 mg/dL (ref 0.2–1.2)
Total Protein: 6.9 g/dL (ref 6.0–8.3)

## 2020-05-05 LAB — HEMOGLOBIN A1C: Hgb A1c MFr Bld: 7 % — ABNORMAL HIGH (ref 4.6–6.5)

## 2020-05-05 MED ORDER — ATORVASTATIN CALCIUM 10 MG PO TABS: 10.0000 mg | ORAL_TABLET | Freq: Every day | ORAL | 1 refills | Status: DC

## 2020-05-05 MED ORDER — LISINOPRIL 5 MG PO TABS: ORAL_TABLET | ORAL | 1 refills | Status: DC

## 2020-05-05 MED ORDER — POTASSIUM CHLORIDE CRYS ER 10 MEQ PO TBCR: 10.0000 meq | EXTENDED_RELEASE_TABLET | Freq: Every day | ORAL | 1 refills | Status: DC | PRN

## 2020-05-05 NOTE — Progress Notes (Signed)
Subjective:    Patient ID: Katrina Kelly, female    DOB: Aug 27, 1961, 59 y.o.   MRN: [id number]  HPI  Patient is a 59 yr old female who presents today for follow up.  DM2- maintained on carvedilol 3.125,lisinopril 5mg , imdur HS and prn lasix.   Lab Results  Component Value Date   HGBA1C 6.2 06/29/2019   HGBA1C 5.8 11/16/2018   HGBA1C 6.8 (H) 08/10/2018   Lab Results  Component Value Date   MICROALBUR <0.7 06/29/2019   LDLCALC 51 04/10/2018   CREATININE 0.75 06/29/2019   Hyperlipidemia- maintained on lipitor 10mg .    Lab Results  Component Value Date   CHOL 109 04/10/2018   HDL 31.90 (L) 04/10/2018   LDLCALC 51 04/10/2018   TRIG 133.0 04/10/2018   CHOLHDL 3 04/10/2018   Seasonal allergies- maintained on zyrtec 10mg . Having itchy eyes, AM congestion- improves as the day goes on.  Doesn't like to take nasal sprays.  GERD- stable with rare use of protonix.   Dr. Thurston Hole in [place] is cardiologist.  Wt Readings from Last 3 Encounters:  05/05/20 196 lb (88.9 kg)  06/29/19 191 lb (86.6 kg)  11/16/18 186 lb (84.4 kg)   BP Readings from Last 3 Encounters:  05/05/20 105/69  06/29/19 94/66  11/16/18 115/72      Review of Systems    see HPI  Past Medical History:  Diagnosis Date  . Allergy   . Anxiety   . Basal cell carcinoma    skin   . Cardiomyopathy ([place])    EF 35% per pt  . CHF (congestive heart failure) ([place])   . Diabetes type 2, uncontrolled ([place])   . Heartburn   . Hypertension      Social History   Socioeconomic History  . Marital status: Married    Spouse name: Not on file  . Number of children: Not on file  . Years of education: Not on file  . Highest education level: Not on file  Occupational History  . Not on file  Tobacco Use  . Smoking status: Former Research scientist (life sciences)  . Smokeless tobacco: Never Used  Substance and Sexual Activity  . Alcohol use: Yes    Alcohol/week: 1.0 standard drinks    Types: 1 Standard drinks or equivalent per week     Comment: twice monthly  . Drug use: No  . Sexual activity: Not on file  Other Topics Concern  . 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   Daughter/fiance and grandson live downstairs   Enjoys beach, decorating/yard work.   One dog   Social Determinants of Radio broadcast assistant Strain:   . Difficulty of Paying Living Expenses:   Food Insecurity:   . Worried About Charity fundraiser in the Last Year:   . Arboriculturist in the Last Year:   Transportation Needs:   . Film/video editor (Medical):   Marland Kitchen Lack of Transportation (Non-Medical):   Physical Activity:   . Days of Exercise per Week:   . Minutes of Exercise per Session:   Stress:   . Feeling of Stress :   Social Connections:   . Frequency of Communication with Friends and Family:   . Frequency of Social Gatherings with Friends and Family:   . Attends Religious Services:   . Active Member of Clubs or Organizations:   . Attends Archivist  Meetings:   Marland Kitchen Marital Status:   Intimate Partner Violence:   . Fear of Current or Ex-Partner:   . Emotionally Abused:   Marland Kitchen Physically Abused:   . Sexually Abused:     Past Surgical History:  Procedure Laterality Date  . CHOLECYSTECTOMY  2008  . COLONOSCOPY WITH PROPOFOL N/A 01/03/2017   Procedure: COLONOSCOPY WITH PROPOFOL;  Surgeon: Mauri Pole, MD;  [facility];  Service: Endoscopy;  Laterality: N/A;  . FOOT SURGERY  1982   "benign tumor bottom of right foot"  . NASAL SEPTUM SURGERY  2000  . TONSILLECTOMY  2000  . uterine ablation    . UVULECTOMY  2000    Family History  Problem Relation Age of Onset  . Diabetes Mother   . Hypertension Mother   . Cancer Father        lung  . Emphysema Father   . Diabetes Sister   . Diabetes Brother   . Leukemia Brother   . Stroke Brother   . Heart attack Maternal Uncle   . Breast cancer Paternal Aunt 1  . Lymphoma  Paternal Uncle   . Polycystic ovary syndrome Daughter   . Hypertension Daughter   . Diabetes Mellitus II Maternal Grandmother   . Colon cancer Neg Hx     Allergies  Allergen Reactions  . Lactose Intolerance (Gi)     GI symptoms    Current Outpatient Medications on File Prior to Visit  Medication Sig Dispense Refill  . atorvastatin (LIPITOR) 10 MG tablet Take 1 tablet (10 mg total) by mouth daily. 30 tablet 5  . carvedilol (COREG) 3.125 MG tablet Take 1 tablet (3.125 mg total) by mouth 2 (two) times daily with a meal. 60 tablet 3  . cetirizine (ZYRTEC) 10 MG tablet Take 10 mg by mouth daily.    . furosemide (LASIX) 40 MG tablet Take 40 mg by mouth daily as needed for edema.     . isosorbide mononitrate (IMDUR) 30 MG 24 hr tablet Take 30 mg by mouth at bedtime.     Marland Kitchen lisinopril (ZESTRIL) 5 MG tablet TAKE 1 TABLET(5 MG) BY MOUTH DAILY 30 tablet 0  . pantoprazole (PROTONIX) 40 MG tablet Take 1 tablet (40 mg total) by mouth daily as needed. TAKE 1 TABLET(40 MG) BY MOUTH DAILY 90 tablet 1  . potassium chloride (K-DUR,KLOR-CON) 10 MEQ tablet Take 10 mEq by mouth daily as needed (swelling).      No current facility-administered medications on file prior to visit.    BP 105/69 (BP Location: Right Arm, Patient Position: Sitting, Cuff Size: Large)   Pulse 77   Temp (!) 97.3 F (36.3 C) (Temporal)   Resp 16   Ht 5\' 7"  (1.702 m)   Wt 196 lb (88.9 kg)   SpO2 98%   BMI 30.70 kg/m    Objective:   Physical Exam Constitutional:      Appearance: She is well-developed.  Neck:     Thyroid: No thyromegaly.  Cardiovascular:     Rate and Rhythm: Normal rate and regular rhythm.     Heart sounds: Normal heart sounds. No murmur.  Pulmonary:     Effort: Pulmonary effort is normal. No respiratory distress.     Breath sounds: Normal breath sounds. No wheezing.  Musculoskeletal:     Cervical back: Neck supple.  Skin:    General: Skin is warm and dry.  Neurological:     Mental Status: She is  alert and oriented to person,  place, and time.  Psychiatric:        Behavior: Behavior normal.        Thought Content: Thought content normal.        Judgment: Judgment normal.           Assessment & Plan:  DM2- clinically stable on current regimen, obtain A1C.   GERD- stable, continue prn PPI.  Allergies- stable, continue zyrtec.  Hyperlipidemia- ran out of statin a few weeks back, refill. Obtain follow up lipid panel.   This visit occurred during the SARS-CoV-2 public health emergency.  Safety protocols were in place, including screening questions prior to the visit, additional usage of staff PPE, and extensive cleaning of exam room while observing appropriate contact time as indicated for disinfecting solutions.

## 2020-05-05 NOTE — Telephone Encounter (Signed)
Medical records released faxed to both [facility] and [facility]

## 2020-05-05 NOTE — Telephone Encounter (Signed)
Also call [facility] on [address] to request DM eye exam.

## 2020-05-05 NOTE — Telephone Encounter (Signed)
Please call [facility] and request copy of pap and mammo.

## 2020-05-08 LAB — HEPATITIS C ANTIBODY
Hepatitis C Ab: NONREACTIVE
SIGNAL TO CUT-OFF: 0.01 (ref <1.00)

## 2020-09-05 ENCOUNTER — Other Ambulatory Visit: Payer: Self-pay

## 2020-09-05 ENCOUNTER — Ambulatory Visit: Payer: BC Managed Care – PPO | Admitting: Family

## 2020-09-05 ENCOUNTER — Telehealth: Payer: Self-pay | Admitting: Family

## 2020-09-05 VITALS — BP 114/58 | HR 76 | Temp 98.2°F | Resp 16 | Ht 67.5 in | Wt 194.0 lb

## 2020-09-05 DIAGNOSIS — K219 Gastro-esophageal reflux disease without esophagitis: Secondary | ICD-10-CM

## 2020-09-05 DIAGNOSIS — E118 Type 2 diabetes mellitus with unspecified complications: Secondary | ICD-10-CM | POA: Diagnosis not present

## 2020-09-05 DIAGNOSIS — E785 Hyperlipidemia, unspecified: Secondary | ICD-10-CM

## 2020-09-05 DIAGNOSIS — I428 Other cardiomyopathies: Secondary | ICD-10-CM | POA: Diagnosis not present

## 2020-09-05 MED ORDER — ATORVASTATIN CALCIUM 10 MG PO TABS: 10.0000 mg | ORAL_TABLET | Freq: Every day | ORAL | 1 refills | Status: DC

## 2020-09-05 NOTE — Progress Notes (Signed)
Subjective:    Patient ID: Katrina Kelly, female    DOB: Oct 12, 1961, 59 y.o.   MRN: [id number]  HPI   Patient is a 59 yr old female who presents today for follow up.   NICM- she is on coreg, prn lasix and lisinopril. She is followed by Dr. Thurston Hole (cardiology at [facility]).  Sees him in October.  Denies SOB.  Denies LE edema. She does report some leg cramping.   BP Readings from Last 3 Encounters:  05/05/20 105/69  06/29/19 94/66  11/16/18 115/72   DM2- Not checking sugars. Eating better recently. Lab Results  Component Value Date   HGBA1C 7.0 (H) 05/05/2020   HGBA1C 6.2 06/29/2019   HGBA1C 5.8 11/16/2018   Lab Results  Component Value Date   MICROALBUR <0.7 06/29/2019   LDLCALC 100 (H) 05/05/2020   CREATININE 0.80 05/05/2020   GERD-maintained on pantoprazole prn.  Not using every day  Hyperlipidemia-  Lab Results  Component Value Date   CHOL 158 05/05/2020   HDL 34.20 (L) 05/05/2020   LDLCALC 100 (H) 05/05/2020   TRIG 120.0 05/05/2020   CHOLHDL 5 05/05/2020      Review of Systems    see HPI  Past Medical History:  Diagnosis Date  . Allergy   . Anxiety   . Basal cell carcinoma    skin   . Cardiomyopathy ([place])    EF 35% per pt  . CHF (congestive heart failure) ([place])   . Diabetes type 2, uncontrolled ([place])   . Heartburn   . Hypertension      Social History   Socioeconomic History  . Marital status: Married    Spouse name: Not on file  . Number of children: Not on file  . Years of education: Not on file  . Highest education level: Not on file  Occupational History  . Not on file  Tobacco Use  . Smoking status: Former Research scientist (life sciences)  . Smokeless tobacco: Never Used  Substance and Sexual Activity  . Alcohol use: Yes    Alcohol/week: 1.0 standard drink    Types: 1 Standard drinks or equivalent per week    Comment: twice monthly  . Drug use: No  . Sexual activity: Not on file  Other Topics Concern  . 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   Daughter/fiance and grandson live downstairs   Enjoys beach, decorating/yard work.   One dog   Social Determinants of Health   Financial Resource Strain:   . Difficulty of Paying Living Expenses: Not on file  Food Insecurity:   . Worried About Charity fundraiser in the Last Year: Not on file  . Ran Out of Food in the Last Year: Not on file  Transportation Needs:   . Lack of Transportation (Medical): Not on file  . Lack of Transportation (Non-Medical): Not on file  Physical Activity:   . Days of Exercise per Week: Not on file  . Minutes of Exercise per Session: Not on file  Stress:   . Feeling of Stress : Not on file  Social Connections:   . Frequency of Communication with Friends and Family: Not on file  . Frequency of Social Gatherings with Friends and Family: Not on file  . Attends Religious Services: Not on file  . Active Member of Clubs or Organizations: Not on file  . Attends Archivist Meetings:  Not on file  . Marital Status: Not on file  Intimate Partner Violence:   . Fear of Current or Ex-Partner: Not on file  . Emotionally Abused: Not on file  . Physically Abused: Not on file  . Sexually Abused: Not on file    Past Surgical History:  Procedure Laterality Date  . CHOLECYSTECTOMY  2008  . COLONOSCOPY WITH PROPOFOL N/A 01/03/2017   Procedure: COLONOSCOPY WITH PROPOFOL;  Surgeon: Mauri Pole, MD;  [facility];  Service: Endoscopy;  Laterality: N/A;  . FOOT SURGERY  1982   "benign tumor bottom of right foot"  . NASAL SEPTUM SURGERY  2000  . TONSILLECTOMY  2000  . uterine ablation    . UVULECTOMY  2000    Family History  Problem Relation Age of Onset  . Diabetes Mother   . Hypertension Mother   . Cancer Father        lung  . Emphysema Father   . Diabetes Sister   . Diabetes Brother   . Leukemia Brother   . Stroke Brother   . Heart attack  Maternal Uncle   . Breast cancer Paternal Aunt 28  . Lymphoma Paternal Uncle   . Polycystic ovary syndrome Daughter   . Hypertension Daughter   . Diabetes Mellitus II Maternal Grandmother   . Colon cancer Neg Hx     Allergies  Allergen Reactions  . Lactose Intolerance (Gi)     GI symptoms    Current Outpatient Medications on File Prior to Visit  Medication Sig Dispense Refill  . atorvastatin (LIPITOR) 10 MG tablet Take 1 tablet (10 mg total) by mouth daily. 90 tablet 1  . carvedilol (COREG) 3.125 MG tablet Take 1 tablet (3.125 mg total) by mouth 2 (two) times daily with a meal. 60 tablet 3  . cetirizine (ZYRTEC) 10 MG tablet Take 10 mg by mouth daily.    . furosemide (LASIX) 40 MG tablet Take 40 mg by mouth daily as needed for edema.     . isosorbide mononitrate (IMDUR) 30 MG 24 hr tablet Take 30 mg by mouth at bedtime.     Marland Kitchen lisinopril (ZESTRIL) 5 MG tablet TAKE 1 TABLET(5 MG) BY MOUTH DAILY 90 tablet 1  . pantoprazole (PROTONIX) 40 MG tablet Take 1 tablet (40 mg total) by mouth daily as needed. TAKE 1 TABLET(40 MG) BY MOUTH DAILY 90 tablet 1  . potassium chloride (KLOR-CON) 10 MEQ tablet Take 1 tablet (10 mEq total) by mouth daily as needed (swelling). 30 tablet 1   No current facility-administered medications on file prior to visit.    BP (!) 114/58 (BP Location: Right Arm, Patient Position: Sitting, Cuff Size: Small)   Pulse 76   Temp 98.2 F (36.8 C) (Oral)   Resp 16   Ht 5' 7.5" (1.715 m)   Wt 194 lb (88 kg)   SpO2 100%   BMI 29.94 kg/m    Objective:   Physical Exam Constitutional:      Appearance: She is well-developed.  Neck:     Thyroid: No thyromegaly.  Cardiovascular:     Rate and Rhythm: Normal rate and regular rhythm.     Pulses:          Dorsalis pedis pulses are 2+ on the right side and 2+ on the left side.       Posterior tibial pulses are 2+ on the right side and 2+ on the left side.     Heart sounds: Normal heart sounds.  No murmur heard.    Pulmonary:     Effort: Pulmonary effort is normal. No respiratory distress.     Breath sounds: Normal breath sounds. No wheezing.  Musculoskeletal:     Cervical back: Neck supple.  Skin:    General: Skin is warm and dry.  Neurological:     Mental Status: She is alert and oriented to person, place, and time.  Psychiatric:        Behavior: Behavior normal.        Thought Content: Thought content normal.        Judgment: Judgment normal.           Assessment & Plan:  DM2- follow up A1C is very high. Will add metformin (see phone note). Lab Results  Component Value Date   HGBA1C 11.1 (H) 09/05/2020  Continue Ace for renal protection.   NICM- clinically stable/euvolemic. Pt advised to keep her upcoming appointment with cardiology.  Hyperlipidemia- LDL right at goal. Continue lipitor at current dose.   GERD- stable with prn use of protonix.   I counseled patient on the importance of covid-19 vaccination. She declines at this time but will think about it.

## 2020-09-05 NOTE — Telephone Encounter (Signed)
Please call [facility] on [address] and request copy of her DM eye.

## 2020-09-06 ENCOUNTER — Encounter: Payer: Self-pay | Admitting: Family

## 2020-09-06 ENCOUNTER — Telehealth: Payer: Self-pay | Admitting: Family

## 2020-09-06 LAB — COMPREHENSIVE METABOLIC PANEL
AG Ratio: 1.5 (calc) (ref 1.0–2.5)
ALT: 24 U/L (ref 6–29)
AST: 30 U/L (ref 10–35)
Albumin: 4 g/dL (ref 3.6–5.1)
Alkaline phosphatase (APISO): 133 U/L (ref 37–153)
BUN: 14 mg/dL (ref 7–25)
CO2: 23 mmol/L (ref 20–32)
Calcium: 9.2 mg/dL (ref 8.6–10.4)
Chloride: 101 mmol/L (ref 98–110)
Creat: 0.8 mg/dL (ref 0.50–1.05)
Globulin: 2.6 g/dL (calc) (ref 1.9–3.7)
Glucose, Bld: 296 mg/dL — ABNORMAL HIGH (ref 65–99)
Potassium: 3.9 mmol/L (ref 3.5–5.3)
Sodium: 134 mmol/L — ABNORMAL LOW (ref 135–146)
Total Bilirubin: 0.6 mg/dL (ref 0.2–1.2)
Total Protein: 6.6 g/dL (ref 6.1–8.1)

## 2020-09-06 LAB — HEMOGLOBIN A1C
Hgb A1c MFr Bld: 11.1 % of total Hgb — ABNORMAL HIGH (ref <5.7)
Mean Plasma Glucose: 272 (calc)
eAG (mmol/L): 15.1 (calc)

## 2020-09-06 NOTE — Telephone Encounter (Signed)
Results and provider's advise given to patient. She refuses to take metformin "did not like the way it made her fell last time"  Patient has a meter and will send readings in about one week.   She asked if Victoza was an option "sister takes it" Please advise

## 2020-09-06 NOTE — Telephone Encounter (Signed)
Please advise pt that I have reviewed her A1C.  Sugar has become very uncontrolled.  Her sugar when we checked was 296 and A1C has risen from 7.0 to 11.1.    I would like her to begin metformin 500mg  bid for her sugar.  She should work hard on diabetic diet: work on avoiding concentrated sweets, and limiting white carbs (rice/bread/pasta/potatoes). Instead substitute whole grain versions with reasonable portions.  I would like her to check her blood sugar twice daily and send me her readings via mychart in 1 week.    Let me know if she needs a meter and I will send one to her pharmacy.

## 2020-09-06 NOTE — Telephone Encounter (Signed)
Medical records release faxed to [facility]

## 2020-09-07 ENCOUNTER — Encounter: Payer: Self-pay | Admitting: Family

## 2020-09-07 NOTE — Telephone Encounter (Signed)
See mychart message. I have pended the rx's. Can you please clarify which location she would like them sent? Tks

## 2020-09-11 ENCOUNTER — Telehealth: Payer: Self-pay

## 2020-09-11 MED ORDER — ONETOUCH DELICA LANCETS 30G MISC: 0 refills | Status: DC

## 2020-09-11 MED ORDER — OZEMPIC (0.25 OR 0.5 MG/DOSE) 2 MG/1.5ML ~~LOC~~ SOPN: PEN_INJECTOR | SUBCUTANEOUS | 2 refills | Status: DC

## 2020-09-11 NOTE — Telephone Encounter (Signed)
PA initiated via Covermymeds; KEY: [id number]. PA approved.

## 2020-09-13 ENCOUNTER — Telehealth: Payer: Self-pay | Admitting: Family

## 2020-09-13 MED ORDER — SITAGLIPTIN PHOSPHATE 100 MG PO TABS: 100.0000 mg | ORAL_TABLET | Freq: Every day | ORAL | 5 refills | Status: DC

## 2020-09-13 MED ORDER — METFORMIN HCL 500 MG PO TABS: 500.0000 mg | ORAL_TABLET | Freq: Two times a day (BID) | ORAL | 1 refills | Status: DC

## 2020-09-13 NOTE — Telephone Encounter (Signed)
Pt would like to transfer to our office. Pt says her mother and sister see M[facility] and she lives closer to our office and would like to transfer her. Ok per both providers?

## 2020-09-13 NOTE — Telephone Encounter (Signed)
Pt scheduled 11/06/20.

## 2020-09-13 NOTE — Telephone Encounter (Signed)
Ok with me 

## 2020-09-13 NOTE — Telephone Encounter (Signed)
Okay Bridgett, please find new pt appt slot in my schedule

## 2020-09-13 NOTE — Telephone Encounter (Signed)
See mychart.  

## 2020-09-13 NOTE — Addendum Note (Signed)
Addended by: Debbrah Alar on: 09/13/2020 12:36 PM   Modules accepted: Orders

## 2020-09-14 ENCOUNTER — Other Ambulatory Visit: Payer: Self-pay | Admitting: Family

## 2020-10-18 DIAGNOSIS — Z6829 Body mass index (BMI) 29.0-29.9, adult: Secondary | ICD-10-CM | POA: Diagnosis not present

## 2020-10-18 DIAGNOSIS — R0602 Shortness of breath: Secondary | ICD-10-CM | POA: Diagnosis not present

## 2020-10-18 DIAGNOSIS — I428 Other cardiomyopathies: Secondary | ICD-10-CM | POA: Diagnosis not present

## 2020-10-18 DIAGNOSIS — R072 Precordial pain: Secondary | ICD-10-CM | POA: Diagnosis not present

## 2020-11-06 ENCOUNTER — Encounter: Payer: BC Managed Care – PPO | Admitting: Family

## 2020-11-27 ENCOUNTER — Encounter: Payer: Self-pay | Admitting: Family

## 2020-11-27 ENCOUNTER — Other Ambulatory Visit: Payer: Self-pay

## 2020-11-27 ENCOUNTER — Ambulatory Visit: Payer: BC Managed Care – PPO | Admitting: Family

## 2020-11-27 VITALS — BP 102/74 | HR 93 | Temp 97.9°F | Ht 67.5 in | Wt 190.2 lb

## 2020-11-27 DIAGNOSIS — E118 Type 2 diabetes mellitus with unspecified complications: Secondary | ICD-10-CM

## 2020-11-27 DIAGNOSIS — R5383 Other fatigue: Secondary | ICD-10-CM

## 2020-11-27 DIAGNOSIS — I428 Other cardiomyopathies: Secondary | ICD-10-CM | POA: Diagnosis not present

## 2020-11-27 DIAGNOSIS — F320 Major depressive disorder, single episode, mild: Secondary | ICD-10-CM

## 2020-11-27 DIAGNOSIS — E785 Hyperlipidemia, unspecified: Secondary | ICD-10-CM

## 2020-11-27 MED ORDER — BUPROPION HCL ER (XL) 150 MG PO TB24: 150.0000 mg | ORAL_TABLET | Freq: Every day | ORAL | 3 refills | Status: DC

## 2020-11-27 NOTE — Patient Instructions (Signed)
Start wellbutrin  We discussed today starting medication called Wellbutrin.  As also discussed, you must limit alcohol on this medication as alcohol and Wellbutrin together may increase your risk for seizure.  You may drink no more than 1 alcoholic beverage on this medication.  A standard drink is 12 ounces of regular beer, which is usually about 5% alcohol OR 5 ounces of wine, which is typically about 12% alcohol OR   1.5 ounces of distilled spirits, which is about 40% alcohol  Nice to meet you again!   Referral to pulmonology Let us know if you dont hear back within a week in regards to an appointment being scheduled.

## 2020-11-27 NOTE — Progress Notes (Signed)
Subjective:    Patient ID: Katrina Kelly, female    DOB: 09/20/1961, 59 y.o.   MRN: [id number]  CC: Katrina Kelly is a 59 y.o. female who presents today to establish care.    HPI: Complains of increased irritability, depression. No anxiety.  No si/hi.Describes increased stress at home.  Lives with daughter, fiance, 2 grandchilren ( 56, 2 years). No trouble sleeping. Sleep is not restorative. She has been on wellbutrin in the past.   Cardiomyopathy- recent visit last month whom she sees every 2 years.  No changes made. Follows with Dr Pasi, cardiology whom prescribes coreg, lasix, imdur, lisinopril, potassium chloride   DM- FBG 140 on average. Compliant with ozempic 0.5mg , and pleased with weight loss. Canont take metformin as feels nauseated.   HLD- compliant with lipitor  No alcohol use. No h/o seizure. No h/o anorexia, bulminia.     HISTORY:  Past Medical History:  Diagnosis Date  . Allergy   . Anxiety   . Basal cell carcinoma    skin   . Cardiomyopathy ([place])    EF 35% per pt  . CHF (congestive heart failure) ([place])   . Diabetes type 2, uncontrolled ([place])   . Heartburn   . Hypertension    Past Surgical History:  Procedure Laterality Date  . CHOLECYSTECTOMY  2008  . COLONOSCOPY WITH PROPOFOL N/A 01/03/2017   Procedure: COLONOSCOPY WITH PROPOFOL;  Surgeon: Mauri Pole, MD;  Location: [facility];  Service: Endoscopy;  Laterality: N/A;  . FOOT SURGERY  1982   "benign tumor bottom of right foot"  . NASAL SEPTUM SURGERY  2000  . TONSILLECTOMY  2000  . uterine ablation    . UVULECTOMY  2000   Family History  Problem Relation Age of Onset  . Diabetes Mother   . Hypertension Mother   . Cancer Father        lung  . Emphysema Father   . Diabetes Sister   . Diabetes Brother   . Leukemia Brother   . Stroke Brother   . Heart attack Maternal Uncle   . Breast cancer Paternal Aunt 110  . Lymphoma Paternal Uncle   . Polycystic ovary syndrome Daughter   .  Hypertension Daughter   . Diabetes Mellitus II Maternal Grandmother   . Colon cancer Neg Hx     Allergies: Metformin and related and Lactose intolerance (gi) Current Outpatient Medications on File Prior to Visit  Medication Sig Dispense Refill  . atorvastatin (LIPITOR) 10 MG tablet Take 1 tablet (10 mg total) by mouth daily. 90 tablet 1  . carvedilol (COREG) 3.125 MG tablet Take 1 tablet (3.125 mg total) by mouth 2 (two) times daily with a meal. 60 tablet 3  . cetirizine (ZYRTEC) 10 MG tablet Take 10 mg by mouth daily.    . furosemide (LASIX) 40 MG tablet Take 40 mg by mouth daily as needed for edema.     . isosorbide mononitrate (IMDUR) 30 MG 24 hr tablet Take 30 mg by mouth at bedtime.     . Lancets (ONETOUCH DELICA PLUS [id number]) MISC USE AS DIRECTED 100 each 0  . lisinopril (ZESTRIL) 5 MG tablet TAKE 1 TABLET(5 MG) BY MOUTH DAILY 90 tablet 1  . nitroGLYCERIN (NITROSTAT) 0.4 MG SL tablet SMARTSIG:1 Tablet(s) Sublingual PRN    . pantoprazole (PROTONIX) 40 MG tablet Take 1 tablet (40 mg total) by mouth daily as needed. TAKE 1 TABLET(40 MG) BY MOUTH DAILY 90 tablet 1  . potassium chloride (KLOR-CON)  10 MEQ tablet Take 1 tablet (10 mEq total) by mouth daily as needed (swelling). 30 tablet 1  . POTASSIUM PO Take 1 tablet by mouth daily.    . Semaglutide,0.25 or 0.5MG /DOS, (OZEMPIC, 0.25 OR 0.5 MG/DOSE,) 2 MG/1.5ML SOPN 0.25 mg SQ once weekly for 4 weeks, then increase to 0.5 mg SQ once weekly 1.5 mL 2   No current facility-administered medications on file prior to visit.    Social History   Tobacco Use  . Smoking status: Former Research scientist (life sciences)  . Smokeless tobacco: Never Used  Substance Use Topics  . Alcohol use: Yes    Alcohol/week: 1.0 standard drink    Types: 1 Standard drinks or equivalent per week    Comment: twice monthly  . Drug use: No    Review of Systems  Constitutional: Negative for chills and fever.  Respiratory: Negative for cough.   Cardiovascular: Negative for chest pain  and palpitations.  Gastrointestinal: Negative for nausea and vomiting.  Psychiatric/Behavioral: Positive for sleep disturbance. Negative for suicidal ideas. The patient is not nervous/anxious.       Objective:    BP 102/74   Pulse 93   Temp 97.9 F (36.6 C)   Ht 5' 7.5" (1.715 m)   Wt 190 lb 3.2 oz (86.3 kg)   SpO2 98%   BMI 29.35 kg/m  BP Readings from Last 3 Encounters:  11/27/20 102/74  09/05/20 (!) 114/58  05/05/20 105/69   Wt Readings from Last 3 Encounters:  11/27/20 190 lb 3.2 oz (86.3 kg)  09/05/20 194 lb (88 kg)  05/05/20 196 lb (88.9 kg)    Physical Exam Vitals reviewed.  Constitutional:      Appearance: She is well-developed and well-nourished.  Eyes:     Conjunctiva/sclera: Conjunctivae normal.  Cardiovascular:     Rate and Rhythm: Normal rate and regular rhythm.     Pulses: Normal pulses.     Heart sounds: Normal heart sounds.  Pulmonary:     Effort: Pulmonary effort is normal.     Breath sounds: Normal breath sounds. No wheezing, rhonchi or rales.  Skin:    General: Skin is warm and dry.  Neurological:     Mental Status: She is alert.  Psychiatric:        Mood and Affect: Mood and affect normal.        Speech: Speech normal.        Behavior: Behavior normal.        Thought Content: Thought content normal.        Assessment & Plan:   Problem List Items Addressed This Visit      Cardiovascular and Mediastinum   NICM (nonischemic cardiomyopathy) ([place])    Stable. She follows with cardiology for medications, continue  coreg, lasix, imdur, lisinopril, potassium chloride.      Relevant Medications   nitroGLYCERIN (NITROSTAT) 0.4 MG SL tablet     Endocrine   Controlled diabetes mellitus type 2 with complications (HCC)    Lab Results  Component Value Date   HGBA1C 11.1 (H) 09/05/2020   Uncontrolled. Pending a1c. Continue ozempic 0.5mg         Other   Depression, major, single episode, mild (HCC)    Uncontrolled. Trial of wellbutrin.  Close follow up      Relevant Medications   buPROPion (WELLBUTRIN XL) 150 MG 24 hr tablet   Fatigue - Primary    Suspect multifactorial. Pending labs, referral for OSA evaluation.       Relevant Medications  buPROPion (WELLBUTRIN XL) 150 MG 24 hr tablet   Other Relevant Orders   CBC with Differential/Platelet   Comprehensive metabolic panel   TSH   VITAMIN D 25 Hydroxy (Vit-D Deficiency, Fractures)   B12 and Folate Panel   Ambulatory referral to Pulmonology   Hyperlipidemia    Pending lipid panel. Continue lipitor 10mg       Relevant Medications   nitroGLYCERIN (NITROSTAT) 0.4 MG SL tablet       I have discontinued Katrina Kelly's metFORMIN. I am also having her start on buPROPion. Additionally, I am having her maintain her isosorbide mononitrate, cetirizine, furosemide, carvedilol, pantoprazole, potassium chloride, lisinopril, atorvastatin, Ozempic (0.25 or 0.5 MG/DOSE), OneTouch Delica Plus [id number], nitroGLYCERIN, and POTASSIUM PO.   Meds ordered this encounter  Medications  . buPROPion (WELLBUTRIN XL) 150 MG 24 hr tablet    Sig: Take 1 tablet (150 mg total) by mouth daily with breakfast.    Dispense:  90 tablet    Refill:  3    Order Specific Question:   Supervising Provider    Answer:   Crecencio Mc [[id number]]    Return precautions given.   Risks, benefits, and alternatives of the medications and treatment plan prescribed today were discussed, and patient expressed understanding.   Education regarding symptom management and diagnosis given to patient on AVS.  Continue to follow with Burnard Hawthorne, FNP for routine health maintenance.   Salley Hews and I agreed with plan.   Mable Paris, FNP

## 2020-11-28 NOTE — Assessment & Plan Note (Signed)
Stable. She follows with cardiology for medications, continue  coreg, lasix, imdur, lisinopril, potassium chloride.

## 2020-11-28 NOTE — Assessment & Plan Note (Signed)
Pending lipid panel. Continue lipitor 10mg.  

## 2020-11-28 NOTE — Assessment & Plan Note (Signed)
Suspect multifactorial. Pending labs, referral for OSA evaluation.

## 2020-11-28 NOTE — Assessment & Plan Note (Signed)
Uncontrolled. Trial of wellbutrin. Close follow up

## 2020-11-28 NOTE — Assessment & Plan Note (Signed)
Lab Results  Component Value Date   HGBA1C 11.1 (H) 09/05/2020   Uncontrolled. Pending a1c. Continue ozempic 0.5mg 

## 2020-12-12 ENCOUNTER — Other Ambulatory Visit (INDEPENDENT_AMBULATORY_CARE_PROVIDER_SITE_OTHER): Payer: BC Managed Care – PPO

## 2020-12-12 ENCOUNTER — Other Ambulatory Visit: Payer: Self-pay

## 2020-12-12 DIAGNOSIS — R5383 Other fatigue: Secondary | ICD-10-CM

## 2020-12-12 LAB — B12 AND FOLATE PANEL
Folate: 10.8 ng/mL (ref >5.9)
Vitamin B-12: 322 pg/mL (ref 211–911)

## 2020-12-12 LAB — COMPREHENSIVE METABOLIC PANEL
ALT: 16 U/L (ref 0–35)
AST: 17 U/L (ref 0–37)
Albumin: 4 g/dL (ref 3.5–5.2)
Alkaline Phosphatase: 68 U/L (ref 39–117)
BUN: 9 mg/dL (ref 6–23)
CO2: 26 mEq/L (ref 19–32)
Calcium: 8.8 mg/dL (ref 8.4–10.5)
Chloride: 107 mEq/L (ref 96–112)
Creatinine, Ser: 0.89 mg/dL (ref 0.40–1.20)
GFR: 71.03 mL/min (ref >60.00)
Glucose, Bld: 133 mg/dL — ABNORMAL HIGH (ref 70–99)
Potassium: 3.8 mEq/L (ref 3.5–5.1)
Sodium: 141 mEq/L (ref 135–145)
Total Bilirubin: 0.9 mg/dL (ref 0.2–1.2)
Total Protein: 6.6 g/dL (ref 6.0–8.3)

## 2020-12-12 LAB — CBC WITH DIFFERENTIAL/PLATELET
Basophils Absolute: 0 10*3/uL (ref 0.0–0.1)
Basophils Relative: 0.5 % (ref 0.0–3.0)
Eosinophils Absolute: 0.4 10*3/uL (ref 0.0–0.7)
Eosinophils Relative: 5.5 % — ABNORMAL HIGH (ref 0.0–5.0)
HCT: 38.2 % (ref 36.0–46.0)
Hemoglobin: 12.9 g/dL (ref 12.0–15.0)
Lymphocytes Relative: 27 % (ref 12.0–46.0)
Lymphs Abs: 2.2 10*3/uL (ref 0.7–4.0)
MCHC: 33.9 g/dL (ref 30.0–36.0)
MCV: 87.9 fl (ref 78.0–100.0)
Monocytes Absolute: 0.8 10*3/uL (ref 0.1–1.0)
Monocytes Relative: 9.5 % (ref 3.0–12.0)
Neutro Abs: 4.6 10*3/uL (ref 1.4–7.7)
Neutrophils Relative %: 57.5 % (ref 43.0–77.0)
Platelets: 268 10*3/uL (ref 150.0–400.0)
RBC: 4.35 Mil/uL (ref 3.87–5.11)
RDW: 13.6 % (ref 11.5–15.5)
WBC: 8 10*3/uL (ref 4.0–10.5)

## 2020-12-12 LAB — VITAMIN D 25 HYDROXY (VIT D DEFICIENCY, FRACTURES): VITD: 15.54 ng/mL — ABNORMAL LOW (ref 30.00–100.00)

## 2020-12-12 LAB — TSH: TSH: 3.19 u[IU]/mL (ref 0.35–4.50)

## 2020-12-15 ENCOUNTER — Other Ambulatory Visit: Payer: Self-pay | Admitting: Family

## 2020-12-16 ENCOUNTER — Other Ambulatory Visit: Payer: Self-pay | Admitting: Family

## 2020-12-16 DIAGNOSIS — R899 Unspecified abnormal finding in specimens from other organs, systems and tissues: Secondary | ICD-10-CM

## 2020-12-18 ENCOUNTER — Other Ambulatory Visit: Payer: Self-pay

## 2020-12-18 MED ORDER — "BD DISP NEEDLE 25G X 1"" MISC": 0 refills | Status: DC

## 2020-12-18 MED ORDER — CYANOCOBALAMIN 1000 MCG/ML IJ SOLN: INTRAMUSCULAR | 0 refills | Status: DC

## 2020-12-18 MED ORDER — "BD ECLIPSE SYRINGE 25G X 5/8"" 1 ML MISC": 0 refills | Status: DC

## 2020-12-29 ENCOUNTER — Institutional Professional Consult (permissible substitution): Payer: BC Managed Care – PPO | Admitting: Adult Health

## 2021-01-02 DIAGNOSIS — Z130 Encounter for screening for diseases of the blood and blood-forming organs and certain disorders involving the immune mechanism: Secondary | ICD-10-CM | POA: Diagnosis not present

## 2021-01-02 DIAGNOSIS — Z1389 Encounter for screening for other disorder: Secondary | ICD-10-CM | POA: Diagnosis not present

## 2021-01-02 DIAGNOSIS — Z6828 Body mass index (BMI) 28.0-28.9, adult: Secondary | ICD-10-CM | POA: Diagnosis not present

## 2021-01-02 DIAGNOSIS — Z01419 Encounter for gynecological examination (general) (routine) without abnormal findings: Secondary | ICD-10-CM | POA: Diagnosis not present

## 2021-01-02 DIAGNOSIS — Z1231 Encounter for screening mammogram for malignant neoplasm of breast: Secondary | ICD-10-CM | POA: Diagnosis not present

## 2021-01-04 ENCOUNTER — Ambulatory Visit: Payer: BC Managed Care – PPO

## 2021-01-11 ENCOUNTER — Ambulatory Visit: Payer: BC Managed Care – PPO

## 2021-01-12 ENCOUNTER — Institutional Professional Consult (permissible substitution): Payer: BC Managed Care – PPO | Admitting: Pulmonary Disease

## 2021-01-15 ENCOUNTER — Encounter: Payer: Self-pay | Admitting: Family

## 2021-01-15 ENCOUNTER — Ambulatory Visit: Payer: BC Managed Care – PPO | Admitting: Family

## 2021-01-15 ENCOUNTER — Other Ambulatory Visit: Payer: Self-pay

## 2021-01-15 VITALS — BP 126/74 | HR 97 | Temp 98.0°F | Ht 67.5 in | Wt 184.2 lb

## 2021-01-15 DIAGNOSIS — E785 Hyperlipidemia, unspecified: Secondary | ICD-10-CM

## 2021-01-15 DIAGNOSIS — E538 Deficiency of other specified B group vitamins: Secondary | ICD-10-CM | POA: Insufficient documentation

## 2021-01-15 DIAGNOSIS — E118 Type 2 diabetes mellitus with unspecified complications: Secondary | ICD-10-CM

## 2021-01-15 DIAGNOSIS — I10 Essential (primary) hypertension: Secondary | ICD-10-CM

## 2021-01-15 DIAGNOSIS — I428 Other cardiomyopathies: Secondary | ICD-10-CM

## 2021-01-15 DIAGNOSIS — F320 Major depressive disorder, single episode, mild: Secondary | ICD-10-CM

## 2021-01-15 LAB — CBC WITH DIFFERENTIAL/PLATELET
Basophils Absolute: 0.1 10*3/uL (ref 0.0–0.1)
Basophils Relative: 2 % (ref 0.0–3.0)
Eosinophils Absolute: 0.2 10*3/uL (ref 0.0–0.7)
Eosinophils Relative: 2.7 % (ref 0.0–5.0)
HCT: 40.2 % (ref 36.0–46.0)
Hemoglobin: 13 g/dL (ref 12.0–15.0)
Lymphocytes Relative: 36.5 % (ref 12.0–46.0)
Lymphs Abs: 2.3 10*3/uL (ref 0.7–4.0)
MCHC: 32.4 g/dL (ref 30.0–36.0)
MCV: 90.8 fl (ref 78.0–100.0)
Monocytes Absolute: 0.6 10*3/uL (ref 0.1–1.0)
Monocytes Relative: 9.7 % (ref 3.0–12.0)
Neutro Abs: 3.1 10*3/uL (ref 1.4–7.7)
Neutrophils Relative %: 49.1 % (ref 43.0–77.0)
Platelets: 240 10*3/uL (ref 150.0–400.0)
RBC: 4.43 Mil/uL (ref 3.87–5.11)
RDW: 13.6 % (ref 11.5–15.5)
WBC: 6.3 10*3/uL (ref 4.0–10.5)

## 2021-01-15 LAB — POCT GLYCOSYLATED HEMOGLOBIN (HGB A1C)
HbA1c POC (<> result, manual entry): 5.3 % (ref 4.0–5.6)
Hemoglobin A1C: 5.3 % (ref 4.0–5.6)

## 2021-01-15 LAB — LIPID PANEL
Cholesterol: 103 mg/dL (ref 0–200)
HDL: 36.9 mg/dL — ABNORMAL LOW (ref >39.00)
LDL Cholesterol: 44 mg/dL (ref 0–99)
NonHDL: 65.91
Total CHOL/HDL Ratio: 3
Triglycerides: 108 mg/dL (ref 0.0–149.0)
VLDL: 21.6 mg/dL (ref 0.0–40.0)

## 2021-01-15 MED ORDER — OZEMPIC (0.25 OR 0.5 MG/DOSE) 2 MG/1.5ML ~~LOC~~ SOPN: 0.5000 mg | PEN_INJECTOR | SUBCUTANEOUS | 2 refills | Status: DC

## 2021-01-15 NOTE — Patient Instructions (Signed)
A1c looks great!  Keep up the great work!

## 2021-01-15 NOTE — Assessment & Plan Note (Signed)
Symptomatically stable. Continue coreg,imdur, lisinopril; lasix and  potassium chloride are prn.

## 2021-01-15 NOTE — Assessment & Plan Note (Signed)
Controlled. Continue coreg, lisinopril.

## 2021-01-15 NOTE — Assessment & Plan Note (Signed)
Anticipate controlled. Pending lipid panel. Continue lipitor 10mg 

## 2021-01-15 NOTE — Assessment & Plan Note (Signed)
Lab Results  Component Value Date   HGBA1C 11.1 (H) 09/05/2020   A1c is 5.3. Much improved. For additional weight loss, we agreed to continue ozempic 0.5mg . We may decrease to 0.25mg  ozempic at follow up.

## 2021-01-15 NOTE — Assessment & Plan Note (Signed)
Improved , continue wellbutrin 150mg .

## 2021-01-15 NOTE — Progress Notes (Signed)
Subjective:    Patient ID: Katrina Kelly, female    DOB: 1961-06-20, 60 y.o.   MRN: [id number]  CC: Katrina Kelly is a 60 y.o. female who presents today for follow up.   HPI: Feels well today. No complaints  Depression and anxiety- improved after started wellbutrin 150mg  . She is sleeping well.   b12 deficiency- had one dose and can feel energy has improved. Sister ( CMA) administered and she plans to do weekly x 3 and then monthly.    OSA evaluation- scheduled to see pulmonology 01/19/21  Cardiomyopathy and HTN - compliant with coreg,  imdur, lisinopril. Follows with Dr Pasi.   DM- FBG 140.  Compliant with ozempic 0.5mg . Eating less. Lost 6 lbs. Goal weight is 170.   HLD- compliant with lipitor.   Lasix only as needed - last 6-8 months ago.  No leg swelling, sob. She is only taking OTC potassium now prn for leg cramps. When she takes lasix 40mg  , she will take potassium chloride 15meq.   HISTORY:  Past Medical History:  Diagnosis Date  . Allergy   . Anxiety   . Basal cell carcinoma    skin   . Cardiomyopathy ([place])    EF 35% per pt  . CHF (congestive heart failure) ([place])   . Diabetes type 2, uncontrolled ([place])   . Heartburn   . Hypertension    Past Surgical History:  Procedure Laterality Date  . CHOLECYSTECTOMY  2008  . COLONOSCOPY WITH PROPOFOL N/A 01/03/2017   Procedure: COLONOSCOPY WITH PROPOFOL;  Surgeon: Mauri Pole, MD;  Location: [facility];  Service: Endoscopy;  Laterality: N/A;  . FOOT SURGERY  1982   "benign tumor bottom of right foot"  . NASAL SEPTUM SURGERY  2000  . TONSILLECTOMY  2000  . uterine ablation    . UVULECTOMY  2000   Family History  Problem Relation Age of Onset  . Diabetes Mother   . Hypertension Mother   . Cancer Father        lung  . Emphysema Father   . Diabetes Sister   . Diabetes Brother   . Leukemia Brother   . Stroke Brother   . Heart attack Maternal Uncle   . Breast cancer Paternal Aunt 37  . Lymphoma Paternal Uncle    . Polycystic ovary syndrome Daughter   . Hypertension Daughter   . Diabetes Mellitus II Maternal Grandmother   . Colon cancer Neg Hx     Allergies: Metformin and related and Lactose intolerance (gi) Current Outpatient Medications on File Prior to Visit  Medication Sig Dispense Refill  . atorvastatin (LIPITOR) 10 MG tablet Take 1 tablet (10 mg total) by mouth daily. 90 tablet 1  . buPROPion (WELLBUTRIN XL) 150 MG 24 hr tablet Take 1 tablet (150 mg total) by mouth daily with breakfast. 90 tablet 3  . carvedilol (COREG) 3.125 MG tablet Take 1 tablet (3.125 mg total) by mouth 2 (two) times daily with a meal. 60 tablet 3  . cetirizine (ZYRTEC) 10 MG tablet Take 10 mg by mouth daily.    . cyanocobalamin (,VITAMIN B-12,) 1000 MCG/ML injection Inject 20mL into skin SQ once a week for 4 weeks. 4 mL 0  . furosemide (LASIX) 40 MG tablet Take 40 mg by mouth daily as needed for edema.     . isosorbide mononitrate (IMDUR) 30 MG 24 hr tablet Take 30 mg by mouth at bedtime.     . Lancets (ONETOUCH DELICA PLUS [id number]) [place]  USE AS DIRECTED 100 each 0  . lisinopril (ZESTRIL) 5 MG tablet TAKE 1 TABLET(5 MG) BY MOUTH DAILY 90 tablet 1  . NEEDLE, DISP, 25 G (B-D DISP NEEDLE 25GX1") 25G X 1" MISC Used to give B-12 injections. 10 each 0  . pantoprazole (PROTONIX) 40 MG tablet Take 1 tablet (40 mg total) by mouth daily as needed. TAKE 1 TABLET(40 MG) BY MOUTH DAILY 90 tablet 1  . POTASSIUM PO Take 1 tablet by mouth daily.    . SYRINGE/NEEDLE, DISP, 1 ML (BD ECLIPSE SYRINGE) 25G X 5/8" 1 ML MISC Used to give B-12 injections. 50 each 0  . nitroGLYCERIN (NITROSTAT) 0.4 MG SL tablet SMARTSIG:1 Tablet(s) Sublingual PRN (Patient not taking: Reported on 01/15/2021)    . potassium chloride (KLOR-CON) 10 MEQ tablet Take 1 tablet (10 mEq total) by mouth daily as needed (swelling). (Patient not taking: Reported on 01/15/2021) 30 tablet 1   No current facility-administered medications on file prior to visit.    Social  History   Tobacco Use  . Smoking status: Former Research scientist (life sciences)  . Smokeless tobacco: Never Used  Substance Use Topics  . Alcohol use: Yes    Alcohol/week: 1.0 standard drink    Types: 1 Standard drinks or equivalent per week    Comment: twice monthly  . Drug use: No    Review of Systems  Constitutional: Negative for chills, fever and unexpected weight change.  HENT: Negative for congestion.   Respiratory: Negative for cough and shortness of breath.   Cardiovascular: Negative for chest pain, palpitations and leg swelling.  Gastrointestinal: Negative for nausea and vomiting.  Musculoskeletal: Negative for arthralgias and myalgias.  Skin: Negative for rash.  Neurological: Negative for headaches.  Hematological: Negative for adenopathy.  Psychiatric/Behavioral: Negative for confusion and sleep disturbance. The patient is not nervous/anxious.       Objective:    BP 126/74 (BP Location: Right Arm, Patient Position: Sitting, Cuff Size: Large)   Pulse 97   Temp 98 F (36.7 C) (Oral)   Ht 5' 7.5" (1.715 m)   Wt 184 lb 3.2 oz (83.6 kg)   SpO2 98%   BMI 28.42 kg/m  BP Readings from Last 3 Encounters:  01/15/21 126/74  11/27/20 102/74  09/05/20 (!) 114/58   Wt Readings from Last 3 Encounters:  01/15/21 184 lb 3.2 oz (83.6 kg)  11/27/20 190 lb 3.2 oz (86.3 kg)  09/05/20 194 lb (88 kg)    Physical Exam Vitals reviewed.  Constitutional:      Appearance: She is well-developed and well-nourished.  Eyes:     Conjunctiva/sclera: Conjunctivae normal.  Cardiovascular:     Rate and Rhythm: Normal rate and regular rhythm.     Pulses: Normal pulses.     Heart sounds: Normal heart sounds.  Pulmonary:     Effort: Pulmonary effort is normal.     Breath sounds: Normal breath sounds. No wheezing, rhonchi or rales.  Skin:    General: Skin is warm and dry.  Neurological:     Mental Status: She is alert.  Psychiatric:        Mood and Affect: Mood and affect normal.        Speech: Speech  normal.        Behavior: Behavior normal.        Thought Content: Thought content normal.        Assessment & Plan:   Problem List Items Addressed This Visit      Cardiovascular and Mediastinum  Hypertension    Controlled. Continue coreg, lisinopril.      NICM (nonischemic cardiomyopathy) (HCC)    Symptomatically stable. Continue coreg,imdur, lisinopril; lasix and  potassium chloride are prn.         Endocrine   Controlled diabetes mellitus type 2 with complications [facility])    Lab Results  Component Value Date   HGBA1C 11.1 (H) 09/05/2020   A1c is 5.3. Much improved. For additional weight loss, we agreed to continue ozempic 0.5mg . We may decrease to 0.25mg  ozempic at follow up.       Relevant Medications   Semaglutide,0.25 or 0.5MG /DOS, (OZEMPIC, 0.25 OR 0.5 MG/DOSE,) 2 MG/1.5ML SOPN   Other Relevant Orders   POCT HgB A1C     Other   B12 deficiency    Fatigue improved. Continue b12 injections monthly at home.       Depression, major, single episode, mild ([place]) - Primary    Improved , continue wellbutrin 150mg .      Hyperlipidemia    Anticipate controlled. Pending lipid panel. Continue lipitor 10mg       Relevant Orders   CBC with Differential/Platelet   Lipid panel       I have changed Mauricio Po Ozempic (0.25 or 0.5 MG/DOSE). I am also having her maintain her isosorbide mononitrate, cetirizine, furosemide, carvedilol, pantoprazole, potassium chloride, lisinopril, atorvastatin, OneTouch Delica Plus [id number], nitroGLYCERIN, POTASSIUM PO, buPROPion, cyanocobalamin, B-D DISP NEEDLE 25GX1", and BD Eclipse Syringe.   Meds ordered this encounter  Medications  . Semaglutide,0.25 or 0.5MG /DOS, (OZEMPIC, 0.25 OR 0.5 MG/DOSE,) 2 MG/1.5ML SOPN    Sig: Inject 0.5 mg into the skin once a week.    Dispense:  1.5 mL    Refill:  2    Order Specific Question:   Supervising Provider    Answer:   Crecencio Mc [[id number]]    Return precautions given.   Risks,  benefits, and alternatives of the medications and treatment plan prescribed today were discussed, and patient expressed understanding.   Education regarding symptom management and diagnosis given to patient on AVS.  Continue to follow with Burnard Hawthorne, FNP for routine health maintenance.   Salley Hews and I agreed with plan.   Mable Paris, FNP

## 2021-01-15 NOTE — Assessment & Plan Note (Addendum)
Fatigue improved. Continue b12 injections monthly at home.

## 2021-01-17 ENCOUNTER — Ambulatory Visit (INDEPENDENT_AMBULATORY_CARE_PROVIDER_SITE_OTHER): Payer: BC Managed Care – PPO | Admitting: Primary Care

## 2021-01-17 ENCOUNTER — Other Ambulatory Visit: Payer: Self-pay

## 2021-01-17 ENCOUNTER — Encounter: Payer: Self-pay | Admitting: Primary Care

## 2021-01-17 VITALS — BP 124/74 | HR 99 | Temp 97.1°F | Ht 67.5 in | Wt 185.6 lb

## 2021-01-17 DIAGNOSIS — R0683 Snoring: Secondary | ICD-10-CM | POA: Insufficient documentation

## 2021-01-17 NOTE — Progress Notes (Signed)
@Patient  ID: Katrina Kelly, female    DOB: Jun 04, 1961, 60 y.o.   MRN: [id number]  Chief Complaint  Patient presents with  . sleep consult    Per Mable Paris, NP--prior sleep study 30y ago. C/o daytime sleepiness and loud snoring.     Referring provider: Burnard Hawthorne, FNP  HPI: 60 year old female, former smoker. PMH significant for HTN, GERD, type 2 diabetes, fatigue, skin cancer, hyperlipidemia.   01/17/2021 Patient presents today for sleep consult. Referred by her PCP d/t fatigue symptoms. She also reports loud snoring. Her fatigue has improved some since starting Vit B12 injections. She has a history of nonischemic cardiomyopathy, which could be contributing to her fatigue. She is on Ozempic for weight loss/DM. She has lost 20 lbs in the last two years. She goes to bed between 10 pm -12am. It takes her 5 mins to fall asleep. She wakes up on average twice during the night. She gets out of bed at 6:30am. No symptoms of sleep walking/talking, sleep paralysis, cataplexy, narcolepsy.  Sleep Questionnaire Symptoms: Loud snoring, restless sleep Previous sleep study: 30 years ago Typical bedtime: 10 pm -12 am Length of time to fall asleep: 5 mins Nocturnal awakenings: 2 Out of bed in the morning: 6:30am Epworth Score: 12/34  Allergies  Allergen Reactions  . Metformin And Related Diarrhea  . Lactose Intolerance (Gi)     GI symptoms    Immunization History  Administered Date(s) Administered  . Influenza,inj,Quad PF,6+ Mos 08/13/2016, 08/25/2017, 08/17/2018  . Pneumococcal Polysaccharide-23 02/24/2017  . Tdap 07/16/2010    Past Medical History:  Diagnosis Date  . Allergy   . Anxiety   . Basal cell carcinoma    skin   . Cardiomyopathy ([place])    EF 35% per pt  . CHF (congestive heart failure) ([place])   . Diabetes type 2, uncontrolled ([place])   . Heartburn   . Hypertension     Tobacco History: Social History   Tobacco Use  Smoking Status Former Smoker  . Packs/day:  0.25  . Years: 5.00  . Pack years: 1.25  . Types: Cigarettes  . Quit date: 40  . Years since quitting: 36.1  Smokeless Tobacco Never Used   Counseling given: Not Answered   Outpatient Medications Prior to Visit  Medication Sig Dispense Refill  . atorvastatin (LIPITOR) 10 MG tablet Take 1 tablet (10 mg total) by mouth daily. 90 tablet 1  . buPROPion (WELLBUTRIN XL) 150 MG 24 hr tablet Take 1 tablet (150 mg total) by mouth daily with breakfast. 90 tablet 3  . carvedilol (COREG) 3.125 MG tablet Take 1 tablet (3.125 mg total) by mouth 2 (two) times daily with a meal. 60 tablet 3  . cetirizine (ZYRTEC) 10 MG tablet Take 10 mg by mouth daily.    . cyanocobalamin (,VITAMIN B-12,) 1000 MCG/ML injection Inject 23mL into skin SQ once a week for 4 weeks. 4 mL 0  . furosemide (LASIX) 40 MG tablet Take 40 mg by mouth daily as needed for edema.     . isosorbide mononitrate (IMDUR) 30 MG 24 hr tablet Take 30 mg by mouth at bedtime.     . Lancets (ONETOUCH DELICA PLUS [id number]) MISC USE AS DIRECTED 100 each 0  . lisinopril (ZESTRIL) 5 MG tablet TAKE 1 TABLET(5 MG) BY MOUTH DAILY 90 tablet 1  . NEEDLE, DISP, 25 G (B-D DISP NEEDLE 25GX1") 25G X 1" MISC Used to give B-12 injections. 10 each 0  . nitroGLYCERIN (NITROSTAT) 0.4  MG SL tablet     . pantoprazole (PROTONIX) 40 MG tablet Take 1 tablet (40 mg total) by mouth daily as needed. TAKE 1 TABLET(40 MG) BY MOUTH DAILY 90 tablet 1  . potassium chloride (KLOR-CON) 10 MEQ tablet Take 1 tablet (10 mEq total) by mouth daily as needed (swelling). 30 tablet 1  . POTASSIUM PO Take 1 tablet by mouth daily.    . Semaglutide,0.25 or 0.5MG /DOS, (OZEMPIC, 0.25 OR 0.5 MG/DOSE,) 2 MG/1.5ML SOPN Inject 0.5 mg into the skin once a week. 1.5 mL 2  . SYRINGE/NEEDLE, DISP, 1 ML (BD ECLIPSE SYRINGE) 25G X 5/8" 1 ML MISC Used to give B-12 injections. 50 each 0   No facility-administered medications prior to visit.   Review of Systems  Review of Systems  Constitutional:  Positive for fatigue.  Respiratory: Negative.   Psychiatric/Behavioral: Positive for sleep disturbance.   Physical Exam  BP 124/74 (BP Location: Left Arm, Cuff Size: Normal)   Pulse 99   Temp (!) 97.1 F (36.2 C) (Temporal)   Ht 5' 7.5" (1.715 m)   Wt 185 lb 9.6 oz (84.2 kg)   SpO2 97%   BMI 28.64 kg/m  Physical Exam Constitutional:      Appearance: Normal appearance.  HENT:     Head: Normocephalic and atraumatic.     Mouth/Throat:     Mouth: Mucous membranes are moist.     Pharynx: Oropharynx is clear.     Comments: Mallampati class II; Absent tonsils and uvula  Cardiovascular:     Rate and Rhythm: Normal rate and regular rhythm.  Pulmonary:     Effort: Pulmonary effort is normal.     Breath sounds: Normal breath sounds.  Musculoskeletal:        General: Normal range of motion.  Skin:    General: Skin is warm and dry.  Neurological:     General: No focal deficit present.     Mental Status: She is alert and oriented to person, place, and time. Mental status is at baseline.  Psychiatric:        Mood and Affect: Mood normal.        Behavior: Behavior normal.        Thought Content: Thought content normal.        Judgment: Judgment normal.      Lab Results:  CBC    Component Value Date/Time   WBC 6.3 01/15/2021 0912   RBC 4.43 01/15/2021 0912   HGB 13.0 01/15/2021 0912   HCT 40.2 01/15/2021 0912   PLT 240.0 01/15/2021 0912   MCV 90.8 01/15/2021 0912   MCHC 32.4 01/15/2021 0912   RDW 13.6 01/15/2021 0912   LYMPHSABS 2.3 01/15/2021 0912   MONOABS 0.6 01/15/2021 0912   EOSABS 0.2 01/15/2021 0912   BASOSABS 0.1 01/15/2021 0912    BMET    Component Value Date/Time   NA 141 12/12/2020 0803   K 3.8 12/12/2020 0803   CL 107 12/12/2020 0803   CO2 26 12/12/2020 0803   GLUCOSE 133 (H) 12/12/2020 0803   BUN 9 12/12/2020 0803   CREATININE 0.89 12/12/2020 0803   CREATININE 0.80 09/05/2020 1632   CALCIUM 8.8 12/12/2020 0803    BNP No results found for:  BNP  ProBNP No results found for: PROBNP  Imaging: No results found.   Assessment & Plan:   Loud snoring - Patient reports symptoms of fatigue and loud snoring. Epworth 12/24.  - I have a moderate suspicion patient may have underlying  sleep apnea, needs home sleep study to evaluate  - Discussed with patient that untreated sleep apnea puts patient at a higher risk for cardiovascular disease, cardiac arrhythmias, pulmonary hypertension, diabetes, stroke and increase in daytime accidents. We reviewed treatment options including weight loss, side sleeping position, oral appliance, CPAP or referral to ENT for possible surgical interventions - Advised patient not to drive if experiencing excessive daytime fatigue or somnolence - FU in 4 weeks after sleep study to review result and treatment options   Martyn Ehrich, NP 01/17/2021

## 2021-01-17 NOTE — Assessment & Plan Note (Signed)
-   Patient reports symptoms of fatigue and loud snoring. Epworth 12/24.  - I have a moderate suspicion patient may have underlying sleep apnea, needs home sleep study to evaluate  - Discussed with patient that untreated sleep apnea puts patient at a higher risk for cardiovascular disease, cardiac arrhythmias, pulmonary hypertension, diabetes, stroke and increase in daytime accidents. We reviewed treatment options including weight loss, side sleeping position, oral appliance, CPAP or referral to ENT for possible surgical interventions - Advised patient not to drive if experiencing excessive daytime fatigue or somnolence - FU in 4 weeks after sleep study to review result and treatment options

## 2021-01-17 NOTE — Progress Notes (Signed)
Reviewed and agree with assessment/plan.   Lexy Meininger, MD  Pulmonary/Critical Care 01/17/2021, 1:08 PM Pager:  [phone]  

## 2021-01-17 NOTE — Patient Instructions (Addendum)
  Untreated sleep apnea puts you at high risk for cardiovascular disease, cardiac arrhythmias, pulmonary hypertension, stroke, diabetes, increased risk for daytime accidents   Treatment options for sleep apnea include weight loss, side sleeping position, oral appliance, CPAP, referral to ENT for possible surgical intervention   Do not drive if experiencing excessive daytime fatigue or somnolence   Orders: Home sleep study re: loud snoring    Follow-up: 4 weeks fu televisit (after sleep study)

## 2021-02-12 ENCOUNTER — Other Ambulatory Visit: Payer: Self-pay

## 2021-02-12 ENCOUNTER — Ambulatory Visit: Payer: BC Managed Care – PPO

## 2021-02-12 DIAGNOSIS — G4733 Obstructive sleep apnea (adult) (pediatric): Secondary | ICD-10-CM | POA: Diagnosis not present

## 2021-02-12 DIAGNOSIS — R0683 Snoring: Secondary | ICD-10-CM

## 2021-02-13 ENCOUNTER — Telehealth: Payer: Self-pay

## 2021-02-13 NOTE — Telephone Encounter (Signed)
Called patient to let her know we do not have her sleep results yet and we will be needing to move her appointment for tomorrow. Appointment moved to Friday the 18 th @ 4:30. Nothing else needed.

## 2021-02-14 ENCOUNTER — Ambulatory Visit: Payer: BC Managed Care – PPO | Admitting: Primary Care

## 2021-02-14 DIAGNOSIS — G4733 Obstructive sleep apnea (adult) (pediatric): Secondary | ICD-10-CM | POA: Diagnosis not present

## 2021-02-20 ENCOUNTER — Ambulatory Visit: Payer: BC Managed Care – PPO | Admitting: Primary Care

## 2021-02-20 DIAGNOSIS — L821 Other seborrheic keratosis: Secondary | ICD-10-CM | POA: Diagnosis not present

## 2021-02-20 DIAGNOSIS — C44519 Basal cell carcinoma of skin of other part of trunk: Secondary | ICD-10-CM | POA: Diagnosis not present

## 2021-02-20 DIAGNOSIS — Z85828 Personal history of other malignant neoplasm of skin: Secondary | ICD-10-CM | POA: Diagnosis not present

## 2021-03-02 ENCOUNTER — Encounter: Payer: Self-pay | Admitting: Family

## 2021-03-02 ENCOUNTER — Ambulatory Visit (INDEPENDENT_AMBULATORY_CARE_PROVIDER_SITE_OTHER): Payer: BC Managed Care – PPO | Admitting: Primary Care

## 2021-03-02 ENCOUNTER — Encounter: Payer: Self-pay | Admitting: Primary Care

## 2021-03-02 ENCOUNTER — Other Ambulatory Visit: Payer: Self-pay

## 2021-03-02 DIAGNOSIS — G4733 Obstructive sleep apnea (adult) (pediatric): Secondary | ICD-10-CM

## 2021-03-02 NOTE — Progress Notes (Signed)
Virtual Visit via Telephone Note  I connected with Katrina Kelly on 03/02/21 at  4:30 PM EDT by telephone and verified that I am speaking with the correct person using two identifiers.  Location: Patient: Home Provider: Office   I discussed the limitations, risks, security and privacy concerns of performing an evaluation and management service by telephone and the availability of in person appointments. I also discussed with the patient that there may be a patient responsible charge related to this service. The patient expressed understanding and agreed to proceed.   History of Present Illness: 60 year old female, former smoker. PMH significant for HTN, GERD, type 2 diabetes, fatigue, skin cancer, hyperlipidemia. Referred to [facility] for sleep consult on 01/17/21.  03/02/2021 Patient contacted today for 4-6 week follow-up to review sleep study.  Patient has symptoms of loud snoring and restless sleep. Epworth score was 12. Home sleep study on 02/12/21 showed severe obstructive sleep apnea, AHI 57/hr with SpO2 low 79%. We reviewed treatment options include weight loss, side sleeping position, oral appliance, CPAP therapy, referral to ENT for possible surgical options.  We also discussed risks of untreated sleep apnea including cardiac arrhythmias, stroke, diabetes, pulmonary hypertension and increased risk for daytime accidents.  Observations/Objective:   Able to speak in full sentences; no overt shortness of breath, wheezing or cough  Assessment and Plan:  Severe OSA: - HST 02/12/21 >> AHI 57/hr with SpO2 low 79% - Starting auto CPAP 5-20cm h20, mask of choice (resmed or luna machine ok) - Advised to wear 4-6 hours or longer each night, avoid driving if experiencing excessive daytime fatigue or somnolence  - Patient will call and schedule follow-up after receiving CPAP machine  Follow Up Instructions:  - 1-3 months after starting CPAP    I discussed the assessment and treatment  plan with the patient. The patient was provided an opportunity to ask questions and all were answered. The patient agreed with the plan and demonstrated an understanding of the instructions.   The patient was advised to call back or seek an in-person evaluation if the symptoms worsen or if the condition fails to improve as anticipated.  I provided 22 minutes of non-face-to-face time during this encounter.   Martyn Ehrich, NP

## 2021-03-02 NOTE — Patient Instructions (Signed)
Home sleep study showed severe obstructive sleep apnea, you had 57 events an hour.  Average oxygen level is 91% with O2 low of 79%  Recommend starting CPAP auto titrate 5 to 20 cm H2O  Please aim to wear CPAP for 4 to 6 hours or longer each night  Do not drive if experiencing excessive daytime fatigue or somnolence  Call office to scheduled visit 31-90 days after starting CPAP    CPAP and BPAP Information CPAP and BPAP are methods that use air pressure to keep your airways open and to help you breathe well. CPAP and BPAP use different amounts of pressure. Your health care provider will tell you whether CPAP or BPAP would be more helpful for you.  CPAP stands for "continuous positive airway pressure." With CPAP, the amount of pressure stays the same while you breathe in and out.  BPAP stands for "bi-level positive airway pressure." With BPAP, the amount of pressure will be higher when you breathe in (inhale) and lower when you breathe out(exhale). This allows you to take larger breaths. CPAP or BPAP may be used in the hospital, or your health care provider may want you to use it at home. You may need to have a sleep study before your health care provider can order a machine for you to use at home. Why are CPAP and BPAP treatments used? CPAP or BPAP can be helpful if you have:  Sleep apnea.  Chronic obstructive pulmonary disease (COPD).  Heart failure.  Medical conditions that cause muscle weakness, including muscular dystrophy or amyotrophic lateral sclerosis (ALS).  Other problems that cause breathing to be shallow, weak, abnormal, or difficult. CPAP and BPAP are most commonly used for obstructive sleep apnea (OSA) to keep the airways from collapsing when the muscles relax during sleep. How is CPAP or BPAP administered? Both CPAP and BPAP are provided by a small machine with a flexible plastic tube that attaches to a plastic mask that you wear. Air is blown through the mask into your  nose or mouth. The amount of pressure that is used to blow the air can be adjusted on the machine. Your health care provider will set the pressure setting and help you find the best mask for you. When should CPAP or BPAP be used? In most cases, the mask only needs to be worn during sleep. Generally, the mask needs to be worn throughout the night and during any daytime naps. People with certain medical conditions may also need to wear the mask at other times when they are awake. Follow instructions from your health care provider about when to use the machine. What are some tips for using the mask?  Because the mask needs to be snug, some people feel trapped or closed-in (claustrophobic) when first using the mask. If you feel this way, you may need to get used to the mask. One way to do this is to hold the mask loosely over your nose or mouth and then gradually apply the mask more snugly. You can also gradually increase the amount of time that you use the mask.  Masks are available in various types and sizes. If your mask does not fit well, talk with your health care provider about getting a different one. Some common types of masks include: ? Full face masks, which fit over the mouth and nose. ? Nasal masks, which fit over the nose. ? Nasal pillow or prong masks, which fit into the nostrils.  If you are using a  mask that fits over your nose and you tend to breathe through your mouth, a chin strap may be applied to help keep your mouth closed.  Some CPAP and BPAP machines have alarms that may sound if the mask comes off or develops a leak.  If you have trouble with the mask, it is very important that you talk with your health care provider about finding a way to make the mask easier to tolerate. Do not stop using the mask. There could be a negative impact to your health if you stop using the mask.   What are some tips for using the machine?  Place your CPAP or BPAP machine on a secure table or stand  near an electrical outlet.  Know where the on/off switch is on the machine.  Follow instructions from your health care provider about how to set the pressure on your machine and when you should use it.  Do not eat or drink while the CPAP or BPAP machine is on. Food or fluids could get pushed into your lungs by the pressure of the CPAP or BPAP.  For home use, CPAP and BPAP machines can be rented or purchased through home health care companies. Many different brands of machines are available. Renting a machine before purchasing may help you find out which particular machine works well for you. Your insurance may also decide which machine you may get.  Keep the CPAP or BPAP machine and attachments clean. Ask your health care provider for specific instructions. Follow these instructions at home:  Do not use any products that contain nicotine or tobacco, such as cigarettes, e-cigarettes, and chewing tobacco. If you need help quitting, ask your health care provider.  Keep all follow-up visits as told by your health care provider. This is important. Contact a health care provider if:  You have redness or pressure sores on your head, face, mouth, or nose from the mask or head gear.  You have trouble using the CPAP or BPAP machine.  You cannot tolerate wearing the CPAP or BPAP mask.  Someone tells you that you snore even when wearing your CPAP or BPAP. Get help right away if:  You have trouble breathing.  You feel confused. Summary  CPAP and BPAP are methods that use air pressure to keep your airways open and to help you breathe well.  You may need to have a sleep study before your health care provider can order a machine for home use.  If you have trouble with the mask, it is very important that you talk with your health care provider about finding a way to make the mask easier to tolerate. Do not stop using the mask. There could be a negative impact to your health if you stop using the  mask.  Follow instructions from your health care provider about when to use the machine. This information is not intended to replace advice given to you by your health care provider. Make sure you discuss any questions you have with your health care provider. Document Revised: 12/24/2019 Document Reviewed: 12/27/2019 Elsevier Patient Education  2021 [employer].

## 2021-03-02 NOTE — Progress Notes (Signed)
Reviewed and agree with assessment/plan.   Chesley Mires, MD [facility] Pulmonary/Critical Care 03/02/2021, 5:44 PM Pager:  [phone]

## 2021-03-05 ENCOUNTER — Other Ambulatory Visit: Payer: Self-pay

## 2021-03-05 ENCOUNTER — Other Ambulatory Visit: Payer: Self-pay | Admitting: Family

## 2021-03-05 MED ORDER — "BD DISP NEEDLE 25G X 1"" MISC": 0 refills | Status: DC

## 2021-03-05 MED ORDER — BD ECLIPSE SYRINGE 25G X 5/8" 1 ML MISC
0 refills | Status: AC
Start: 2021-03-05 — End: ?

## 2021-03-05 MED ORDER — CYANOCOBALAMIN 1000 MCG/ML IJ SOLN: INTRAMUSCULAR | 0 refills | Status: DC

## 2021-03-22 ENCOUNTER — Encounter: Payer: Self-pay | Admitting: Internal Medicine

## 2021-03-22 ENCOUNTER — Telehealth (INDEPENDENT_AMBULATORY_CARE_PROVIDER_SITE_OTHER): Payer: BC Managed Care – PPO | Admitting: Internal Medicine

## 2021-03-22 VITALS — Wt 190.0 lb

## 2021-03-22 DIAGNOSIS — J0191 Acute recurrent sinusitis, unspecified: Secondary | ICD-10-CM

## 2021-03-22 MED ORDER — AMOXICILLIN-POT CLAVULANATE 875-125 MG PO TABS: 1.0000 | ORAL_TABLET | Freq: Two times a day (BID) | ORAL | 0 refills | Status: DC

## 2021-03-22 NOTE — Progress Notes (Signed)
Telephone Note  I connected with Katrina Kelly  on 03/22/21 at  2:00 PM EDT by telephone  and verified that I am speaking with the correct person using two identifiers.  Location patient: home, [place] Location provider:work or home office Persons participating in the virtual visit: patient, provider  I discussed the limitations of evaluation and management by telemedicine and the availability of in person appointments. The patient expressed understanding and agreed to proceed.   HPI: 1-2 x per year x 30 years had recurrent sinus issues s/p sinus surgery 30 years Dr. Delma Freeze in [place] and h/o allergies shots x 7 years and stopped in 2017. She c/o 1 week of pain in her teeth and overall jaw pain x 3 weeks denies fever/chills, also has right ear right jaw pain no exposure to covid she knows declines covid testing though she is not wearing a mask in public   She has tried zyrtec and nasal saline and has osa with pending CPAP   -COVID-19 vaccine status: no covid vaccine on file  ROS: See pertinent positives and negatives per HPI.  Past Medical History:  Diagnosis Date  . Allergy   . Anxiety   . Basal cell carcinoma    skin   . Cardiomyopathy ([place])    EF 35% per pt  . CHF (congestive heart failure) ([place])   . Diabetes type 2, uncontrolled ([place])   . Heartburn   . Hypertension     Past Surgical History:  Procedure Laterality Date  . CHOLECYSTECTOMY  2008  . COLONOSCOPY WITH PROPOFOL N/A 01/03/2017   Procedure: COLONOSCOPY WITH PROPOFOL;  Surgeon: Mauri Pole, MD;  Location: [facility] ENDOSCOPY;  Service: Endoscopy;  Laterality: N/A;  . FOOT SURGERY  1982   "benign tumor bottom of right foot"  . NASAL SEPTUM SURGERY  2000   Dr. [facility]ze [facility]  . TONSILLECTOMY  2000  . uterine ablation    . UVULECTOMY  2000     Current Outpatient Medications:  .  amoxicillin-clavulanate (AUGMENTIN) 875-125 MG tablet, Take 1 tablet by mouth 2 (two) times daily. With food, Disp: 14 tablet, Rfl:  0 .  atorvastatin (LIPITOR) 10 MG tablet, Take 1 tablet (10 mg total) by mouth daily., Disp: 90 tablet, Rfl: 1 .  buPROPion (WELLBUTRIN XL) 150 MG 24 hr tablet, Take 1 tablet (150 mg total) by mouth daily with breakfast., Disp: 90 tablet, Rfl: 3 .  carvedilol (COREG) 3.125 MG tablet, Take 1 tablet (3.125 mg total) by mouth 2 (two) times daily with a meal., Disp: 60 tablet, Rfl: 3 .  cetirizine (ZYRTEC) 10 MG tablet, Take 10 mg by mouth daily., Disp: , Rfl:  .  cyanocobalamin (,VITAMIN B-12,) 1000 MCG/ML injection, ADMINISTER 1 ML UNDER THE SKIN ONCE A WEEK FOR 4 WEEKS, Disp: 12 mL, Rfl: 1 .  furosemide (LASIX) 40 MG tablet, Take 40 mg by mouth daily as needed for edema. , Disp: , Rfl:  .  isosorbide mononitrate (IMDUR) 30 MG 24 hr tablet, Take 30 mg by mouth at bedtime. , Disp: , Rfl:  .  Lancets (ONETOUCH DELICA PLUS [id number]) MISC, USE AS DIRECTED, Disp: 100 each, Rfl: 0 .  lisinopril (ZESTRIL) 5 MG tablet, TAKE 1 TABLET(5 MG) BY MOUTH DAILY, Disp: 90 tablet, Rfl: 1 .  NEEDLE, DISP, 25 G (B-D DISP NEEDLE 25GX1") 25G X 1" MISC, Used to give B-12 injections., Disp: 10 each, Rfl: 0 .  nitroGLYCERIN (NITROSTAT) 0.4 MG SL tablet, , Disp: , Rfl:  .  pantoprazole (PROTONIX) 40 MG tablet, Take 1 tablet (40 mg total) by mouth daily as needed. TAKE 1 TABLET(40 MG) BY MOUTH DAILY, Disp: 90 tablet, Rfl: 1 .  potassium chloride (KLOR-CON) 10 MEQ tablet, Take 1 tablet (10 mEq total) by mouth daily as needed (swelling)., Disp: 30 tablet, Rfl: 1 .  POTASSIUM PO, Take 1 tablet by mouth daily., Disp: , Rfl:  .  Semaglutide,0.25 or 0.5MG /DOS, (OZEMPIC, 0.25 OR 0.5 MG/DOSE,) 2 MG/1.5ML SOPN, Inject 0.5 mg into the skin once a week., Disp: 1.5 mL, Rfl: 2 .  SYRINGE/NEEDLE, DISP, 1 ML (BD ECLIPSE SYRINGE) 25G X 5/8" 1 ML MISC, Used to give B-12 injections., Disp: 50 each, Rfl: 0  EXAM:  VITALS per patient if applicable:  GENERAL: alert, oriented, appears well and in no acute distress  PSYCH/NEURO: pleasant and  cooperative, no obvious depression or anxiety, speech and thought processing grossly intact  ASSESSMENT AND PLAN:  Discussed the following assessment and plan:  Acute recurrent sinusitis, unspecified location - Plan: amoxicillin-clavulanate (AUGMENTIN) 875-125 MG tablet bid x 7 days  Ns, flonase, zyrtec  -we discussed possible serious and likely etiologies, options for evaluation and workup, limitations of telemedicine visit vs in person visit, treatment, treatment risks and precautions.  I discussed the assessment and treatment plan with the patient. The patient was provided an opportunity to ask questions and all were answered. The patient agreed with the plan and demonstrated an understanding of the instructions.    Time spent 20 min Delorise Jackson, MD

## 2021-04-16 ENCOUNTER — Ambulatory Visit: Payer: BC Managed Care – PPO | Admitting: Family

## 2021-05-01 DIAGNOSIS — G4733 Obstructive sleep apnea (adult) (pediatric): Secondary | ICD-10-CM | POA: Diagnosis not present

## 2021-05-03 ENCOUNTER — Other Ambulatory Visit: Payer: Self-pay | Admitting: Family

## 2021-05-18 ENCOUNTER — Encounter: Payer: Self-pay | Admitting: Family

## 2021-05-18 ENCOUNTER — Ambulatory Visit: Payer: BC Managed Care – PPO | Admitting: Family

## 2021-05-18 ENCOUNTER — Other Ambulatory Visit: Payer: Self-pay

## 2021-05-18 VITALS — BP 110/62 | HR 92 | Temp 97.8°F | Ht 67.5 in | Wt 186.8 lb

## 2021-05-18 DIAGNOSIS — I10 Essential (primary) hypertension: Secondary | ICD-10-CM

## 2021-05-18 DIAGNOSIS — Z23 Encounter for immunization: Secondary | ICD-10-CM

## 2021-05-18 DIAGNOSIS — I428 Other cardiomyopathies: Secondary | ICD-10-CM

## 2021-05-18 DIAGNOSIS — G4733 Obstructive sleep apnea (adult) (pediatric): Secondary | ICD-10-CM

## 2021-05-18 DIAGNOSIS — E118 Type 2 diabetes mellitus with unspecified complications: Secondary | ICD-10-CM | POA: Diagnosis not present

## 2021-05-18 DIAGNOSIS — E785 Hyperlipidemia, unspecified: Secondary | ICD-10-CM

## 2021-05-18 LAB — POCT GLYCOSYLATED HEMOGLOBIN (HGB A1C): Hemoglobin A1C: 5.8 % — AB (ref 4.0–5.6)

## 2021-05-18 LAB — B12 AND FOLATE PANEL
Folate: 11.2 ng/mL (ref >5.9)
Vitamin B-12: 405 pg/mL (ref 211–911)

## 2021-05-18 MED ORDER — CYANOCOBALAMIN 1000 MCG/ML IJ SOLN: INTRAMUSCULAR | 4 refills | Status: DC

## 2021-05-18 NOTE — Assessment & Plan Note (Signed)
Anticipate controlled. Pending lipid panel. Continue lipitor 10mg 

## 2021-05-18 NOTE — Assessment & Plan Note (Signed)
Controlled. Continue coreg 3.125mg  BID, lisinopril 5mg 

## 2021-05-18 NOTE — Assessment & Plan Note (Signed)
Symptoms improvement with compliance with cpap

## 2021-05-18 NOTE — Assessment & Plan Note (Signed)
Excellent control.  Lab Results  Component Value Date   HGBA1C 5.8 (A) 47/20/7218   Complicated by HLD. Continue ozempic 0.5mg 

## 2021-05-18 NOTE — Assessment & Plan Note (Signed)
Asymptomatic. Continue imdur 30mg , lasix 40mg  prn edema, potassium chloride 10meq PRN with lasix.

## 2021-05-18 NOTE — Progress Notes (Signed)
Subjective:    Patient ID: Katrina Kelly, female    DOB: 10/31/1961, 60 y.o.   MRN: [id number]  CC: Katrina Kelly is a 60 y.o. female who presents today for follow up.   HPI: Feels well today No complaints  HTN- compliant with coreg 3.125mg  BID, lisinopril 5mg    DM- Compliant with ozempic 0.25  b12 deficiency- compliant with injections  NICM- compliant imdur 30mg  ; compliant with lasix 40mg  prn edema, potassium chloride 41meq PRN with lasix. No leg swelling today. No cp, sob.    OSA- wearing Cpap and feels fatigue has improved.    HLD- compliant with lipitor   HISTORY:  Past Medical History:  Diagnosis Date  . Allergy   . Anxiety   . Basal cell carcinoma    skin   . Cardiomyopathy ([place])    EF 35% per pt  . CHF (congestive heart failure) ([place])   . COVID-19    2021, 12/2020   . Diabetes type 2, uncontrolled ([place])   . Heartburn   . Hypertension    Past Surgical History:  Procedure Laterality Date  . CHOLECYSTECTOMY  2008  . COLONOSCOPY WITH PROPOFOL N/A 01/03/2017   Procedure: COLONOSCOPY WITH PROPOFOL;  Surgeon: Mauri Pole, MD;  Location: [facility];  Service: Endoscopy;  Laterality: N/A;  . FOOT SURGERY  1982   "benign tumor bottom of right foot"  . NASAL SEPTUM SURGERY  2000   Dr. Delma Freeze [facility]NT [facility]  . TONSILLECTOMY  2000  . uterine ablation    . UVULECTOMY  2000   Family History  Problem Relation Age of Onset  . Diabetes Mother   . Hypertension Mother   . Cancer Father        lung  . Emphysema Father   . Diabetes Sister   . Diabetes Brother   . Leukemia Brother   . Stroke Brother   . Heart attack Maternal Uncle   . Breast cancer Paternal Aunt 71  . Lymphoma Paternal Uncle   . Polycystic ovary syndrome Daughter   . Hypertension Daughter   . Diabetes Mellitus II Maternal Grandmother   . Colon cancer Neg Hx     Allergies: Keflex [cephalexin], Metformin and related, and Lactose intolerance (gi) Current Outpatient Medications on File  Prior to Visit  Medication Sig Dispense Refill  . atorvastatin (LIPITOR) 10 MG tablet Take 1 tablet (10 mg total) by mouth daily. 90 tablet 1  . buPROPion (WELLBUTRIN XL) 150 MG 24 hr tablet Take 1 tablet (150 mg total) by mouth daily with breakfast. 90 tablet 3  . carvedilol (COREG) 3.125 MG tablet Take 1 tablet (3.125 mg total) by mouth 2 (two) times daily with a meal. 60 tablet 3  . cetirizine (ZYRTEC) 10 MG tablet Take 10 mg by mouth daily.    . furosemide (LASIX) 40 MG tablet Take 40 mg by mouth daily as needed for edema.     . isosorbide mononitrate (IMDUR) 30 MG 24 hr tablet Take 30 mg by mouth at bedtime.     . Lancets (ONETOUCH DELICA PLUS [id number]) MISC USE AS DIRECTED 100 each 0  . lisinopril (ZESTRIL) 5 MG tablet TAKE 1 TABLET(5 MG) BY MOUTH DAILY 90 tablet 1  . NEEDLE, DISP, 25 G (B-D DISP NEEDLE 25GX1") 25G X 1" MISC Used to give B-12 injections. 10 each 0  . nitroGLYCERIN (NITROSTAT) 0.4 MG SL tablet     . pantoprazole (PROTONIX) 40 MG tablet Take 1 tablet (40 mg total) by  mouth daily as needed. TAKE 1 TABLET(40 MG) BY MOUTH DAILY 90 tablet 1  . potassium chloride (KLOR-CON) 10 MEQ tablet Take 1 tablet (10 mEq total) by mouth daily as needed (swelling). 30 tablet 1  . Semaglutide,0.25 or 0.5MG /DOS, (OZEMPIC, 0.25 OR 0.5 MG/DOSE,) 2 MG/1.5ML SOPN Inject 0.5 mg into the skin once a week. 1.5 mL 2  . SYRINGE/NEEDLE, DISP, 1 ML (BD ECLIPSE SYRINGE) 25G X 5/8" 1 ML MISC Used to give B-12 injections. 50 each 0  . amoxicillin-clavulanate (AUGMENTIN) 875-125 MG tablet Take 1 tablet by mouth 2 (two) times daily. With food (Patient not taking: Reported on 05/18/2021) 14 tablet 0  . POTASSIUM PO Take 1 tablet by mouth daily. (Patient not taking: Reported on 05/18/2021)     No current facility-administered medications on file prior to visit.    Social History   Tobacco Use  . Smoking status: Former Smoker    Packs/day: 0.25    Years: 5.00    Pack years: 1.25    Types: Cigarettes    Quit  date: 1986    Years since quitting: 36.4  . Smokeless tobacco: Never Used  Vaping Use  . Vaping Use: Never used  Substance Use Topics  . Alcohol use: Yes    Alcohol/week: 1.0 standard drink    Types: 1 Standard drinks or equivalent per week    Comment: twice monthly  . Drug use: No    Review of Systems  Constitutional: Negative for chills and fever.  Respiratory: Negative for cough.   Cardiovascular: Negative for chest pain, palpitations and leg swelling.  Gastrointestinal: Negative for nausea and vomiting.      Objective:    BP 110/62 (BP Location: Left Arm, Patient Position: Sitting, Cuff Size: Large)   Pulse 92   Temp 97.8 F (36.6 C) (Oral)   Ht 5' 7.5" (1.715 m)   Wt 186 lb 12.8 oz (84.7 kg)   SpO2 97%   BMI 28.83 kg/m  BP Readings from Last 3 Encounters:  05/18/21 110/62  01/17/21 124/74  01/15/21 126/74   Wt Readings from Last 3 Encounters:  05/18/21 186 lb 12.8 oz (84.7 kg)  03/22/21 190 lb (86.2 kg)  01/17/21 185 lb 9.6 oz (84.2 kg)    Physical Exam Vitals reviewed.  Constitutional:      Appearance: She is well-developed.  Eyes:     Conjunctiva/sclera: Conjunctivae normal.  Cardiovascular:     Rate and Rhythm: Normal rate and regular rhythm.     Pulses: Normal pulses.     Heart sounds: Normal heart sounds.  Pulmonary:     Effort: Pulmonary effort is normal.     Breath sounds: Normal breath sounds. No wheezing, rhonchi or rales.  Skin:    General: Skin is warm and dry.  Neurological:     Mental Status: She is alert.  Psychiatric:        Speech: Speech normal.        Behavior: Behavior normal.        Thought Content: Thought content normal.        Assessment & Plan:   Problem List Items Addressed This Visit      Cardiovascular and Mediastinum   Hypertension    Controlled. Continue coreg 3.125mg  BID, lisinopril 5mg         NICM (nonischemic cardiomyopathy) ([place])     Asymptomatic. Continue imdur 30mg , lasix 40mg  prn edema, potassium  chloride 11meq PRN with lasix.        Respiratory  OSA (obstructive sleep apnea)    Symptoms improvement with compliance with cpap        Endocrine   Controlled diabetes mellitus type 2 with complications ([place]) - Primary    Excellent control.  Lab Results  Component Value Date   HGBA1C 5.8 (A) 19/16/6060   Complicated by HLD. Continue ozempic 0.5mg       Relevant Medications   cyanocobalamin (,VITAMIN B-12,) 1000 MCG/ML injection   Other Relevant Orders   POCT HgB A1C (Completed)   B12 and Folate Panel   Comprehensive metabolic panel   Lipid panel   Microalbumin / creatinine urine ratio     Other   Hyperlipidemia    Anticipate controlled. Pending lipid panel. Continue lipitor 10mg           I have discontinued Edla Sanseverino's cyanocobalamin. I am also having her start on cyanocobalamin. Additionally, I am having her maintain her isosorbide mononitrate, cetirizine, furosemide, carvedilol, pantoprazole, potassium chloride, atorvastatin, OneTouch Delica Plus [id number], nitroGLYCERIN, POTASSIUM PO, buPROPion, Ozempic (0.25 or 0.5 MG/DOSE), B-D DISP NEEDLE 25GX1", BD Eclipse Syringe, amoxicillin-clavulanate, and lisinopril.   Meds ordered this encounter  Medications  . cyanocobalamin (,VITAMIN B-12,) 1000 MCG/ML injection    Sig: 1000 mcg (1 mL) intramuscular injection in the thigh ( vastus lateralis) once per month.    Dispense:  3 mL    Refill:  4    Order Specific Question:   Supervising Provider    Answer:   Crecencio Mc [[id number]]    Return precautions given.   Risks, benefits, and alternatives of the medications and treatment plan prescribed today were discussed, and patient expressed understanding.   Education regarding symptom management and diagnosis given to patient on AVS.  Continue to follow with Burnard Hawthorne, FNP for routine health maintenance.   Salley Hews and I agreed with plan.   Mable Paris, FNP

## 2021-05-18 NOTE — Patient Instructions (Signed)
Nice to see you!   

## 2021-05-18 NOTE — Addendum Note (Signed)
Addended by: Cheri Rous E on: 05/18/2021 09:02 AM   Modules accepted: Orders

## 2021-05-21 LAB — COMPREHENSIVE METABOLIC PANEL
ALT: 17 U/L (ref 0–35)
AST: 16 U/L (ref 0–37)
Albumin: 4 g/dL (ref 3.5–5.2)
Alkaline Phosphatase: 74 U/L (ref 39–117)
BUN: 10 mg/dL (ref 6–23)
CO2: 23 mEq/L (ref 19–32)
Calcium: 9.4 mg/dL (ref 8.4–10.5)
Chloride: 104 mEq/L (ref 96–112)
Creatinine, Ser: 0.89 mg/dL (ref 0.40–1.20)
GFR: 70.81 mL/min (ref >60.00)
Glucose, Bld: 121 mg/dL — ABNORMAL HIGH (ref 70–99)
Potassium: 4 mEq/L (ref 3.5–5.1)
Sodium: 138 mEq/L (ref 135–145)
Total Bilirubin: 0.4 mg/dL (ref 0.2–1.2)
Total Protein: 7.1 g/dL (ref 6.0–8.3)

## 2021-05-21 LAB — MICROALBUMIN / CREATININE URINE RATIO
Creatinine,U: 199.4 mg/dL
Microalb Creat Ratio: 1 mg/g (ref 0.0–30.0)
Microalb, Ur: 2 mg/dL — ABNORMAL HIGH (ref 0.0–1.9)

## 2021-05-21 LAB — LIPID PANEL
Cholesterol: 120 mg/dL (ref 0–200)
HDL: 41.9 mg/dL (ref >39.00)
LDL Cholesterol: 51 mg/dL (ref 0–99)
NonHDL: 78.52
Total CHOL/HDL Ratio: 3
Triglycerides: 137 mg/dL (ref 0.0–149.0)
VLDL: 27.4 mg/dL (ref 0.0–40.0)

## 2021-05-25 ENCOUNTER — Other Ambulatory Visit: Payer: Self-pay

## 2021-05-25 DIAGNOSIS — E118 Type 2 diabetes mellitus with unspecified complications: Secondary | ICD-10-CM

## 2021-05-25 DIAGNOSIS — I10 Essential (primary) hypertension: Secondary | ICD-10-CM

## 2021-05-25 MED ORDER — LISINOPRIL 5 MG PO TABS: ORAL_TABLET | ORAL | 1 refills | Status: DC

## 2021-05-25 MED ORDER — LISINOPRIL 2.5 MG PO TABS: 2.5000 mg | ORAL_TABLET | Freq: Every day | ORAL | 1 refills | Status: DC

## 2021-06-01 ENCOUNTER — Other Ambulatory Visit: Payer: Self-pay

## 2021-06-01 ENCOUNTER — Telehealth: Payer: BC Managed Care – PPO | Admitting: Family Medicine

## 2021-06-01 ENCOUNTER — Other Ambulatory Visit: Payer: BC Managed Care – PPO

## 2021-06-01 DIAGNOSIS — J0101 Acute recurrent maxillary sinusitis: Secondary | ICD-10-CM | POA: Diagnosis not present

## 2021-06-01 DIAGNOSIS — J329 Chronic sinusitis, unspecified: Secondary | ICD-10-CM | POA: Insufficient documentation

## 2021-06-01 DIAGNOSIS — G4733 Obstructive sleep apnea (adult) (pediatric): Secondary | ICD-10-CM | POA: Diagnosis not present

## 2021-06-01 MED ORDER — DOXYCYCLINE HYCLATE 100 MG PO TABS: 100.0000 mg | ORAL_TABLET | Freq: Two times a day (BID) | ORAL | 0 refills | Status: DC

## 2021-06-01 NOTE — Assessment & Plan Note (Signed)
The patient has symptoms likely related to sinusitis.  She had incomplete treatment 2 months ago and reports her symptoms never completely resolved though they did improve.  Discussed treatment with doxycycline.  Discussed risk of GI upset as well as skin sensitivity to the sun.  I encouraged her to wear sunscreen or long sleeves and a hat if she has to go outside while on this medication.  Discussed COVID testing though the patient deferred in office testing given her history of chronic sinus issues.  I did encourage her to do a home test.  She will let us know if she is not improving.  She will seek medical attention for elevated fevers and shortness of breath.

## 2021-06-01 NOTE — Progress Notes (Signed)
Virtual Visit via telephone Note  This visit type was conducted due to national recommendations for restrictions regarding the COVID-19 pandemic (e.g. social distancing).  This format is felt to be most appropriate for this patient at this time.  All issues noted in this document were discussed and addressed.  No physical exam was performed (except for noted visual exam findings with Video Visits).   I connected with Katrina Kelly today at 10:00 AM EDT by telephone and verified that I am speaking with the correct person using two identifiers. Location patient: work Location provider: work Persons participating in the virtual visit: patient, provider  I discussed the limitations, risks, security and privacy concerns of performing an evaluation and management service by telephone and the availability of in person appointments. I also discussed with the patient that there may be a patient responsible charge related to this service. The patient expressed understanding and agreed to proceed.  Interactive audio and video telecommunications were attempted between this provider and patient, however failed, due to patient having technical difficulties OR patient did not have access to video capability.  We continued and completed visit with audio only.   Reason for visit: same day visit  HPI: Sinusitis: Patient reports symptoms have been going on for a while.  She was treated for a sinus infection back in April and she did not complete the antibiotic course as it upset her stomach.  She feels as though things got better though she did not get rid of her symptoms completely.  She has had sinus congestion and discomfort with some mild sore throat and postnasal drip.  Minimal cough from the postnasal drip.  She notes possibly having had a fever that was minimal as she had sweating 1 night though otherwise no fevers.  No shortness of breath.  No taste or smell disturbances.  She had COVID 2 times in the past.   No COVID exposures.  She is not vaccinated.  She reports taking a home COVID test back in April when she was ill at that time.   ROS: See pertinent positives and negatives per HPI.  Past Medical History:  Diagnosis Date   Allergy    Anxiety    Basal cell carcinoma    skin    Cardiomyopathy ([place])    EF 35% per pt   CHF (congestive heart failure) ([place])    COVID-19    2021, 12/2020    Diabetes type 2, uncontrolled ([place])    Heartburn    Hypertension     Past Surgical History:  Procedure Laterality Date   CHOLECYSTECTOMY  2008   COLONOSCOPY WITH PROPOFOL N/A 01/03/2017   Procedure: COLONOSCOPY WITH PROPOFOL;  Surgeon: Mauri Pole, MD;  Location: [facility];  Service: Endoscopy;  Laterality: N/A;   FOOT SURGERY  1982   "benign tumor bottom of right foot"   NASAL SEPTUM SURGERY  2000   Dr. Delma Freeze ENT [place]   uterine ablation     UVULECTOMY  2000    Family History  Problem Relation Age of Onset   Diabetes Mother    Hypertension Mother    Cancer Father        lung   Emphysema Father    Diabetes Sister    Diabetes Brother    Leukemia Brother    Stroke Brother    Heart attack Maternal Uncle    Breast cancer Paternal Aunt 45   Lymphoma Paternal Uncle  Polycystic ovary syndrome Daughter    Hypertension Daughter    Diabetes Mellitus II Maternal Grandmother    Colon cancer Neg Hx     SOCIAL HX: Former smoker   Current Outpatient Medications:    doxycycline (VIBRA-TABS) 100 MG tablet, Take 1 tablet (100 mg total) by mouth 2 (two) times daily., Disp: 14 tablet, Rfl: 0   atorvastatin (LIPITOR) 10 MG tablet, Take 1 tablet (10 mg total) by mouth daily., Disp: 90 tablet, Rfl: 1   buPROPion (WELLBUTRIN XL) 150 MG 24 hr tablet, Take 1 tablet (150 mg total) by mouth daily with breakfast., Disp: 90 tablet, Rfl: 3   carvedilol (COREG) 3.125 MG tablet, Take 1 tablet (3.125 mg total) by mouth 2 (two) times daily with a meal., Disp: 60 tablet, Rfl:  3   cetirizine (ZYRTEC) 10 MG tablet, Take 10 mg by mouth daily., Disp: , Rfl:    cyanocobalamin (,VITAMIN B-12,) 1000 MCG/ML injection, 1000 mcg (1 mL) intramuscular injection in the thigh ( vastus lateralis) once per month., Disp: 3 mL, Rfl: 4   furosemide (LASIX) 40 MG tablet, Take 40 mg by mouth daily as needed for edema. , Disp: , Rfl:    isosorbide mononitrate (IMDUR) 30 MG 24 hr tablet, Take 30 mg by mouth at bedtime. , Disp: , Rfl:    Lancets (ONETOUCH DELICA PLUS [id number]) MISC, USE AS DIRECTED, Disp: 100 each, Rfl: 0   lisinopril (ZESTRIL) 2.5 MG tablet, Take 1 tablet (2.5 mg total) by mouth daily., Disp: 90 tablet, Rfl: 1   lisinopril (ZESTRIL) 5 MG tablet, Take one tablet by mouth daily., Disp: 90 tablet, Rfl: 1   NEEDLE, DISP, 25 G (B-D DISP NEEDLE 25GX1") 25G X 1" MISC, Used to give B-12 injections., Disp: 10 each, Rfl: 0   nitroGLYCERIN (NITROSTAT) 0.4 MG SL tablet, , Disp: , Rfl:    pantoprazole (PROTONIX) 40 MG tablet, Take 1 tablet (40 mg total) by mouth daily as needed. TAKE 1 TABLET(40 MG) BY MOUTH DAILY, Disp: 90 tablet, Rfl: 1   potassium chloride (KLOR-CON) 10 MEQ tablet, Take 1 tablet (10 mEq total) by mouth daily as needed (swelling)., Disp: 30 tablet, Rfl: 1   POTASSIUM PO, Take 1 tablet by mouth daily. (Patient not taking: Reported on 05/18/2021), Disp: , Rfl:    Semaglutide,0.25 or 0.5MG /DOS, (OZEMPIC, 0.25 OR 0.5 MG/DOSE,) 2 MG/1.5ML SOPN, Inject 0.5 mg into the skin once a week., Disp: 1.5 mL, Rfl: 2   SYRINGE/NEEDLE, DISP, 1 ML (BD ECLIPSE SYRINGE) 25G X 5/8" 1 ML MISC, Used to give B-12 injections., Disp: 50 each, Rfl: 0  EXAM: This was a telephone visit and thus no physical exam was completed.  ASSESSMENT AND PLAN:  Discussed the following assessment and plan:  Problem List Items Addressed This Visit     Sinusitis    The patient has symptoms likely related to sinusitis.  She had incomplete treatment 2 months ago and reports her symptoms never completely  resolved though they did improve.  Discussed treatment with doxycycline.  Discussed risk of GI upset as well as skin sensitivity to the sun.  I encouraged her to wear sunscreen or long sleeves and a hat if she has to go outside while on this medication.  Discussed COVID testing though the patient deferred in office testing given her history of chronic sinus issues.  I did encourage her to do a home test.  She will let us know if she is not improving.  She will seek medical attention  for elevated fevers and shortness of breath.       Relevant Medications   doxycycline (VIBRA-TABS) 100 MG tablet    Return if symptoms worsen or fail to improve.   I discussed the assessment and treatment plan with the patient. The patient was provided an opportunity to ask questions and all were answered. The patient agreed with the plan and demonstrated an understanding of the instructions.   The patient was advised to call back or seek an in-person evaluation if the symptoms worsen or if the condition fails to improve as anticipated.  I provided 7 minutes of non-face-to-face time during this encounter.   Tommi Rumps, MD

## 2021-06-08 ENCOUNTER — Other Ambulatory Visit (INDEPENDENT_AMBULATORY_CARE_PROVIDER_SITE_OTHER): Payer: BC Managed Care – PPO

## 2021-06-08 ENCOUNTER — Other Ambulatory Visit: Payer: Self-pay

## 2021-06-08 DIAGNOSIS — E118 Type 2 diabetes mellitus with unspecified complications: Secondary | ICD-10-CM | POA: Diagnosis not present

## 2021-06-08 DIAGNOSIS — I10 Essential (primary) hypertension: Secondary | ICD-10-CM | POA: Diagnosis not present

## 2021-06-08 LAB — BASIC METABOLIC PANEL
BUN: 19 mg/dL (ref 6–23)
CO2: 23 mEq/L (ref 19–32)
Calcium: 9.2 mg/dL (ref 8.4–10.5)
Chloride: 107 mEq/L (ref 96–112)
Creatinine, Ser: 0.9 mg/dL (ref 0.40–1.20)
GFR: 69.85 mL/min (ref >60.00)
Glucose, Bld: 121 mg/dL — ABNORMAL HIGH (ref 70–99)
Potassium: 3.8 mEq/L (ref 3.5–5.1)
Sodium: 140 mEq/L (ref 135–145)

## 2021-06-13 ENCOUNTER — Ambulatory Visit: Payer: BC Managed Care – PPO | Admitting: Pulmonary Disease

## 2021-06-13 ENCOUNTER — Other Ambulatory Visit: Payer: Self-pay

## 2021-06-13 ENCOUNTER — Encounter: Payer: Self-pay | Admitting: Pulmonary Disease

## 2021-06-13 VITALS — BP 110/70 | HR 110 | Temp 98.4°F | Ht 67.0 in | Wt 188.2 lb

## 2021-06-13 DIAGNOSIS — G4733 Obstructive sleep apnea (adult) (pediatric): Secondary | ICD-10-CM

## 2021-06-13 DIAGNOSIS — F458 Other somatoform disorders: Secondary | ICD-10-CM | POA: Diagnosis not present

## 2021-06-13 NOTE — Progress Notes (Signed)
[facility]  Chief Complaint  Patient presents with   Follow-up    Sleeping with mouth open, swallowing air, abdomin distended in am.  This has been a problem for since started using, worse in last couple of weeks.      Constitutional:  BP 110/70 (BP Location: Right Arm, Patient Position: Sitting, Cuff Size: Large)   Pulse (!) 110   Temp 98.4 F (36.9 C) (Oral)   Ht 5\' 7"  (1.702 m)   Wt 188 lb 3.2 oz (85.4 kg)   SpO2 95%   BMI 29.48 kg/m   Past Medical History:  Anxiety, Systolic CHF, COVID 19 infection in 2021 and 2022, DM type 2, HTN  Past Surgical History:  She  has a past surgical history that includes Cholecystectomy (2008); Tonsillectomy (2000); Uvulectomy (2000); Nasal septum surgery (2000); Foot surgery (1982); uterine ablation; and Colonoscopy with propofol (N/A, 01/03/2017).  Brief Summary:  Katrina Kelly is a 60 y.o. female with obstructive sleep apnea.      Subjective:   She was seen initially by Derl Barrow.  She had home sleep study in February that showed severe obstructive sleep apnea.  She was set up on auto CPAP.  Download shows improvement in AHI with CPAP, but she struggled with aerophagia.  As a result she hasn't been able to use CPAP as much as she would like.  CPAP was helping her sleep and daytime alertness.  Physical Exam:   Appearance - well kempt   ENMT - no sinus tenderness, no oral exudate, no LAN, Mallampati 4 airway, no stridor, high arched palate, scalloped tongue  Respiratory - equal breath sounds bilaterally, no wheezing or rales  CV - s1s2 regular rate and rhythm, no murmurs  Ext - no clubbing, no edema  Skin - no rashes  Psych - normal mood and affect   Sleep Tests:  HST 02/12/21 >> AHI 57, SpO2 low 79%  Cardiac Tests:  Echo 10/18/20 >> EF 40 to 45%, grade 1 DD, PASP 20 mmHg  Social History:  She  reports that she quit smoking about 36 years ago. Her smoking use included cigarettes.  She has a 1.25 pack-year smoking history. She has never used smokeless tobacco. She reports current alcohol use of about 1.0 standard drink of alcohol per week. She reports that she does not use drugs.  Family History:  Her family history includes Breast cancer (age of onset: 80) in her paternal aunt; Cancer in her father; Diabetes in her brother, mother, and sister; Diabetes Mellitus II in her maternal grandmother; Emphysema in her father; Heart attack in her maternal uncle; Hypertension in her daughter and mother; Leukemia in her brother; Lymphoma in her paternal uncle; Polycystic ovary syndrome in her daughter; Stroke in her brother.     Assessment/Plan:   Obstructive sleep apnea. - reviewed her sleep study results - discussed how sleep apnea can impact her health - she uses [employer] for her DME - she reports benefit from CPAP therapy and has been compliant - main issue relates to aerophagia - will have her mask refitted; might need chin strap with smaller interface - continue auto CPAP 5 to 20 cm H2O  Non ischemic cardiomyopathy with chronic systolic CHF. - followed by Dr. Cruzita Lederer Pasi with Cardiology at [facility]  Time Spent Involved in Patient Care on Day of Examination:  26 minutes  Follow up:   Patient Instructions  Will have [employer] refit your CPAP mask  Can look up  mask options at [email or web address] or similar website  Follow up in 1 year  Medication List:   Allergies as of 06/13/2021       Reactions   Keflex [cephalexin]    Metformin And Related Diarrhea   Lactose Intolerance (gi)    GI symptoms        Medication List        Accurate as of June 13, 2021  9:19 AM. If you have any questions, ask your nurse or doctor.          atorvastatin 10 MG tablet Commonly known as: LIPITOR Take 1 tablet (10 mg total) by mouth daily.   B-D DISP NEEDLE 25GX1" 25G X 1" Misc Generic drug: NEEDLE (DISP) 25 G Used to give B-12 injections.   BD Eclipse Syringe 25G X 5/8" 1 ML  Misc Generic drug: SYRINGE/NEEDLE (DISP) 1 ML Used to give B-12 injections.   buPROPion 150 MG 24 hr tablet Commonly known as: WELLBUTRIN XL Take 1 tablet (150 mg total) by mouth daily with breakfast.   carvedilol 3.125 MG tablet Commonly known as: COREG Take 1 tablet (3.125 mg total) by mouth 2 (two) times daily with a meal.   cetirizine 10 MG tablet Commonly known as: ZYRTEC Take 10 mg by mouth daily.   cyanocobalamin 1000 MCG/ML injection Commonly known as: (VITAMIN B-12) 1000 mcg (1 mL) intramuscular injection in the thigh ( vastus lateralis) once per month.   doxycycline 100 MG tablet Commonly known as: VIBRA-TABS Take 1 tablet (100 mg total) by mouth 2 (two) times daily.   furosemide 40 MG tablet Commonly known as: LASIX Take 40 mg by mouth daily as needed for edema.   isosorbide mononitrate 30 MG 24 hr tablet Commonly known as: IMDUR Take 30 mg by mouth at bedtime.   lisinopril 5 MG tablet Commonly known as: ZESTRIL Take one tablet by mouth daily.   lisinopril 2.5 MG tablet Commonly known as: Zestril Take 1 tablet (2.5 mg total) by mouth daily.   nitroGLYCERIN 0.4 MG SL tablet Commonly known as: NITROSTAT   OneTouch Delica Plus [id number] Misc USE AS DIRECTED   Ozempic (0.25 or 0.5 MG/DOSE) 2 MG/1.5ML Sopn Generic drug: Semaglutide(0.25 or 0.5MG /DOS) Inject 0.5 mg into the skin once a week.   pantoprazole 40 MG tablet Commonly known as: PROTONIX Take 1 tablet (40 mg total) by mouth daily as needed. TAKE 1 TABLET(40 MG) BY MOUTH DAILY   potassium chloride 10 MEQ tablet Commonly known as: KLOR-CON Take 1 tablet (10 mEq total) by mouth daily as needed (swelling).   POTASSIUM PO Take 1 tablet by mouth daily.        Signature:  Chesley Mires, MD [place] Pager - [phone] 06/13/2021, 9:19 AM

## 2021-06-13 NOTE — Patient Instructions (Signed)
Will have [employer] refit your CPAP mask  Can look up mask options at [email or web address] or similar website  Follow up in 1 year

## 2021-06-21 ENCOUNTER — Other Ambulatory Visit: Payer: Self-pay | Admitting: Family

## 2021-06-21 DIAGNOSIS — E118 Type 2 diabetes mellitus with unspecified complications: Secondary | ICD-10-CM

## 2021-08-17 ENCOUNTER — Telehealth: Payer: Self-pay

## 2021-08-17 DIAGNOSIS — E118 Type 2 diabetes mellitus with unspecified complications: Secondary | ICD-10-CM

## 2021-08-17 MED ORDER — OZEMPIC (0.25 OR 0.5 MG/DOSE) 2 MG/1.5ML ~~LOC~~ SOPN: PEN_INJECTOR | SUBCUTANEOUS | 2 refills | Status: DC

## 2021-08-17 NOTE — Telephone Encounter (Signed)
Pt needs refill on OZEMPIC, 0.25 OR 0.5 MG/DOSE, 2 MG/1.5ML SOPN sent to [facility] on [address]

## 2021-08-24 NOTE — Telephone Encounter (Addendum)
Has PA been completed for medication? Also we can see if [facility] has sample we can give until PA completed.

## 2021-08-24 NOTE — Telephone Encounter (Signed)
Patient is calling in to check on the status of the refill below,she stated that her pharmacy needs insurance approval from our office to fill the prescription.She stated she has been without this medicine for over 2 weeks,please advise.

## 2021-08-24 NOTE — Telephone Encounter (Signed)
Placed call to pt. Let pt know we have medication sample of ozempic here for her.  Pt states her husband will be coming to pick it up.

## 2021-08-24 NOTE — Telephone Encounter (Signed)
Pt husband picked up medication from the front.

## 2021-08-27 ENCOUNTER — Telehealth: Payer: Self-pay

## 2021-08-27 NOTE — Telephone Encounter (Signed)
PA for ozempic initiated

## 2021-08-28 NOTE — Telephone Encounter (Signed)
Initiated in error

## 2021-08-30 ENCOUNTER — Other Ambulatory Visit: Payer: Self-pay

## 2021-08-30 ENCOUNTER — Telehealth: Payer: Self-pay | Admitting: Family

## 2021-08-30 NOTE — Telephone Encounter (Signed)
PA was approved. I called to tell patient & LM that she should be able to have [facility] process Ozempic again. I asked she call back if any issues.

## 2021-08-30 NOTE — Telephone Encounter (Signed)
I have completed PA & waiting in response.   For Future PA:  Rx Bin [id number] PCN [id number] GRP [id number] ID [id number]

## 2021-08-30 NOTE — Telephone Encounter (Signed)
Patient states that she was informed by the pharmacy that her Ozempic needs a PA.   Please advise

## 2021-09-17 ENCOUNTER — Other Ambulatory Visit: Payer: Self-pay

## 2021-09-17 ENCOUNTER — Ambulatory Visit (INDEPENDENT_AMBULATORY_CARE_PROVIDER_SITE_OTHER): Payer: Managed Care, Other (non HMO) | Admitting: Family

## 2021-09-17 ENCOUNTER — Encounter: Payer: Self-pay | Admitting: Family

## 2021-09-17 VITALS — BP 120/76 | HR 87 | Temp 96.0°F | Ht 67.01 in | Wt 193.4 lb

## 2021-09-17 DIAGNOSIS — E118 Type 2 diabetes mellitus with unspecified complications: Secondary | ICD-10-CM

## 2021-09-17 DIAGNOSIS — F320 Major depressive disorder, single episode, mild: Secondary | ICD-10-CM

## 2021-09-17 DIAGNOSIS — I10 Essential (primary) hypertension: Secondary | ICD-10-CM

## 2021-09-17 DIAGNOSIS — E785 Hyperlipidemia, unspecified: Secondary | ICD-10-CM | POA: Diagnosis not present

## 2021-09-17 DIAGNOSIS — I428 Other cardiomyopathies: Secondary | ICD-10-CM

## 2021-09-17 LAB — COMPREHENSIVE METABOLIC PANEL
ALT: 18 U/L (ref 0–35)
AST: 16 U/L (ref 0–37)
Albumin: 4 g/dL (ref 3.5–5.2)
Alkaline Phosphatase: 74 U/L (ref 39–117)
BUN: 9 mg/dL (ref 6–23)
CO2: 24 mEq/L (ref 19–32)
Calcium: 9.1 mg/dL (ref 8.4–10.5)
Chloride: 106 mEq/L (ref 96–112)
Creatinine, Ser: 0.9 mg/dL (ref 0.40–1.20)
GFR: 69.71 mL/min (ref >60.00)
Glucose, Bld: 119 mg/dL — ABNORMAL HIGH (ref 70–99)
Potassium: 3.8 mEq/L (ref 3.5–5.1)
Sodium: 140 mEq/L (ref 135–145)
Total Bilirubin: 0.6 mg/dL (ref 0.2–1.2)
Total Protein: 6.7 g/dL (ref 6.0–8.3)

## 2021-09-17 LAB — POCT GLYCOSYLATED HEMOGLOBIN (HGB A1C): Hemoglobin A1C: 5.9 % — AB (ref 4.0–5.6)

## 2021-09-17 MED ORDER — OZEMPIC (1 MG/DOSE) 4 MG/3ML ~~LOC~~ SOPN: 1.0000 mg | PEN_INJECTOR | SUBCUTANEOUS | 2 refills | Status: DC

## 2021-09-17 NOTE — Assessment & Plan Note (Signed)
Symptomatically stable.  She continues to follow with cardiology.  Continue imdur 30mg , lasix 40mg  prn edema, potassium chloride 89meq PRN with lasix

## 2021-09-17 NOTE — Progress Notes (Signed)
Subjective:    Patient ID: Katrina Kelly, female    DOB: 1961/04/22, 60 y.o.   MRN: [id number]  CC: Katrina Kelly is a 60 y.o. female who presents today for follow up.   HPI: Feels well today.  No new complaints.  No formal exercise however staying active as she watches granddaughter. She enjoys this.     HTN - compliant with  coreg 3.125mg  BID, lisinopril 7.5mg    Depression- compliant with wellbutirn. Mood is better. Dose is appropriate.    DM- compliant with ozempic 0.5mg . She had lost 20 lbs initially with ozempic. She would like to loose weight and endorses dietary indiscretion with sweets  HLD- compliant with lipitor 10mg .   Follow with Dr. Halford Chessman, allergy OSA and compliant with CPAP. Last seen 06/13/21   She is also following with Dr. Pasi cardiology at [facility] for cardiomyopathy chronic systolic CHF. Last seen 10/2020  She is compliant with imdur 30mg , lasix 40mg  prn edema, potassium chloride 86meq PRN with lasix. No cp, sob, leg swelling, orthopnea.   HISTORY:  Past Medical History:  Diagnosis Date   Allergy    Anxiety    Basal cell carcinoma    skin    Cardiomyopathy ([place])    EF 35% per pt   CHF (congestive heart failure) ([place])    COVID-19    2021, 12/2020    Diabetes type 2, uncontrolled    Heartburn    Hypertension    Past Surgical History:  Procedure Laterality Date   CHOLECYSTECTOMY  2008   COLONOSCOPY WITH PROPOFOL N/A 01/03/2017   Procedure: COLONOSCOPY WITH PROPOFOL;  Surgeon: Mauri Pole, MD;  Location: [facility];  Service: Endoscopy;  Laterality: N/A;   FOOT SURGERY  1982   "benign tumor bottom of right foot"   NASAL SEPTUM SURGERY  2000   Dr. Delma Freeze ENT [facility] [place]   TONSILLECTOMY  2000   uterine ablation     UVULECTOMY  2000   Family History  Problem Relation Age of Onset   Diabetes Mother    Hypertension Mother    Cancer Father        lung   Emphysema Father    Diabetes Sister    Diabetes Brother    Leukemia Brother    Stroke  Brother    Heart attack Maternal Uncle    Breast cancer Paternal Aunt 39   Lymphoma Paternal Uncle    Polycystic ovary syndrome Daughter    Hypertension Daughter    Diabetes Mellitus II Maternal Grandmother    Colon cancer Neg Hx     Allergies: Keflex [cephalexin], Metformin and related, and Lactose intolerance (gi) Current Outpatient Medications on File Prior to Visit  Medication Sig Dispense Refill   atorvastatin (LIPITOR) 10 MG tablet Take 1 tablet (10 mg total) by mouth daily. 90 tablet 1   buPROPion (WELLBUTRIN XL) 150 MG 24 hr tablet Take 1 tablet (150 mg total) by mouth daily with breakfast. 90 tablet 3   carvedilol (COREG) 3.125 MG tablet Take 1 tablet (3.125 mg total) by mouth 2 (two) times daily with a meal. 60 tablet 3   cetirizine (ZYRTEC) 10 MG tablet Take 10 mg by mouth daily.     cyanocobalamin (,VITAMIN B-12,) 1000 MCG/ML injection 1000 mcg (1 mL) intramuscular injection in the thigh ( vastus lateralis) once per month. 3 mL 4   furosemide (LASIX) 40 MG tablet Take 40 mg by mouth daily as needed for edema.      isosorbide mononitrate (  IMDUR) 30 MG 24 hr tablet Take 30 mg by mouth at bedtime.      Lancets (ONETOUCH DELICA PLUS [id number]) MISC USE AS DIRECTED 100 each 0   lisinopril (ZESTRIL) 2.5 MG tablet Take 1 tablet (2.5 mg total) by mouth daily. 90 tablet 1   lisinopril (ZESTRIL) 5 MG tablet Take one tablet by mouth daily. 90 tablet 1   NEEDLE, DISP, 25 G (B-D DISP NEEDLE 25GX1") 25G X 1" MISC Used to give B-12 injections. 10 each 0   nitroGLYCERIN (NITROSTAT) 0.4 MG SL tablet      pantoprazole (PROTONIX) 40 MG tablet Take 1 tablet (40 mg total) by mouth daily as needed. TAKE 1 TABLET(40 MG) BY MOUTH DAILY 90 tablet 1   potassium chloride (KLOR-CON) 10 MEQ tablet Take 1 tablet (10 mEq total) by mouth daily as needed (swelling). 30 tablet 1   SYRINGE/NEEDLE, DISP, 1 ML (BD ECLIPSE SYRINGE) 25G X 5/8" 1 ML MISC Used to give B-12 injections. 50 each 0   POTASSIUM PO Take 1  tablet by mouth daily. (Patient not taking: Reported on 09/17/2021)     No current facility-administered medications on file prior to visit.    Social History   Tobacco Use   Smoking status: Former    Packs/day: 0.25    Years: 5.00    Pack years: 1.25    Types: Cigarettes    Quit date: 1986    Years since quitting: 36.7   Smokeless tobacco: Never  Vaping Use   Vaping Use: Never used  Substance Use Topics   Alcohol use: Yes    Alcohol/week: 1.0 standard drink    Types: 1 Standard drinks or equivalent per week    Comment: twice monthly   Drug use: No    Review of Systems  Constitutional:  Negative for chills and fever.  Respiratory:  Negative for cough and shortness of breath.   Cardiovascular:  Negative for chest pain, palpitations and leg swelling.  Gastrointestinal:  Negative for nausea and vomiting.     Objective:    BP 120/76 (BP Location: Left Arm, Patient Position: Sitting)   Pulse 87   Temp (!) 96 F (35.6 C)   Ht 5' 7.01" (1.702 m)   Wt 193 lb 6.4 oz (87.7 kg)   SpO2 97%   BMI 30.28 kg/m  BP Readings from Last 3 Encounters:  09/17/21 120/76  06/13/21 110/70  05/18/21 110/62   Wt Readings from Last 3 Encounters:  09/17/21 193 lb 6.4 oz (87.7 kg)  06/13/21 188 lb 3.2 oz (85.4 kg)  05/18/21 186 lb 12.8 oz (84.7 kg)    Physical Exam Vitals reviewed.  Constitutional:      Appearance: She is well-developed.  Eyes:     Conjunctiva/sclera: Conjunctivae normal.  Cardiovascular:     Rate and Rhythm: Normal rate and regular rhythm.     Pulses: Normal pulses.     Heart sounds: Normal heart sounds.  Pulmonary:     Effort: Pulmonary effort is normal.     Breath sounds: Normal breath sounds. No wheezing, rhonchi or rales.  Musculoskeletal:     Right lower leg: No edema.     Left lower leg: No edema.  Skin:    General: Skin is warm and dry.  Neurological:     Mental Status: She is alert.  Psychiatric:        Speech: Speech normal.        Behavior:  Behavior normal.  Thought Content: Thought content normal.       Assessment & Plan:   Problem List Items Addressed This Visit       Cardiovascular and Mediastinum   Hypertension    Chronic, stable.  Continue  coreg 3.125mg  BID, lisinopril 7.5mg       NICM (nonischemic cardiomyopathy) (HCC)    Symptomatically stable.  She continues to follow with cardiology.  Continue imdur 30mg , lasix 40mg  prn edema, potassium chloride 79meq PRN with lasix        Endocrine   Controlled diabetes mellitus type 2 with complications ([place]) - Primary    Lab Results  Component Value Date   HGBA1C 5.9 (A) 09/17/2021  Chronic, controlled.  Additional benefit for weight loss, we opted to increase Ozempic to 1 mg.      Relevant Medications   Semaglutide, 1 MG/DOSE, (OZEMPIC, 1 MG/DOSE,) 4 MG/3ML SOPN   Other Relevant Orders   POCT A1C (Completed)   Comprehensive metabolic panel     Other   Depression, major, single episode, mild (HCC)    Chronic, stable.  Continue Wellbutrin 150 mg      Hyperlipidemia    Chronic, stable.  Continue Lipitor 10 mg        I have discontinued Ivin Booty Fogal's doxycycline and Ozempic (0.25 or 0.5 MG/DOSE). I am also having her start on Ozempic (1 MG/DOSE). Additionally, I am having her maintain her isosorbide mononitrate, cetirizine, furosemide, carvedilol, pantoprazole, potassium chloride, atorvastatin, OneTouch Delica Plus [id number], nitroGLYCERIN, POTASSIUM PO, buPROPion, B-D DISP NEEDLE 25GX1", BD Eclipse Syringe, cyanocobalamin, lisinopril, and lisinopril.   Meds ordered this encounter  Medications   Semaglutide, 1 MG/DOSE, (OZEMPIC, 1 MG/DOSE,) 4 MG/3ML SOPN    Sig: Inject 1 mg into the skin once a week.    Dispense:  3 mL    Refill:  2    Order Specific Question:   Supervising Provider    Answer:   Crecencio Mc [[id number]]     Return precautions given.   Risks, benefits, and alternatives of the medications and treatment plan prescribed today were  discussed, and patient expressed understanding.   Education regarding symptom management and diagnosis given to patient on AVS.  Continue to follow with Burnard Hawthorne, FNP for routine health maintenance.   Salley Hews and I agreed with plan.   Mable Paris, FNP

## 2021-09-17 NOTE — Assessment & Plan Note (Signed)
Chronic, stable.  Continue  coreg 3.125mg  BID, lisinopril 7.5mg 

## 2021-09-17 NOTE — Assessment & Plan Note (Signed)
Lab Results  Component Value Date   HGBA1C 5.9 (A) 09/17/2021   Chronic, controlled.  Additional benefit for weight loss, we opted to increase Ozempic to 1 mg.

## 2021-09-17 NOTE — Assessment & Plan Note (Signed)
Chronic, stable.  Continue Lipitor 10 mg ?

## 2021-09-17 NOTE — Assessment & Plan Note (Signed)
Chronic, stable.  Continue Wellbutrin 150 mg

## 2021-09-17 NOTE — Patient Instructions (Addendum)
Consider Mounjaro in the future.  Yes increase Ozempic to 1 mg.  Always so nice to see you!

## 2021-10-31 ENCOUNTER — Other Ambulatory Visit: Payer: Self-pay | Admitting: Family

## 2021-12-18 ENCOUNTER — Other Ambulatory Visit: Payer: Self-pay

## 2021-12-18 ENCOUNTER — Ambulatory Visit (INDEPENDENT_AMBULATORY_CARE_PROVIDER_SITE_OTHER): Payer: Managed Care, Other (non HMO) | Admitting: Family

## 2021-12-18 ENCOUNTER — Encounter: Payer: Self-pay | Admitting: Family

## 2021-12-18 VITALS — BP 122/74 | HR 89 | Temp 95.5°F | Ht 67.01 in | Wt 185.6 lb

## 2021-12-18 DIAGNOSIS — E118 Type 2 diabetes mellitus with unspecified complications: Secondary | ICD-10-CM

## 2021-12-18 DIAGNOSIS — Z23 Encounter for immunization: Secondary | ICD-10-CM

## 2021-12-18 DIAGNOSIS — I10 Essential (primary) hypertension: Secondary | ICD-10-CM | POA: Diagnosis not present

## 2021-12-18 DIAGNOSIS — R5383 Other fatigue: Secondary | ICD-10-CM

## 2021-12-18 DIAGNOSIS — I428 Other cardiomyopathies: Secondary | ICD-10-CM

## 2021-12-18 DIAGNOSIS — F320 Major depressive disorder, single episode, mild: Secondary | ICD-10-CM

## 2021-12-18 DIAGNOSIS — E785 Hyperlipidemia, unspecified: Secondary | ICD-10-CM

## 2021-12-18 LAB — POCT GLYCOSYLATED HEMOGLOBIN (HGB A1C): Hemoglobin A1C: 6 % — AB (ref 4.0–5.6)

## 2021-12-18 LAB — MICROALBUMIN / CREATININE URINE RATIO
Creatinine,U: 251 mg/dL
Microalb Creat Ratio: 0.8 mg/g (ref 0.0–30.0)
Microalb, Ur: 2.1 mg/dL — ABNORMAL HIGH (ref 0.0–1.9)

## 2021-12-18 MED ORDER — CARVEDILOL 3.125 MG PO TABS: 3.1250 mg | ORAL_TABLET | Freq: Two times a day (BID) | ORAL | 3 refills | Status: DC

## 2021-12-18 MED ORDER — LISINOPRIL 5 MG PO TABS: ORAL_TABLET | ORAL | 3 refills | Status: DC

## 2021-12-18 MED ORDER — ATORVASTATIN CALCIUM 10 MG PO TABS: 10.0000 mg | ORAL_TABLET | Freq: Every day | ORAL | 3 refills | Status: DC

## 2021-12-18 MED ORDER — CYANOCOBALAMIN 1000 MCG/ML IJ SOLN: INTRAMUSCULAR | 4 refills | Status: DC

## 2021-12-18 MED ORDER — LISINOPRIL 2.5 MG PO TABS: ORAL_TABLET | ORAL | 3 refills | Status: DC

## 2021-12-18 MED ORDER — BUPROPION HCL ER (XL) 150 MG PO TB24: 150.0000 mg | ORAL_TABLET | Freq: Every day | ORAL | 3 refills | Status: DC

## 2021-12-18 MED ORDER — ISOSORBIDE MONONITRATE ER 30 MG PO TB24: 30.0000 mg | ORAL_TABLET | Freq: Every day | ORAL | 3 refills | Status: DC

## 2021-12-18 NOTE — Assessment & Plan Note (Signed)
Symptomatically stable.  Very rare use of Lasix.  Continue Imdur 30 mg.

## 2021-12-18 NOTE — Patient Instructions (Signed)
Nice to see you!   

## 2021-12-18 NOTE — Assessment & Plan Note (Signed)
Chronic, stable.  Continue Coreg 3.125 mg twice daily, lisinopril 7.5 mg 

## 2021-12-18 NOTE — Assessment & Plan Note (Signed)
LDL < 70. Continue lipitor 20mg 

## 2021-12-18 NOTE — Progress Notes (Signed)
Subjective:    Patient ID: Katrina Kelly, female    DOB: 1961/11/20, 61 y.o.   MRN: [id number]  CC: Katrina Kelly is a 61 y.o. female who presents today for follow up.   HPI: Feels well today No new complaints   HTN-she remains compliant with Coreg 3.125 mg twice daily, lisinopril 7.5 mg. No cp, sob.   Remains compliant with Imdur, Lasix, potassium chloride . Very rare use of lasix.  She follows with Dr Pasi every 2 years now. She requests refill of Imdur.  Increased Ozempic to 1 mg at prior visit and she feels has been helping. She is snacking less. She is pleased with weight loss and can tell clothes fit looser.   She is compliant with Lipitor.  Due pcv20 HISTORY:  Past Medical History:  Diagnosis Date   Allergy    Anxiety    Basal cell carcinoma    skin    Cardiomyopathy ([place])    EF 35% per pt   CHF (congestive heart failure) ([place])    COVID-19    2021, 12/2020    Diabetes type 2, uncontrolled    Heartburn    Hypertension    Past Surgical History:  Procedure Laterality Date   CHOLECYSTECTOMY  2008   COLONOSCOPY WITH PROPOFOL N/A 01/03/2017   Procedure: COLONOSCOPY WITH PROPOFOL;  Surgeon: Mauri Pole, MD;  Location: [facility];  Service: Endoscopy;  Laterality: N/A;   FOOT SURGERY  1982   "benign tumor bottom of right foot"   NASAL SEPTUM SURGERY  2000   Dr. Delma Freeze ENT [facility] [place]   TONSILLECTOMY  2000   uterine ablation     UVULECTOMY  2000   Family History  Problem Relation Age of Onset   Diabetes Mother    Hypertension Mother    Cancer Father        lung   Emphysema Father    Diabetes Sister    Diabetes Brother    Leukemia Brother    Stroke Brother    Heart attack Maternal Uncle    Breast cancer Paternal Aunt 45   Lymphoma Paternal Uncle    Polycystic ovary syndrome Daughter    Hypertension Daughter    Diabetes Mellitus II Maternal Grandmother    Colon cancer Neg Hx     Allergies: Keflex [cephalexin], Metformin and related, and Lactose  intolerance (gi) Current Outpatient Medications on File Prior to Visit  Medication Sig Dispense Refill   cetirizine (ZYRTEC) 10 MG tablet Take 10 mg by mouth daily.     furosemide (LASIX) 40 MG tablet Take 40 mg by mouth daily as needed for edema.      Lancets (ONETOUCH DELICA PLUS [id number]) MISC USE AS DIRECTED 100 each 0   NEEDLE, DISP, 25 G (B-D DISP NEEDLE 25GX1") 25G X 1" MISC Used to give B-12 injections. 10 each 0   nitroGLYCERIN (NITROSTAT) 0.4 MG SL tablet      pantoprazole (PROTONIX) 40 MG tablet Take 1 tablet (40 mg total) by mouth daily as needed. TAKE 1 TABLET(40 MG) BY MOUTH DAILY 90 tablet 1   potassium chloride (KLOR-CON) 10 MEQ tablet Take 1 tablet (10 mEq total) by mouth daily as needed (swelling). 30 tablet 1   POTASSIUM PO Take 1 tablet by mouth daily.     Semaglutide, 1 MG/DOSE, (OZEMPIC, 1 MG/DOSE,) 4 MG/3ML SOPN Inject 1 mg into the skin once a week. 3 mL 2   SYRINGE/NEEDLE, DISP, 1 ML (BD ECLIPSE SYRINGE) 25G X 5/8"  1 ML MISC Used to give B-12 injections. 50 each 0   No current facility-administered medications on file prior to visit.    Social History   Tobacco Use   Smoking status: Former    Packs/day: 0.25    Years: 5.00    Pack years: 1.25    Types: Cigarettes    Quit date: 1986    Years since quitting: 37.0   Smokeless tobacco: Never  Vaping Use   Vaping Use: Never used  Substance Use Topics   Alcohol use: Yes    Alcohol/week: 1.0 standard drink    Types: 1 Standard drinks or equivalent per week    Comment: twice monthly   Drug use: No    Review of Systems  Constitutional:  Negative for chills and fever.  Respiratory:  Negative for cough.   Cardiovascular:  Negative for chest pain and palpitations.  Gastrointestinal:  Negative for nausea and vomiting.     Objective:    BP 122/74    Pulse 89    Temp (!) 95.5 F (35.3 C)    Ht 5' 7.01" (1.702 m)    Wt 185 lb 9.6 oz (84.2 kg)    SpO2 96%    BMI 29.06 kg/m  BP Readings from Last 3 Encounters:   12/18/21 122/74  09/17/21 120/76  06/13/21 110/70   Wt Readings from Last 3 Encounters:  12/18/21 185 lb 9.6 oz (84.2 kg)  09/17/21 193 lb 6.4 oz (87.7 kg)  06/13/21 188 lb 3.2 oz (85.4 kg)    Physical Exam Vitals reviewed.  Constitutional:      Appearance: She is well-developed.  Eyes:     Conjunctiva/sclera: Conjunctivae normal.  Cardiovascular:     Rate and Rhythm: Normal rate and regular rhythm.     Pulses: Normal pulses.     Heart sounds: Normal heart sounds.  Pulmonary:     Effort: Pulmonary effort is normal.     Breath sounds: Normal breath sounds. No wheezing, rhonchi or rales.  Musculoskeletal:     Right lower leg: No edema.     Left lower leg: No edema.  Skin:    General: Skin is warm and dry.  Neurological:     Mental Status: She is alert.  Psychiatric:        Speech: Speech normal.        Behavior: Behavior normal.        Thought Content: Thought content normal.       Assessment & Plan:   Problem List Items Addressed This Visit       Cardiovascular and Mediastinum   Hypertension - Primary    Chronic, stable. Continue Coreg 3.125 mg twice daily, lisinopril 7.5 mg      Relevant Medications   atorvastatin (LIPITOR) 10 MG tablet   carvedilol (COREG) 3.125 MG tablet   isosorbide mononitrate (IMDUR) 30 MG 24 hr tablet   lisinopril (ZESTRIL) 5 MG tablet   lisinopril (ZESTRIL) 2.5 MG tablet   NICM (nonischemic cardiomyopathy) (HCC)    Symptomatically stable.  Very rare use of Lasix.  Continue Imdur 30 mg.      Relevant Medications   atorvastatin (LIPITOR) 10 MG tablet   carvedilol (COREG) 3.125 MG tablet   isosorbide mononitrate (IMDUR) 30 MG 24 hr tablet   lisinopril (ZESTRIL) 5 MG tablet   lisinopril (ZESTRIL) 2.5 MG tablet     Endocrine   Controlled diabetes mellitus type 2 with complications (HCC)    I4P 6.0 today.  Excellent  control.  She is actively losing weight, continue Ozempic 1 mg.      Relevant Medications   atorvastatin  (LIPITOR) 10 MG tablet   cyanocobalamin (,VITAMIN B-12,) 1000 MCG/ML injection   lisinopril (ZESTRIL) 5 MG tablet   lisinopril (ZESTRIL) 2.5 MG tablet   Other Relevant Orders   POCT HgB A1C   Microalbumin / creatinine urine ratio     Other   Depression, major, single episode, mild (HCC)   Relevant Medications   buPROPion (WELLBUTRIN XL) 150 MG 24 hr tablet   Fatigue   Relevant Medications   buPROPion (WELLBUTRIN XL) 150 MG 24 hr tablet   Hyperlipidemia    LDL < 70. Continue lipitor 20mg       Relevant Medications   atorvastatin (LIPITOR) 10 MG tablet   carvedilol (COREG) 3.125 MG tablet   isosorbide mononitrate (IMDUR) 30 MG 24 hr tablet   lisinopril (ZESTRIL) 5 MG tablet   lisinopril (ZESTRIL) 2.5 MG tablet     I have changed Ivin Booty Difranco's isosorbide mononitrate. I am also having her maintain her cetirizine, furosemide, pantoprazole, potassium chloride, OneTouch Delica Plus [id number], nitroGLYCERIN, POTASSIUM PO, B-D DISP NEEDLE 25GX1", BD Eclipse Syringe, Ozempic (1 MG/DOSE), atorvastatin, buPROPion, carvedilol, cyanocobalamin, lisinopril, and lisinopril.   Meds ordered this encounter  Medications   atorvastatin (LIPITOR) 10 MG tablet    Sig: Take 1 tablet (10 mg total) by mouth daily.    Dispense:  90 tablet    Refill:  3   buPROPion (WELLBUTRIN XL) 150 MG 24 hr tablet    Sig: Take 1 tablet (150 mg total) by mouth daily with breakfast.    Dispense:  90 tablet    Refill:  3   carvedilol (COREG) 3.125 MG tablet    Sig: Take 1 tablet (3.125 mg total) by mouth 2 (two) times daily with a meal.    Dispense:  60 tablet    Refill:  3   cyanocobalamin (,VITAMIN B-12,) 1000 MCG/ML injection    Sig: 1000 mcg (1 mL) intramuscular injection in the thigh ( vastus lateralis) once per month.    Dispense:  3 mL    Refill:  4   isosorbide mononitrate (IMDUR) 30 MG 24 hr tablet    Sig: Take 1 tablet (30 mg total) by mouth at bedtime.    Dispense:  90 tablet    Refill:  3    lisinopril (ZESTRIL) 5 MG tablet    Sig: Take one tablet by mouth daily.    Dispense:  90 tablet    Refill:  3   lisinopril (ZESTRIL) 2.5 MG tablet    Sig: TAKE 1 TABLET(2.5 MG) BY MOUTH DAILY    Dispense:  90 tablet    Refill:  3    Order Specific Question:   Supervising Provider    Answer:   Crecencio Mc [[id number]]    Return precautions given.   Risks, benefits, and alternatives of the medications and treatment plan prescribed today were discussed, and patient expressed understanding.   Education regarding symptom management and diagnosis given to patient on AVS.  Continue to follow with Burnard Hawthorne, FNP for routine health maintenance.   Salley Hews and I agreed with plan.   Mable Paris, FNP

## 2021-12-18 NOTE — Assessment & Plan Note (Signed)
a1c 6.0 today.  Excellent control.  She is actively losing weight, continue Ozempic 1 mg.

## 2021-12-27 ENCOUNTER — Telehealth: Payer: Self-pay

## 2021-12-27 NOTE — Telephone Encounter (Signed)
LMTCB for lab results.  

## 2021-12-28 ENCOUNTER — Other Ambulatory Visit: Payer: Self-pay

## 2021-12-28 ENCOUNTER — Other Ambulatory Visit: Payer: Self-pay | Admitting: Family

## 2021-12-28 DIAGNOSIS — I10 Essential (primary) hypertension: Secondary | ICD-10-CM

## 2021-12-28 MED ORDER — LISINOPRIL 10 MG PO TABS: 10.0000 mg | ORAL_TABLET | Freq: Every day | ORAL | 3 refills | Status: DC

## 2021-12-28 MED ORDER — CARVEDILOL 6.25 MG PO TABS: 6.2500 mg | ORAL_TABLET | Freq: Two times a day (BID) | ORAL | 3 refills | Status: DC

## 2021-12-28 NOTE — Telephone Encounter (Signed)
Pt returning call

## 2021-12-28 NOTE — Telephone Encounter (Signed)
See result note.  

## 2022-02-21 ENCOUNTER — Other Ambulatory Visit: Payer: Self-pay | Admitting: Family

## 2022-02-21 DIAGNOSIS — E118 Type 2 diabetes mellitus with unspecified complications: Secondary | ICD-10-CM

## 2022-03-13 ENCOUNTER — Other Ambulatory Visit (INDEPENDENT_AMBULATORY_CARE_PROVIDER_SITE_OTHER): Payer: Managed Care, Other (non HMO)

## 2022-03-13 DIAGNOSIS — I10 Essential (primary) hypertension: Secondary | ICD-10-CM

## 2022-03-14 LAB — BASIC METABOLIC PANEL
BUN: 14 mg/dL (ref 6–23)
CO2: 25 mEq/L (ref 19–32)
Calcium: 9.2 mg/dL (ref 8.4–10.5)
Chloride: 105 mEq/L (ref 96–112)
Creatinine, Ser: 1.02 mg/dL (ref 0.40–1.20)
GFR: 59.78 mL/min — ABNORMAL LOW (ref >60.00)
Glucose, Bld: 120 mg/dL — ABNORMAL HIGH (ref 70–99)
Potassium: 4 mEq/L (ref 3.5–5.1)
Sodium: 139 mEq/L (ref 135–145)

## 2022-03-18 ENCOUNTER — Ambulatory Visit: Payer: Managed Care, Other (non HMO) | Admitting: Family

## 2022-04-08 ENCOUNTER — Telehealth: Payer: Self-pay

## 2022-04-08 NOTE — Telephone Encounter (Signed)
I called patient to prechart for tomorrow but was unable to reach & received VM.  ?

## 2022-04-09 ENCOUNTER — Encounter: Payer: Self-pay | Admitting: Family

## 2022-04-09 ENCOUNTER — Ambulatory Visit (INDEPENDENT_AMBULATORY_CARE_PROVIDER_SITE_OTHER): Payer: Managed Care, Other (non HMO) | Admitting: Family

## 2022-04-09 VITALS — BP 126/82 | HR 92 | Temp 98.7°F | Ht 67.0 in | Wt 182.4 lb

## 2022-04-09 DIAGNOSIS — I10 Essential (primary) hypertension: Secondary | ICD-10-CM

## 2022-04-09 DIAGNOSIS — E785 Hyperlipidemia, unspecified: Secondary | ICD-10-CM

## 2022-04-09 DIAGNOSIS — E118 Type 2 diabetes mellitus with unspecified complications: Secondary | ICD-10-CM

## 2022-04-09 DIAGNOSIS — E538 Deficiency of other specified B group vitamins: Secondary | ICD-10-CM

## 2022-04-09 DIAGNOSIS — Z114 Encounter for screening for human immunodeficiency virus [HIV]: Secondary | ICD-10-CM

## 2022-04-09 DIAGNOSIS — Z23 Encounter for immunization: Secondary | ICD-10-CM

## 2022-04-09 DIAGNOSIS — R5383 Other fatigue: Secondary | ICD-10-CM

## 2022-04-09 DIAGNOSIS — I428 Other cardiomyopathies: Secondary | ICD-10-CM

## 2022-04-09 LAB — COMPREHENSIVE METABOLIC PANEL
ALT: 15 U/L (ref 0–35)
AST: 14 U/L (ref 0–37)
Albumin: 4.2 g/dL (ref 3.5–5.2)
Alkaline Phosphatase: 81 U/L (ref 39–117)
BUN: 8 mg/dL (ref 6–23)
CO2: 23 mEq/L (ref 19–32)
Calcium: 8.9 mg/dL (ref 8.4–10.5)
Chloride: 106 mEq/L (ref 96–112)
Creatinine, Ser: 0.89 mg/dL (ref 0.40–1.20)
GFR: 70.38 mL/min (ref >60.00)
Glucose, Bld: 99 mg/dL (ref 70–99)
Potassium: 3.9 mEq/L (ref 3.5–5.1)
Sodium: 139 mEq/L (ref 135–145)
Total Bilirubin: 0.6 mg/dL (ref 0.2–1.2)
Total Protein: 6.9 g/dL (ref 6.0–8.3)

## 2022-04-09 LAB — CBC WITH DIFFERENTIAL/PLATELET
Basophils Absolute: 0.1 10*3/uL (ref 0.0–0.1)
Basophils Relative: 1.4 % (ref 0.0–3.0)
Eosinophils Absolute: 0.1 10*3/uL (ref 0.0–0.7)
Eosinophils Relative: 2 % (ref 0.0–5.0)
HCT: 40.2 % (ref 36.0–46.0)
Hemoglobin: 13.5 g/dL (ref 12.0–15.0)
Lymphocytes Relative: 40.3 % (ref 12.0–46.0)
Lymphs Abs: 2.8 10*3/uL (ref 0.7–4.0)
MCHC: 33.7 g/dL (ref 30.0–36.0)
MCV: 88.6 fl (ref 78.0–100.0)
Monocytes Absolute: 0.5 10*3/uL (ref 0.1–1.0)
Monocytes Relative: 7.8 % (ref 3.0–12.0)
Neutro Abs: 3.3 10*3/uL (ref 1.4–7.7)
Neutrophils Relative %: 48.5 % (ref 43.0–77.0)
Platelets: 299 10*3/uL (ref 150.0–400.0)
RBC: 4.54 Mil/uL (ref 3.87–5.11)
RDW: 13.3 % (ref 11.5–15.5)
WBC: 6.9 10*3/uL (ref 4.0–10.5)

## 2022-04-09 LAB — TSH: TSH: 3.14 u[IU]/mL (ref 0.35–5.50)

## 2022-04-09 LAB — HEMOGLOBIN A1C: Hgb A1c MFr Bld: 6 % (ref 4.6–6.5)

## 2022-04-09 LAB — LIPID PANEL
Cholesterol: 119 mg/dL (ref 0–200)
HDL: 41.3 mg/dL (ref >39.00)
LDL Cholesterol: 53 mg/dL (ref 0–99)
NonHDL: 77.25
Total CHOL/HDL Ratio: 3
Triglycerides: 123 mg/dL (ref 0.0–149.0)
VLDL: 24.6 mg/dL (ref 0.0–40.0)

## 2022-04-09 LAB — VITAMIN D 25 HYDROXY (VIT D DEFICIENCY, FRACTURES): VITD: 8.62 ng/mL — ABNORMAL LOW (ref 30.00–100.00)

## 2022-04-09 LAB — B12 AND FOLATE PANEL
Folate: 10.2 ng/mL (ref >5.9)
Vitamin B-12: 372 pg/mL (ref 211–911)

## 2022-04-09 MED ORDER — CYANOCOBALAMIN 1000 MCG/ML IJ SOLN: INTRAMUSCULAR | 4 refills | Status: DC

## 2022-04-09 NOTE — Progress Notes (Signed)
? ?Subjective:  ? ? Patient ID: Katrina Kelly, female    DOB: Jul 11, 1961, 61 y.o.   MRN: [id number] ? ?CC: Katrina Kelly is a 61 y.o. female who presents today for follow up.  ? ?HPI: Feels well today ?No new complaints ? ? ?B12 deficiency- she feels better on b12 and fatigue improved. She requests refill ? ?HTN- compliant with Coreg 3.125 mg twice daily, lisinopril 7.5 mg ?NICM- she takes lasix prn, very rarely. She remains compliant with imdur '30mg'$ . No cp,  sob, leg swelling ? ?DM- compliant with ozempic '1mg'$ . Tolerating medication. She continues to loose weight slowly. She has lost 25 lbs.  ?HLD- compliant with lipitor '10mg'$  ?HISTORY:  ?Past Medical History:  ?Diagnosis Date  ? Allergy   ? Anxiety   ? Basal cell carcinoma   ? skin   ? Cardiomyopathy ([place])   ? EF 35% per pt  ? CHF (congestive heart failure) ([place])   ? COVID-19   ? 2021, 12/2020   ? Diabetes type 2, uncontrolled   ? Heartburn   ? Hypertension   ? ?Past Surgical History:  ?Procedure Laterality Date  ? CHOLECYSTECTOMY  2008  ? COLONOSCOPY WITH PROPOFOL N/A 01/03/2017  ? Procedure: COLONOSCOPY WITH PROPOFOL;  Surgeon: Mauri Pole, MD;  Location: [facility];  Service: Endoscopy;  Laterality: N/A;  ? FOOT SURGERY  1982  ? "benign tumor bottom of right foot"  ? NASAL SEPTUM SURGERY  2000  ? Dr. Delma Freeze [facility] [place]   ? TONSILLECTOMY  2000  ? uterine ablation    ? UVULECTOMY  2000  ? ?Family History  ?Problem Relation Age of Onset  ? Diabetes Mother   ? Hypertension Mother   ? Cancer Father   ?     lung  ? Emphysema Father   ? Diabetes Sister   ? Diabetes Brother   ? Leukemia Brother   ? Stroke Brother   ? Heart attack Maternal Uncle   ? Breast cancer Paternal Aunt 97  ? Lymphoma Paternal Uncle   ? Polycystic ovary syndrome Daughter   ? Hypertension Daughter   ? Diabetes Mellitus II Maternal Grandmother   ? Colon cancer Neg Hx   ? ? ?Allergies: Keflex [cephalexin], Metformin and related, and Lactose intolerance (gi) ?Current Outpatient Medications on  File Prior to Visit  ?Medication Sig Dispense Refill  ? atorvastatin (LIPITOR) 10 MG tablet Take 1 tablet (10 mg total) by mouth daily. 90 tablet 3  ? buPROPion (WELLBUTRIN XL) 150 MG 24 hr tablet Take 1 tablet (150 mg total) by mouth daily with breakfast. 90 tablet 3  ? carvedilol (COREG) 6.25 MG tablet Take 1 tablet (6.25 mg total) by mouth 2 (two) times daily with a meal. 120 tablet 3  ? cetirizine (ZYRTEC) 10 MG tablet Take 10 mg by mouth daily.    ? furosemide (LASIX) 40 MG tablet Take 40 mg by mouth daily as needed for edema.     ? isosorbide mononitrate (IMDUR) 30 MG 24 hr tablet Take 1 tablet (30 mg total) by mouth at bedtime. 90 tablet 3  ? Lancets (ONETOUCH DELICA PLUS [id number]) MISC USE AS DIRECTED 100 each 0  ? lisinopril (ZESTRIL) 10 MG tablet Take 1 tablet (10 mg total) by mouth daily. 90 tablet 3  ? NEEDLE, DISP, 25 G (B-D DISP NEEDLE 25GX1") 25G X 1" MISC Used to give B-12 injections. 10 each 0  ? nitroGLYCERIN (NITROSTAT) 0.4 MG SL tablet     ? OZEMPIC, 1 MG/DOSE,  4 MG/3ML SOPN INJECT 1 MG UNDER THE SKIN ONCE A WEEK 3 mL 2  ? pantoprazole (PROTONIX) 40 MG tablet Take 1 tablet (40 mg total) by mouth daily as needed. TAKE 1 TABLET(40 MG) BY MOUTH DAILY 90 tablet 1  ? potassium chloride (KLOR-CON) 10 MEQ tablet Take 1 tablet (10 mEq total) by mouth daily as needed (swelling). 30 tablet 1  ? POTASSIUM PO Take 1 tablet by mouth daily.    ? SYRINGE/NEEDLE, DISP, 1 ML (BD ECLIPSE SYRINGE) 25G X 5/8" 1 ML MISC Used to give B-12 injections. 50 each 0  ? ?No current facility-administered medications on file prior to visit.  ? ? ?Social History  ? ?Tobacco Use  ? Smoking status: Former  ?  Packs/day: 0.25  ?  Years: 5.00  ?  Pack years: 1.25  ?  Types: Cigarettes  ?  Quit date: 11  ?  Years since quitting: 37.3  ? Smokeless tobacco: Never  ?Vaping Use  ? Vaping Use: Never used  ?Substance Use Topics  ? Alcohol use: Yes  ?  Alcohol/week: 1.0 standard drink  ?  Types: 1 Standard drinks or equivalent per week   ?  Comment: twice monthly  ? Drug use: No  ? ? ?Review of Systems  ?Constitutional:  Negative for chills and fever.  ?Respiratory:  Negative for cough and shortness of breath.   ?Cardiovascular:  Negative for chest pain, palpitations and leg swelling.  ?Gastrointestinal:  Negative for nausea and vomiting.  ?   ?Objective:  ?  ?BP 126/82 (BP Location: Left Arm, Patient Position: Sitting, Cuff Size: Normal)   Pulse 92   Temp 98.7 ?F (37.1 ?C) (Oral)   Ht '5\' 7"'$  (1.702 m)   Wt 182 lb 6.4 oz (82.7 kg)   LMP  (LMP Unknown)   SpO2 96%   BMI 28.57 kg/m?  ?BP Readings from Last 3 Encounters:  ?04/08/22 126/82  ?12/18/21 122/74  ?09/17/21 120/76  ? ?Wt Readings from Last 3 Encounters:  ?04/08/22 182 lb 6.4 oz (82.7 kg)  ?12/18/21 185 lb 9.6 oz (84.2 kg)  ?09/17/21 193 lb 6.4 oz (87.7 kg)  ? ? ?Physical Exam ?Vitals reviewed.  ?Constitutional:   ?   Appearance: She is well-developed.  ?Eyes:  ?   Conjunctiva/sclera: Conjunctivae normal.  ?Cardiovascular:  ?   Rate and Rhythm: Normal rate and regular rhythm.  ?   Pulses: Normal pulses.  ?   Heart sounds: Normal heart sounds.  ?Pulmonary:  ?   Effort: Pulmonary effort is normal.  ?   Breath sounds: Normal breath sounds. No wheezing, rhonchi or rales.  ?Musculoskeletal:  ?   Right lower leg: No edema.  ?   Left lower leg: No edema.  ?Skin: ?   General: Skin is warm and dry.  ?Neurological:  ?   Mental Status: She is alert.  ?Psychiatric:     ?   Speech: Speech normal.     ?   Behavior: Behavior normal.     ?   Thought Content: Thought content normal.  ? ? ?   ?Assessment & Plan:  ? ?Problem List Items Addressed This Visit   ? ?  ? Cardiovascular and Mediastinum  ? Hypertension  ?  Chronic stable. Continue Coreg 3.125 mg twice daily, lisinopril 7.5 mg ? ?  ?  ? Relevant Orders  ? CBC with Differential/Platelet  ? Comprehensive metabolic panel  ? NICM (nonischemic cardiomyopathy) ([place])  ?  Chronic, symptomatically stable. Due  for follow up with Dr Pasi 10/2022. Continue  imdur '30mg'$ . Rare use of lasix.  ? ?  ?  ?  ? Endocrine  ? Controlled diabetes mellitus type 2 with complications ([place])  ?  Pending a1c. Anticipate well controlled. Continue ozempic '1mg'$  ? ?  ?  ? Relevant Medications  ? cyanocobalamin (,VITAMIN B-12,) 1000 MCG/ML injection  ? Other Relevant Orders  ? Comprehensive metabolic panel  ? Hemoglobin A1c  ?  ? Other  ? B12 deficiency - Primary  ?  Continue b12 IM at home administer by sister who is CMA. Pending lab evaluation for thoroughness to r/o pernicious anemia, celiac anemia ? ?  ?  ? Relevant Orders  ? B12 and Folate Panel  ? Anti-parietal antibody  ? Celiac Disease Ab Screen w/Rfx  ? Homocysteine  ? Intrinsic Factor Antibodies  ? Fatigue  ? Relevant Orders  ? VITAMIN D 25 Hydroxy (Vit-D Deficiency, Fractures)  ? TSH  ? Hyperlipidemia  ?  Pending lipid panel. Anticipate controlled. Continue lipitor '10mg'$  ? ?  ?  ? Relevant Orders  ? Lipid panel  ? ?Other Visit Diagnoses   ? ? Screening for HIV (human immunodeficiency virus)      ? Relevant Orders  ? HIV Antibody (routine testing w rflx)  ? ?  ? ? ? ?I am having Salley Hews maintain her cetirizine, furosemide, pantoprazole, potassium chloride, OneTouch Delica Plus [id number], nitroGLYCERIN, POTASSIUM PO, B-D DISP NEEDLE 25GX1", BD Eclipse Syringe, atorvastatin, buPROPion, isosorbide mononitrate, lisinopril, carvedilol, Ozempic (1 MG/DOSE), and cyanocobalamin. ? ? ?Meds ordered this encounter  ?Medications  ? cyanocobalamin (,VITAMIN B-12,) 1000 MCG/ML injection  ?  Sig: 1000 mcg (1 mL) intramuscular injection in the thigh ( vastus lateralis) once per month.  ?  Dispense:  3 mL  ?  Refill:  4  ?  Order Specific Question:   Supervising Provider  ?  Answer:   Crecencio Mc [[id number]]  ? ? ?Return precautions given.  ? ?Risks, benefits, and alternatives of the medications and treatment plan prescribed today were discussed, and patient expressed understanding.  ? ?Education regarding symptom management and diagnosis given  to patient on AVS. ? ?Continue to follow with Burnard Hawthorne, FNP for routine health maintenance.  ? ?Salley Hews and I agreed with plan.  ? ?Mable Paris, FNP ? ? ?

## 2022-04-09 NOTE — Addendum Note (Signed)
Addended by: [place], Joycelyn Liska on: 04/09/2022 09:51 AM ? ? Modules accepted: Orders ? ?

## 2022-04-09 NOTE — Assessment & Plan Note (Addendum)
Continue b12 IM at home administer by sister who is CMA. Pending lab evaluation for thoroughness to r/o pernicious anemia, celiac anemia ?

## 2022-04-09 NOTE — Assessment & Plan Note (Signed)
Pending a1c. Anticipate well controlled. Continue ozempic '1mg'$  ?

## 2022-04-09 NOTE — Patient Instructions (Signed)
Nice to see you!   

## 2022-04-09 NOTE — Assessment & Plan Note (Signed)
Chronic, symptomatically stable. Due for follow up with Dr Pasi 10/2022. Continue imdur '30mg'$ . Rare use of lasix.  ?

## 2022-04-09 NOTE — Assessment & Plan Note (Signed)
Pending lipid panel. Anticipate controlled. Continue lipitor '10mg'$  ?

## 2022-04-09 NOTE — Assessment & Plan Note (Signed)
Chronic stable. Continue Coreg 3.125 mg twice daily, lisinopril 7.5 mg ?

## 2022-04-12 LAB — CELIAC DISEASE AB SCREEN W/RFX
Antigliadin Abs, IgA: 6 units (ref 0–19)
IgA/Immunoglobulin A, Serum: 311 mg/dL (ref 87–352)
Transglutaminase IgA: <2 U/mL (ref 0–3)

## 2022-04-14 LAB — ANTI-PARIETAL ANTIBODY: PARIETAL CELL AB SCREEN: NEGATIVE

## 2022-04-14 LAB — INTRINSIC FACTOR ANTIBODIES: Intrinsic Factor: NEGATIVE

## 2022-04-14 LAB — HOMOCYSTEINE: Homocysteine: 11.6 umol/L — ABNORMAL HIGH (ref <10.4)

## 2022-04-14 LAB — HIV ANTIBODY (ROUTINE TESTING W REFLEX): HIV 1&2 Ab, 4th Generation: NONREACTIVE

## 2022-04-15 ENCOUNTER — Other Ambulatory Visit: Payer: Self-pay | Admitting: Family

## 2022-04-15 DIAGNOSIS — E559 Vitamin D deficiency, unspecified: Secondary | ICD-10-CM

## 2022-04-15 MED ORDER — CHOLECALCIFEROL 1.25 MG (50000 UT) PO TABS: ORAL_TABLET | ORAL | 0 refills | Status: DC

## 2022-04-25 ENCOUNTER — Telehealth: Payer: Self-pay

## 2022-04-25 ENCOUNTER — Telehealth: Payer: Self-pay | Admitting: Family

## 2022-04-25 NOTE — Telephone Encounter (Signed)
Katrina Kelly yes your primary diagnosis is Vitamin D deficiency. It is noted in your chart. ?

## 2022-04-25 NOTE — Telephone Encounter (Signed)
Called patient back and explained to her that she does have vitamin D deficiency and she should pick up her prescription. ?

## 2022-04-25 NOTE — Telephone Encounter (Signed)
LMTCB to get results ?

## 2022-04-25 NOTE — Telephone Encounter (Signed)
Patient returned office phone call. Lab note from [facility] was read. Patient went to pick up medication from pharmacy, [place] ordered her vitam D. Patient would like to know if her vitam D is low before she picks that prescription up.  ?

## 2022-07-17 ENCOUNTER — Encounter: Payer: Self-pay | Admitting: Family

## 2022-07-17 ENCOUNTER — Ambulatory Visit (INDEPENDENT_AMBULATORY_CARE_PROVIDER_SITE_OTHER): Payer: Managed Care, Other (non HMO) | Admitting: Family

## 2022-07-17 VITALS — BP 122/70 | HR 82 | Temp 97.9°F | Ht 67.0 in | Wt 184.8 lb

## 2022-07-17 DIAGNOSIS — I10 Essential (primary) hypertension: Secondary | ICD-10-CM | POA: Diagnosis not present

## 2022-07-17 DIAGNOSIS — E118 Type 2 diabetes mellitus with unspecified complications: Secondary | ICD-10-CM | POA: Diagnosis not present

## 2022-07-17 LAB — POCT GLYCOSYLATED HEMOGLOBIN (HGB A1C): Hemoglobin A1C: 5.5 % (ref 4.0–5.6)

## 2022-07-17 NOTE — Progress Notes (Signed)
Subjective:    Patient ID: Katrina Kelly, female    DOB: [birth year], 61 y.o.   MRN: [id number]  CC: Katrina Kelly is a 61 y.o. female who presents today for follow up.   HPI: Feels well today.  No new complaints  HTN- compliant with Coreg 3.125 mg twice daily, lisinopril 7.5 mg DM- eye exam this week. Compliant with ozempic '1mg'$ .  Tolerating Ozempic well   HISTORY:  Past Medical History:  Diagnosis Date  . Allergy   . Anxiety   . Basal cell carcinoma    skin   . Cardiomyopathy ([place])    EF 35% per pt  . CHF (congestive heart failure) ([place])   . COVID-19    2021, 12/2020   . Diabetes type 2, uncontrolled   . Heartburn   . Hypertension    Past Surgical History:  Procedure Laterality Date  . CHOLECYSTECTOMY  2008  . COLONOSCOPY WITH PROPOFOL N/A 01/03/2017   Procedure: COLONOSCOPY WITH PROPOFOL;  Surgeon: Mauri Pole, MD;  [facility];  Service: Endoscopy;  Laterality: N/A;  . FOOT SURGERY  1982   "benign tumor bottom of right foot"  . NASAL SEPTUM SURGERY  2000   Dr. Delma Freeze [facility]NT [facility]  . TONSILLECTOMY  2000  . uterine ablation    . UVULECTOMY  2000   Family History  Problem Relation Age of Onset  . Diabetes Mother   . Hypertension Mother   . Cancer Father        lung  . Emphysema Father   . Diabetes Sister   . Diabetes Brother   . Leukemia Brother   . Stroke Brother   . Heart attack Maternal Uncle   . Breast cancer Paternal Aunt 79  . Lymphoma Paternal Uncle   . Polycystic ovary syndrome Daughter   . Hypertension Daughter   . Diabetes Mellitus II Maternal Grandmother   . Colon cancer Neg Hx     Allergies: Keflex [cephalexin], Metformin and related, and Lactose intolerance (gi) Current Outpatient Medications on File Prior to Visit  Medication Sig Dispense Refill  . atorvastatin (LIPITOR) 10 MG tablet Take 1 tablet (10 mg total) by mouth daily. 90 tablet 3  . buPROPion (WELLBUTRIN XL) 150 MG 24 hr tablet Take 1 tablet (150 mg total) by  mouth daily with breakfast. 90 tablet 3  . carvedilol (COREG) 6.25 MG tablet Take 1 tablet (6.25 mg total) by mouth 2 (two) times daily with a meal. 120 tablet 3  . cetirizine (ZYRTEC) 10 MG tablet Take 10 mg by mouth daily.    . Cholecalciferol 1.25 MG (50000 UT) TABS 50,000 units PO qwk for 8 weeks. 8 tablet 0  . cyanocobalamin (,VITAMIN B-12,) 1000 MCG/ML injection 1000 mcg (1 mL) intramuscular injection in the thigh ( vastus lateralis) once per month. 3 mL 4  . furosemide (LASIX) 40 MG tablet Take 40 mg by mouth daily as needed for edema.     . isosorbide mononitrate (IMDUR) 30 MG 24 hr tablet Take 1 tablet (30 mg total) by mouth at bedtime. 90 tablet 3  . Lancets (ONETOUCH DELICA PLUS [id number]) MISC USE AS DIRECTED 100 each 0  . lisinopril (ZESTRIL) 10 MG tablet Take 1 tablet (10 mg total) by mouth daily. 90 tablet 3  . NEEDLE, DISP, 25 G (B-D DISP NEEDLE 25GX1") 25G X 1" MISC Used to give B-12 injections. 10 each 0  . nitroGLYCERIN (NITROSTAT) 0.4 MG SL tablet     . [place], 1  MG/DOSE, 4 MG/3ML SOPN INJECT 1 MG UNDER THE SKIN ONCE A WEEK 3 mL 2  . pantoprazole (PROTONIX) 40 MG tablet Take 1 tablet (40 mg total) by mouth daily as needed. TAKE 1 TABLET(40 MG) BY MOUTH DAILY 90 tablet 1  . potassium chloride (KLOR-CON) 10 MEQ tablet Take 1 tablet (10 mEq total) by mouth daily as needed (swelling). 30 tablet 1  . POTASSIUM PO Take 1 tablet by mouth daily.    . SYRINGE/NEEDLE, DISP, 1 ML (BD ECLIPSE SYRINGE) 25G X 5/8" 1 ML MISC Used to give B-12 injections. 50 each 0   No current facility-administered medications on file prior to visit.    Social History   Tobacco Use  . Smoking status: Former    Packs/day: 0.25    Years: 5.00    Total pack years: 1.25    Types: Cigarettes    Quit date: 1986    Years since quitting: 37.6  . Smokeless tobacco: Never  Vaping Use  . Vaping Use: Never used  Substance Use Topics  . Alcohol use: Yes    Alcohol/week: 1.0 standard drink of alcohol     Types: 1 Standard drinks or equivalent per week    Comment: twice monthly  . Drug use: No    Review of Systems  Constitutional:  Negative for chills and fever.  Respiratory:  Negative for cough.   Cardiovascular:  Negative for chest pain and palpitations.  Gastrointestinal:  Negative for nausea and vomiting.      Objective:    BP 122/70 (BP Location: Left Arm, Patient Position: Sitting, Cuff Size: Normal)   Pulse 82   Temp 97.9 F (36.6 C) (Oral)   Ht '5\' 7"'$  (1.702 m)   Wt 184 lb 12.8 oz (83.8 kg)   LMP  (LMP Unknown)   SpO2 97%   BMI 28.94 kg/m  BP Readings from Last 3 Encounters:  07/17/22 122/70  04/08/22 126/82  12/18/21 122/74   Wt Readings from Last 3 Encounters:  07/17/22 184 lb 12.8 oz (83.8 kg)  04/08/22 182 lb 6.4 oz (82.7 kg)  12/18/21 185 lb 9.6 oz (84.2 kg)    Physical Exam Vitals reviewed.  Constitutional:      Appearance: She is well-developed.  Eyes:     Conjunctiva/sclera: Conjunctivae normal.  Cardiovascular:     Rate and Rhythm: Normal rate and regular rhythm.     Pulses: Normal pulses.     Heart sounds: Normal heart sounds.  Pulmonary:     Effort: Pulmonary effort is normal.     Breath sounds: Normal breath sounds. No wheezing, rhonchi or rales.  Musculoskeletal:     Right lower leg: No edema.     Left lower leg: No edema.  Skin:    General: Skin is warm and dry.  Neurological:     Mental Status: She is alert.  Psychiatric:        Speech: Speech normal.        Behavior: Behavior normal.        Thought Content: Thought content normal.       Assessment & Plan:   Problem List Items Addressed This Visit       Cardiovascular and Mediastinum   Hypertension    Chronic, stable.  Continue Coreg 3.125 mg twice daily, lisinopril 7.5 mg        Endocrine   Controlled diabetes mellitus type 2 with complications [facility]) - Primary    Lab Results  Component Value Date  HGBA1C 5.5 07/17/2022  Excellent control.  Advised patient she may see  me twice a year to recheck A1c.  Continue Ozempic 1 mg for maintenance      Relevant Orders   POCT HgB A1C (Completed)     I am having Salley Hews maintain her cetirizine, furosemide, pantoprazole, potassium chloride, OneTouch Delica Plus [id number], nitroGLYCERIN, POTASSIUM PO, B-D DISP NEEDLE 25GX1", BD Eclipse Syringe, atorvastatin, buPROPion, isosorbide mononitrate, lisinopril, carvedilol, Ozempic (1 MG/DOSE), cyanocobalamin, and Cholecalciferol.   No orders of the defined types were placed in this encounter.   Return precautions given.   Risks, benefits, and alternatives of the medications and treatment plan prescribed today were discussed, and patient expressed understanding.   Education regarding symptom management and diagnosis given to patient on AVS.  Continue to follow with Burnard Hawthorne, FNP for routine health maintenance.   Salley Hews and I agreed with plan.   Mable Paris, FNP

## 2022-07-17 NOTE — Assessment & Plan Note (Signed)
Lab Results  Component Value Date   HGBA1C 5.5 07/17/2022   Excellent control.  Advised patient she may see me twice a year to recheck A1c.  Continue Ozempic 1 mg for maintenance

## 2022-07-17 NOTE — Progress Notes (Signed)
Had eye exam on mon.Marland Kitchen A1C was done on mon as well

## 2022-07-17 NOTE — Assessment & Plan Note (Signed)
Chronic, stable.  Continue Coreg 3.125 mg twice daily, lisinopril 7.5 mg

## 2022-07-23 ENCOUNTER — Other Ambulatory Visit: Payer: Self-pay | Admitting: Family

## 2022-07-23 DIAGNOSIS — E118 Type 2 diabetes mellitus with unspecified complications: Secondary | ICD-10-CM

## 2022-07-30 ENCOUNTER — Other Ambulatory Visit: Payer: Self-pay

## 2022-07-30 ENCOUNTER — Telehealth: Payer: Self-pay | Admitting: Family

## 2022-07-30 DIAGNOSIS — I10 Essential (primary) hypertension: Secondary | ICD-10-CM

## 2022-07-30 DIAGNOSIS — E118 Type 2 diabetes mellitus with unspecified complications: Secondary | ICD-10-CM

## 2022-07-30 MED ORDER — ISOSORBIDE MONONITRATE ER 30 MG PO TB24: 30.0000 mg | ORAL_TABLET | Freq: Every day | ORAL | 3 refills | Status: DC

## 2022-07-30 MED ORDER — OZEMPIC (1 MG/DOSE) 4 MG/3ML ~~LOC~~ SOPN: PEN_INJECTOR | SUBCUTANEOUS | 2 refills | Status: DC

## 2022-07-30 NOTE — Telephone Encounter (Signed)
Patient is requsting a refill on OZEMPIC, 1 MG/DOSE, 4 MG/3ML SOPN and isosorbide mononitrate (IMDUR) 30 MG 24 hr tablet.

## 2022-07-30 NOTE — Telephone Encounter (Signed)
Refills sent to pharmacy and patient notified as well

## 2022-07-31 ENCOUNTER — Other Ambulatory Visit: Payer: Self-pay

## 2022-07-31 DIAGNOSIS — E118 Type 2 diabetes mellitus with unspecified complications: Secondary | ICD-10-CM

## 2022-07-31 MED ORDER — OZEMPIC (1 MG/DOSE) 4 MG/3ML ~~LOC~~ SOPN: PEN_INJECTOR | SUBCUTANEOUS | 2 refills | Status: DC

## 2022-08-06 ENCOUNTER — Encounter: Payer: Self-pay | Admitting: Family

## 2022-08-09 NOTE — Telephone Encounter (Signed)
Sent patien ta My chart advising to apply for the [employer] card through the [employer] site.

## 2022-09-25 ENCOUNTER — Other Ambulatory Visit: Payer: Self-pay | Admitting: Family

## 2022-09-25 DIAGNOSIS — I10 Essential (primary) hypertension: Secondary | ICD-10-CM

## 2022-09-25 NOTE — Telephone Encounter (Signed)
Rx sent in and pt aware

## 2022-11-04 ENCOUNTER — Other Ambulatory Visit: Payer: Self-pay | Admitting: Family

## 2022-11-04 DIAGNOSIS — R5383 Other fatigue: Secondary | ICD-10-CM

## 2022-11-04 DIAGNOSIS — F320 Major depressive disorder, single episode, mild: Secondary | ICD-10-CM

## 2022-12-29 ENCOUNTER — Other Ambulatory Visit: Payer: Self-pay | Admitting: Family

## 2022-12-29 DIAGNOSIS — E118 Type 2 diabetes mellitus with unspecified complications: Secondary | ICD-10-CM

## 2023-01-07 ENCOUNTER — Other Ambulatory Visit: Payer: Self-pay | Admitting: Family

## 2023-02-05 ENCOUNTER — Other Ambulatory Visit: Payer: Self-pay | Admitting: Family

## 2023-02-05 DIAGNOSIS — E118 Type 2 diabetes mellitus with unspecified complications: Secondary | ICD-10-CM

## 2023-02-18 ENCOUNTER — Telehealth: Payer: Self-pay | Admitting: Family

## 2023-02-18 ENCOUNTER — Encounter: Payer: Self-pay | Admitting: Family

## 2023-02-18 ENCOUNTER — Ambulatory Visit: Payer: PRIVATE HEALTH INSURANCE | Admitting: Family

## 2023-02-18 VITALS — BP 120/82 | HR 81 | Temp 97.7°F | Ht 67.0 in | Wt 194.4 lb

## 2023-02-18 DIAGNOSIS — M5442 Lumbago with sciatica, left side: Secondary | ICD-10-CM | POA: Diagnosis not present

## 2023-02-18 DIAGNOSIS — G8929 Other chronic pain: Secondary | ICD-10-CM

## 2023-02-18 DIAGNOSIS — M545 Low back pain, unspecified: Secondary | ICD-10-CM | POA: Insufficient documentation

## 2023-02-18 DIAGNOSIS — Z1231 Encounter for screening mammogram for malignant neoplasm of breast: Secondary | ICD-10-CM

## 2023-02-18 DIAGNOSIS — E118 Type 2 diabetes mellitus with unspecified complications: Secondary | ICD-10-CM

## 2023-02-18 LAB — COMPREHENSIVE METABOLIC PANEL
ALT: 19 U/L (ref 0–35)
AST: 17 U/L (ref 0–37)
Albumin: 3.8 g/dL (ref 3.5–5.2)
Alkaline Phosphatase: 70 U/L (ref 39–117)
BUN: 14 mg/dL (ref 6–23)
CO2: 23 mEq/L (ref 19–32)
Calcium: 9.2 mg/dL (ref 8.4–10.5)
Chloride: 107 mEq/L (ref 96–112)
Creatinine, Ser: 1.04 mg/dL (ref 0.40–1.20)
GFR: 58.03 mL/min — ABNORMAL LOW (ref >60.00)
Glucose, Bld: 112 mg/dL — ABNORMAL HIGH (ref 70–99)
Potassium: 3.9 mEq/L (ref 3.5–5.1)
Sodium: 140 mEq/L (ref 135–145)
Total Bilirubin: 0.5 mg/dL (ref 0.2–1.2)
Total Protein: 7 g/dL (ref 6.0–8.3)

## 2023-02-18 LAB — LIPID PANEL
Cholesterol: 122 mg/dL (ref 0–200)
HDL: 41.6 mg/dL (ref >39.00)
LDL Cholesterol: 51 mg/dL (ref 0–99)
NonHDL: 80.85
Total CHOL/HDL Ratio: 3
Triglycerides: 148 mg/dL (ref 0.0–149.0)
VLDL: 29.6 mg/dL (ref 0.0–40.0)

## 2023-02-18 LAB — MICROALBUMIN / CREATININE URINE RATIO
Creatinine,U: 217.1 mg/dL
Microalb Creat Ratio: 0.7 mg/g (ref 0.0–30.0)
Microalb, Ur: 1.4 mg/dL (ref 0.0–1.9)

## 2023-02-18 LAB — HEMOGLOBIN A1C: Hgb A1c MFr Bld: 6.4 % (ref 4.6–6.5)

## 2023-02-18 NOTE — Assessment & Plan Note (Signed)
Pending A1c and anticipate well-controlled.  Refilled Ozempic 1 mg

## 2023-02-18 NOTE — Progress Notes (Signed)
Assessment & Plan:  Chronic bilateral low back pain with left-sided sciatica Assessment & Plan: Acute on chronic.  Anticipate degenerative disc disease, or SI joint dysfunction playing a role.  Suspect worsened by a number of hours patient is sitting for work and in her car.  Previously had x-rays done with chiropractor however unable to see these.  In the setting of radicular symptoms, ordered MRI lumbar spine.  Patient declines x-rays of bilateral hips at this time.  Will consider these at follow-up.  Referral to Dr. Holley Raring.  She will continue Advil over-the-counter.  She politely declines gabapentin.  Orders: -     MR LUMBAR SPINE WO CONTRAST; Future -     Ambulatory referral to Pain Clinic  Encounter for screening mammogram for malignant neoplasm of breast  Controlled type 2 diabetes mellitus with complication, without long-term current use of insulin ([place]) Assessment & Plan: Pending A1c and anticipate well-controlled.  Refilled Ozempic 1 mg  Orders: -     Hemoglobin A1c; Future -     Microalbumin / creatinine urine ratio; Future -     Comprehensive metabolic panel; Future -     Lipid panel; Future     Return precautions given.   Risks, benefits, and alternatives of the medications and treatment plan prescribed today were discussed, and patient expressed understanding.   Education regarding symptom management and diagnosis given to patient on AVS either electronically or printed.  Return in about 4 months (around 06/20/2023) for Fasting labs in 2-3 weeks.  Mable Paris, FNP  Subjective:    Patient ID: Katrina Kelly, female    DOB: 1961/10/09, 62 y.o.   MRN: [id number]  CC: Katrina Kelly is a 62 y.o. female who presents today for follow up.   HPI: Complains of low back pain x 5 months, improved. Pain improves with activity, worse with 'sitting all day' such as at work.  She sits for 10 hours a day.  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    Numbness left leg to foot.   She has been seeing chiropracter with some relief. She reports she has had xrays with chiropractor.  No h/o cancer  No saddle anesthesia, urinary or fecal incontinence.     Mammogram is due; she prefers to have done with GYN, [facility].   Allergies: Keflex [cephalexin], Metformin and related, and Lactose intolerance (gi) Current Outpatient Medications on File Prior to Visit  Medication Sig Dispense Refill   atorvastatin (LIPITOR) 10 MG tablet TAKE 1 TABLET(10 MG) BY MOUTH DAILY 90 tablet 3   buPROPion (WELLBUTRIN XL) 150 MG 24 hr tablet TAKE 1 TABLET(150 MG) BY MOUTH DAILY WITH BREAKFAST 90 tablet 3   carvedilol (COREG) 6.25 MG tablet TAKE 1 TABLET(6.25 MG) BY MOUTH TWICE DAILY WITH A MEAL 120 tablet 3   cetirizine (ZYRTEC) 10 MG tablet Take 10 mg by mouth daily.     Cholecalciferol 1.25 MG (50000 UT) TABS 50,000 units PO qwk for 8 weeks. 8 tablet 0   cyanocobalamin (,VITAMIN B-12,) 1000 MCG/ML injection 1000 mcg (1 mL) intramuscular injection in the thigh ( vastus lateralis) once per month. 3 mL 4   furosemide (LASIX) 40 MG tablet Take 40 mg by mouth daily as needed for edema.      isosorbide mononitrate (IMDUR) 30 MG 24 hr tablet Take 1 tablet (30 mg total) by mouth at bedtime. 90 tablet 3   Lancets (ONETOUCH DELICA PLUS [id number]) MISC USE AS DIRECTED 100  each 0   lisinopril (ZESTRIL) 10 MG tablet TAKE 1 TABLET(10 MG) BY MOUTH DAILY 90 tablet 3   NEEDLE, DISP, 25 G (B-D DISP NEEDLE 25GX1") 25G X 1" MISC Used to give B-12 injections. 10 each 0   nitroGLYCERIN (NITROSTAT) 0.4 MG SL tablet      pantoprazole (PROTONIX) 40 MG tablet Take 1 tablet (40 mg total) by mouth daily as needed. TAKE 1 TABLET(40 MG) BY MOUTH DAILY 90 tablet 1   potassium chloride (KLOR-CON) 10 MEQ tablet Take 1 tablet (10 mEq total) by mouth daily as needed (swelling). 30 tablet 1   POTASSIUM PO Take 1 tablet by mouth daily.     Semaglutide, 1 MG/DOSE, (OZEMPIC, 1 MG/DOSE,) 4 MG/3ML  SOPN INJECT 1 MG UNDER THE SKIN ONE DAY A WEEK 3 mL 2   SYRINGE/NEEDLE, DISP, 1 ML (BD ECLIPSE SYRINGE) 25G X 5/8" 1 ML MISC Used to give B-12 injections. 50 each 0   No current facility-administered medications on file prior to visit.    Review of Systems  Constitutional:  Negative for chills and fever.  Respiratory:  Negative for cough.   Cardiovascular:  Negative for chest pain and palpitations.  Gastrointestinal:  Negative for nausea and vomiting.  Musculoskeletal:  Positive for back pain.  Neurological:  Positive for numbness.      Objective:    BP 120/82   Pulse 81   Temp 97.7 F (36.5 C) (Oral)   Ht '5\' 7"'$  (1.702 m)   Wt 194 lb 6.4 oz (88.2 kg)   SpO2 97%   BMI 30.45 kg/m  BP Readings from Last 3 Encounters:  02/18/23 120/82  07/17/22 122/70  04/08/22 126/82   Wt Readings from Last 3 Encounters:  02/18/23 194 lb 6.4 oz (88.2 kg)  07/17/22 184 lb 12.8 oz (83.8 kg)  04/08/22 182 lb 6.4 oz (82.7 kg)    Physical Exam Vitals reviewed.  Constitutional:      Appearance: She is well-developed.  Eyes:     Conjunctiva/sclera: Conjunctivae normal.  Cardiovascular:     Rate and Rhythm: Normal rate and regular rhythm.     Pulses: Normal pulses.     Heart sounds: Normal heart sounds.  Pulmonary:     Effort: Pulmonary effort is normal.     Breath sounds: Normal breath sounds. No wheezing, rhonchi or rales.  Musculoskeletal:     Lumbar back: Tenderness present. No swelling, edema, spasms or bony tenderness. Normal range of motion. Negative right straight leg raise test and negative left straight leg raise test.       Back:     Comments: Full range of motion with flexion, tension, lateral side bends. No bony tenderness. No pain, numbness, tingling elicited with single leg raise bilaterally.  Tenderness over left SI joint.  Skin:    General: Skin is warm and dry.  Neurological:     Mental Status: She is alert.     Sensory: No sensory deficit.     Deep Tendon Reflexes:      Reflex Scores:      Patellar reflexes are 2+ on the right side and 2+ on the left side.    Comments: Sensation and strength intact bilateral lower extremities.  Psychiatric:        Speech: Speech normal.        Behavior: Behavior normal.        Thought Content: Thought content normal.

## 2023-02-18 NOTE — Assessment & Plan Note (Addendum)
Acute on chronic.  Anticipate degenerative disc disease, or SI joint dysfunction playing a role.  Suspect worsened by a number of hours patient is sitting for work and in her car.  Previously had x-rays done with chiropractor however unable to see these.  In the setting of radicular symptoms, ordered MRI lumbar spine.  Patient declines x-rays of bilateral hips at this time.  Will consider these at follow-up.  Referral to Dr. Holley Raring.  She will continue Advil over-the-counter.  She politely declines gabapentin.

## 2023-02-18 NOTE — Patient Instructions (Signed)
Referral to pain management, Dr. Holley Raring. Let us know if you dont hear back within a week in regards to an appointment being scheduled.   So that you are aware, if you are [facility] MyChart user , please pay attention to your MyChart messages as you may receive a MyChart message with a phone number to call and schedule this test/appointment own your own from our referral coordinator. This is a new process so I do not want you to miss this message.  If you are not a MyChart user, you will receive a phone call.

## 2023-02-18 NOTE — Telephone Encounter (Signed)
Lft pt vm to call ofc to sch MRI. thanks

## 2023-02-21 ENCOUNTER — Encounter: Payer: Self-pay | Admitting: Family

## 2023-02-24 ENCOUNTER — Other Ambulatory Visit: Payer: Self-pay

## 2023-02-24 ENCOUNTER — Telehealth: Payer: Self-pay

## 2023-02-24 ENCOUNTER — Other Ambulatory Visit (HOSPITAL_COMMUNITY): Payer: Self-pay

## 2023-02-24 DIAGNOSIS — E118 Type 2 diabetes mellitus with unspecified complications: Secondary | ICD-10-CM

## 2023-02-24 MED ORDER — "BD DISP NEEDLE 25G X 1"" MISC": 0 refills | Status: AC

## 2023-02-24 MED ORDER — CYANOCOBALAMIN 1000 MCG/ML IJ SOLN: INTRAMUSCULAR | 4 refills | Status: DC

## 2023-02-24 NOTE — Telephone Encounter (Signed)
PA Ozempic 

## 2023-02-24 NOTE — Telephone Encounter (Signed)
Pharmacy Patient Advocate Encounter  Prior authorization is required for Ozempic (1 MG/DOSE) '4MG'$ /3ML pen-injectors. PA submitted and APPROVED on 02/24/23.  Key [id number] Effective: 01/25/23 - 02/24/24

## 2023-02-26 ENCOUNTER — Ambulatory Visit: Payer: PRIVATE HEALTH INSURANCE

## 2023-02-26 ENCOUNTER — Other Ambulatory Visit: Payer: Self-pay | Admitting: Family

## 2023-02-26 DIAGNOSIS — E569 Vitamin deficiency, unspecified: Secondary | ICD-10-CM

## 2023-02-26 MED ORDER — CHOLECALCIFEROL 1.25 MG (50000 UT) PO TABS: ORAL_TABLET | ORAL | 0 refills | Status: DC

## 2023-03-02 ENCOUNTER — Ambulatory Visit: Admission: RE | Admit: 2023-03-02 | Payer: PRIVATE HEALTH INSURANCE | Source: Ambulatory Visit

## 2023-03-13 ENCOUNTER — Ambulatory Visit: Payer: PRIVATE HEALTH INSURANCE

## 2023-04-04 ENCOUNTER — Ambulatory Visit
Admission: RE | Admit: 2023-04-04 | Discharge: 2023-04-04 | Disposition: A | Discharge status: Home / Self Care | Payer: Managed Care, Other (non HMO) | Source: Ambulatory Visit | Attending: Family | Admitting: Family

## 2023-04-04 DIAGNOSIS — M5442 Lumbago with sciatica, left side: Secondary | ICD-10-CM | POA: Insufficient documentation

## 2023-04-04 DIAGNOSIS — G8929 Other chronic pain: Secondary | ICD-10-CM | POA: Diagnosis present

## 2023-04-07 ENCOUNTER — Encounter: Payer: Self-pay | Admitting: Family

## 2023-04-08 ENCOUNTER — Other Ambulatory Visit: Payer: Self-pay | Admitting: Family

## 2023-04-08 DIAGNOSIS — G8929 Other chronic pain: Secondary | ICD-10-CM

## 2023-04-16 ENCOUNTER — Other Ambulatory Visit: Payer: Self-pay | Admitting: Family

## 2023-04-18 NOTE — Telephone Encounter (Signed)
Spoke to patient and she is not sure if you want her to continue with the vitamin D or not.

## 2023-05-04 ENCOUNTER — Other Ambulatory Visit: Payer: Self-pay | Admitting: Family

## 2023-05-04 DIAGNOSIS — I10 Essential (primary) hypertension: Secondary | ICD-10-CM

## 2023-05-06 ENCOUNTER — Encounter: Payer: Self-pay | Admitting: Student in an Organized Health Care Education/Training Program

## 2023-05-06 ENCOUNTER — Ambulatory Visit
Payer: Managed Care, Other (non HMO) | Attending: Student in an Organized Health Care Education/Training Program | Admitting: Student in an Organized Health Care Education/Training Program

## 2023-05-06 VITALS — BP 116/83 | HR 94 | Temp 97.3°F | Resp 18 | Ht 67.0 in | Wt 190.0 lb

## 2023-05-06 DIAGNOSIS — M5136 Other intervertebral disc degeneration, lumbar region: Secondary | ICD-10-CM | POA: Insufficient documentation

## 2023-05-06 DIAGNOSIS — G894 Chronic pain syndrome: Secondary | ICD-10-CM | POA: Diagnosis present

## 2023-05-06 DIAGNOSIS — M47816 Spondylosis without myelopathy or radiculopathy, lumbar region: Secondary | ICD-10-CM | POA: Diagnosis present

## 2023-05-06 NOTE — Progress Notes (Signed)
Patient: Katrina Kelly  Service Category: E/M  Provider: Edward Jolly, MD  DOB: Jul 05, 1961  DOS: 05/06/2023  Referring Provider: Allegra Grana, FNP  MRN: [id number]  Setting: Ambulatory outpatient  PCP: Allegra Grana, FNP  Type: New Patient  Specialty: Interventional Pain Management    Location: Office  Delivery: Face-to-face     Primary Reason(s) for Visit: Encounter for initial evaluation of one or more chronic problems (new to examiner) potentially causing chronic pain, and posing a threat to normal musculoskeletal function. (Level of risk: High) CC: Back Pain (left)  HPI  Katrina Kelly is a 62 y.o. year old, female patient, who comes for the first time to our practice referred by Allegra Grana, FNP for our initial evaluation of her chronic pain. She has GERD (gastroesophageal reflux disease); NICM (nonischemic cardiomyopathy) (HCC); Environmental allergies; History of skin cancer; Controlled diabetes mellitus type 2 with complications (HCC); Carpal tunnel syndrome; Polyp of descending colon; Polyp of rectum; Hyperlipidemia; Peripheral neuropathy; Situational stress; Depression, major, single episode, mild (HCC); Fatigue; Hypertension; B12 deficiency; Loud snoring; OSA (obstructive sleep apnea); Sinusitis; Low back pain; Lumbar facet arthropathy; Lumbar degenerative disc disease; and Chronic pain syndrome on their problem list. Today she comes in for evaluation of her Back Pain (left)  Pain Assessment: Location: Lower, Left Back Radiating: Radiates from left lower back to outer leg down to ankle Onset: More than a month ago Duration: Chronic pain Quality: Aching, Constant, Sharp Severity: 6 /10 (subjective, self-reported pain score)  Effect on ADL: "Pain wakes me up from sleep sometimes and pain is worse in the morning. Laying down and reclining position makes it worse" Timing: Constant Modifying factors: "Tylenol extra strength every monring, celebrex, position change where hip is  lower than knees" BP: 116/83  HR: 94  Onset and Duration: Date of onset: Sept/Oct 2023 and Present longer than 3 months Cause of pain: Unknown Severity: NAS-11 at its worse: 8/10, NAS-11 at its best: 0/10, NAS-11 now: 5/10, and NAS-11 on the average: 3/10 Timing: Morning and After activity or exercise Aggravating Factors: Prolonged sitting and Sleeping Alleviating Factors: Hot packs, Medications, Standing, Warm showers or baths, and Walking Associated Problems: Pain that wakes patient up Quality of Pain: Aching, Constant, Distressing, Dreadful, Nagging, Throbbing, and Toothache-like Previous Examinations or Tests: MRI scan and Chiropractic evaluation Previous Treatments: Chiropractic manipulations  Katrina Kelly is being evaluated for possible interventional pain management therapies for the treatment of her chronic pain.   Katrina Kelly is a pleasant 62 year old female who presents with a chief complaint of bilateral low back pain with occasional radiation into her left buttock and left lateral thigh.  This started last September when she was remodeling her bathroom.  She has seen a chiropractor for 16 weeks and has also done lumbar stretching exercises that have been recommended to her in the past.  She takes acetaminophen and also takes Celebrex 200 mg daily which provides some pain relief.  She states that her mother also had back issues around this time.  She is a type II diabetic.  She states that her pain is worse with lumbar extension and when she is laying down but gets better during the day as she is moving around.    Meds   Current Outpatient Medications:    acetaminophen (TYLENOL) 500 MG tablet, Take 500 mg by mouth every 6 (six) hours as needed., Disp: , Rfl:    atorvastatin (LIPITOR) 10 MG tablet, TAKE 1 TABLET(10 MG) BY MOUTH DAILY, Disp: 90 tablet,  Rfl: 3   buPROPion (WELLBUTRIN XL) 150 MG 24 hr tablet, TAKE 1 TABLET(150 MG) BY MOUTH DAILY WITH BREAKFAST, Disp: 90 tablet, Rfl: 3    carvedilol (COREG) 6.25 MG tablet, TAKE 1 TABLET(6.25 MG) BY MOUTH TWICE DAILY WITH A MEAL, Disp: 120 tablet, Rfl: 3   celecoxib (CELEBREX) 200 MG capsule, Take 200 mg by mouth daily., Disp: , Rfl:    cetirizine (ZYRTEC) 10 MG tablet, Take 10 mg by mouth daily., Disp: , Rfl:    Cholecalciferol 1.25 MG (50000 UT) TABS, 50,000 units PO qwk for 8 weeks., Disp: 8 tablet, Rfl: 0   cyanocobalamin (VITAMIN B12) 1000 MCG/ML injection, 1000 mcg (1 mL) intramuscular injection in the thigh ( vastus lateralis) once per month., Disp: 3 mL, Rfl: 4   furosemide (LASIX) 40 MG tablet, Take 40 mg by mouth daily as needed for edema. , Disp: , Rfl:    isosorbide mononitrate (IMDUR) 30 MG 24 hr tablet, Take 1 tablet (30 mg total) by mouth at bedtime., Disp: 90 tablet, Rfl: 3   Lancets (ONETOUCH DELICA PLUS LANCET30G) MISC, USE AS DIRECTED, Disp: 100 each, Rfl: 0   lisinopril (ZESTRIL) 10 MG tablet, TAKE 1 TABLET(10 MG) BY MOUTH DAILY, Disp: 90 tablet, Rfl: 3   NEEDLE, DISP, 25 G (B-D DISP NEEDLE 25GX1") 25G X 1" MISC, Used to give B-12 injections., Disp: 10 each, Rfl: 0   nitroGLYCERIN (NITROSTAT) 0.4 MG SL tablet, , Disp: , Rfl:    pantoprazole (PROTONIX) 40 MG tablet, Take 1 tablet (40 mg total) by mouth daily as needed. TAKE 1 TABLET(40 MG) BY MOUTH DAILY, Disp: 90 tablet, Rfl: 1   potassium chloride (KLOR-CON) 10 MEQ tablet, Take 1 tablet (10 mEq total) by mouth daily as needed (swelling)., Disp: 30 tablet, Rfl: 1   POTASSIUM PO, Take 1 tablet by mouth daily., Disp: , Rfl:    Semaglutide, 1 MG/DOSE, (OZEMPIC, 1 MG/DOSE,) 4 MG/3ML SOPN, INJECT 1 MG UNDER THE SKIN ONE DAY A WEEK, Disp: 3 mL, Rfl: 2   SYRINGE/NEEDLE, DISP, 1 ML (BD ECLIPSE SYRINGE) 25G X 5/8" 1 ML MISC, Used to give B-12 injections., Disp: 50 each, Rfl: 0  Imaging Review   MR Lumbar Spine Wo Contrast  Narrative CLINICAL DATA:  Lumbar radiculopathy, symptoms persist with > 6 wks treatment xrays done prior with chiropractor, worsening  left numbness  EXAM: MRI LUMBAR SPINE WITHOUT CONTRAST  TECHNIQUE: Multiplanar, multisequence MR imaging of the lumbar spine was performed. No intravenous contrast was administered.  COMPARISON:  None Available.  FINDINGS: Segmentation:  Standard.  Alignment: Grade 1 anterolisthesis of L4 on L5. Trace retrolisthesis at L3-4.  Vertebrae:  No fracture, evidence of discitis, or bone lesion.  Conus medullaris and cauda equina: Conus extends to the L1 level. Conus and cauda equina appear normal.  Paraspinal and other soft tissues: No acute abnormality.  Disc levels:  T12-L1 through L2-L3: No significant disc protrusion, foraminal stenosis, or canal stenosis.  L3-L4: Trace retrolisthesis and mild annular disc bulge. Mild bilateral facet hypertrophy. No foraminal or canal stenosis.  L4-L5: Anterolisthesis with disc uncovering. Shallow left foraminal disc protrusion. Moderate to severe bilateral facet arthropathy with ligamentum flavum buckling. Left greater than right subarticular recess stenosis with mild canal stenosis. No significant foraminal stenosis.  L5-S1: Mild annular disc bulge. Moderate bilateral facet arthropathy. No foraminal or canal stenosis.  IMPRESSION: Lumbar spondylosis most pronounced at L4-5 where moderate-severe bilateral facet arthropathy contributes to grade 1 anterolisthesis. Left greater than right subarticular recess stenosis and  mild canal stenosis at this level.   Electronically Signed By: Duanne Guess D.O. On: 04/09/2023 09:45    Complexity Note: Imaging results reviewed.                         ROS  Cardiovascular: Weak heart (CHF) Pulmonary or Respiratory: Snoring  and Temporary stoppage of breathing during sleep Neurological: No reported neurological signs or symptoms such as seizures, abnormal skin sensations, urinary and/or fecal incontinence, being born with an abnormal open spine and/or a tethered spinal  cord Psychological-Psychiatric: No reported psychological or psychiatric signs or symptoms such as difficulty sleeping, anxiety, depression, delusions or hallucinations (schizophrenial), mood swings (bipolar disorders) or suicidal ideations or attempts Gastrointestinal: Reflux or heatburn Genitourinary: No reported renal or genitourinary signs or symptoms such as difficulty voiding or producing urine, peeing blood, non-functioning kidney, kidney stones, difficulty emptying the bladder, difficulty controlling the flow of urine, or chronic kidney disease Hematological: No reported hematological signs or symptoms such as prolonged bleeding, low or poor functioning platelets, bruising or bleeding easily, hereditary bleeding problems, low energy levels due to low hemoglobin or being anemic Endocrine: High blood sugar controlled without the use of insulin (NIDDM) Rheumatologic: No reported rheumatological signs and symptoms such as fatigue, joint pain, tenderness, swelling, redness, heat, stiffness, decreased range of motion, with or without associated rash Musculoskeletal: Negative for myasthenia gravis, muscular dystrophy, multiple sclerosis or malignant hyperthermia Work History: Working full time  Allergies  Katrina Kelly is allergic to keflex [cephalexin], metformin and related, and lactose intolerance (gi).  Laboratory Chemistry Profile   Renal Lab Results  Component Value Date   BUN 14 02/18/2023   CREATININE 1.04 02/18/2023   BCR NOT APPLICABLE 09/05/2020   GFR 58.03 (L) 02/18/2023   SPECGRAV >=1.030 (A) 10/16/2017   PHUR 6.0 10/16/2017   PROTEINUR negatvie 10/16/2017     Electrolytes Lab Results  Component Value Date   NA 140 02/18/2023   K 3.9 02/18/2023   CL 107 02/18/2023   CALCIUM 9.2 02/18/2023     Hepatic Lab Results  Component Value Date   AST 17 02/18/2023   ALT 19 02/18/2023   ALBUMIN 3.8 02/18/2023   ALKPHOS 70 02/18/2023     ID Lab Results  Component Value Date    HIV NON-REACTIVE 04/09/2022   SARSCOV2NAA Detected (A) 07/21/2019     Bone Lab Results  Component Value Date   VD25OH 8.62 (L) 04/09/2022     Endocrine Lab Results  Component Value Date   GLUCOSE 112 (H) 02/18/2023   GLUCOSEU NEGATIVE 08/13/2016   HGBA1C 6.4 02/18/2023   TSH 3.14 04/09/2022     Neuropathy Lab Results  Component Value Date   VITAMINB12 372 04/09/2022   FOLATE 10.2 04/09/2022   HGBA1C 6.4 02/18/2023   HIV NON-REACTIVE 04/09/2022     CNS No results found for: "COLORCSF", "APPEARCSF", "RBCCOUNTCSF", "WBCCSF", "POLYSCSF", "LYMPHSCSF", "EOSCSF", "PROTEINCSF", "GLUCCSF", "JCVIRUS", "CSFOLI", "IGGCSF", "LABACHR", "ACETBL"   Inflammation (CRP: Acute  ESR: Chronic) No results found for: "CRP", "ESRSEDRATE", "LATICACIDVEN"   Rheumatology No results found for: "RF", "ANA", "LABURIC", "URICUR", "LYMEIGGIGMAB", "LYMEABIGMQN", "HLAB27"   Coagulation Lab Results  Component Value Date   PLT 299.0 04/09/2022     Cardiovascular Lab Results  Component Value Date   HGB 13.5 04/09/2022   HCT 40.2 04/09/2022     Screening Lab Results  Component Value Date   SARSCOV2NAA Detected (A) 07/21/2019   HIV NON-REACTIVE 04/09/2022     Cancer  No results found for: "CEA", "CA125", "LABCA2"   Allergens No results found for: "ALMOND", "APPLE", "ASPARAGUS", "AVOCADO", "BANANA", "BARLEY", "BASIL", "BAYLEAF", "GREENBEAN", "LIMABEAN", "WHITEBEAN", "BEEFIGE", "REDBEET", "BLUEBERRY", "BROCCOLI", "CABBAGE", "MELON", "CARROT", "CASEIN", "CASHEWNUT", "CAULIFLOWER", "CELERY"     Note: Lab results reviewed.  PFSH  Drug: Katrina Kelly  reports no history of drug use. Alcohol:  reports current alcohol use of about 1.0 standard drink of alcohol per week. Tobacco:  reports that she quit smoking about 38 years ago. Her smoking use included cigarettes. She has a 1.25 pack-year smoking history. She has never used smokeless tobacco. Medical:  has a past medical history of Allergy, Anxiety,  Basal cell carcinoma, Cardiomyopathy (HCC), CHF (congestive heart failure) (HCC), COVID-19, Diabetes type 2, uncontrolled, Heartburn, and Hypertension. Family: family history includes Breast cancer (age of onset: 47) in her paternal aunt; Cancer in her father; Diabetes in her brother, mother, and sister; Diabetes Mellitus II in her maternal grandmother; Emphysema in her father; Heart attack in her maternal uncle; Hypertension in her daughter and mother; Leukemia in her brother; Lymphoma in her paternal uncle; Polycystic ovary syndrome in her daughter; Stroke in her brother.  Past Surgical History:  Procedure Laterality Date   CHOLECYSTECTOMY  2008   COLONOSCOPY WITH PROPOFOL N/A 01/03/2017   Procedure: COLONOSCOPY WITH PROPOFOL;  Surgeon: Napoleon Form, MD;  Location: [facility];  Service: Endoscopy;  Laterality: N/A;   FOOT SURGERY  1982   "benign tumor bottom of right foot"   NASAL SEPTUM SURGERY  2000   [facility]. [facility] [place]   TONSILLECTOMY  2000   uterine ablation     UVULECTOMY  2000   Active Ambulatory Problems    Diagnosis Date Noted   GERD (gastroesophageal reflux disease) 05/08/2016   NICM (nonischemic cardiomyopathy) (HCC) 05/08/2016   Environmental allergies 05/08/2016   History of skin cancer 05/08/2016   Controlled diabetes mellitus type 2 with complications (HCC)    Carpal tunnel syndrome 09/23/2016   Polyp of descending colon    Polyp of rectum    Hyperlipidemia 05/26/2017   Peripheral neuropathy 08/25/2017   Situational stress 08/25/2017   Depression, major, single episode, mild (HCC) 11/27/2020   Fatigue 11/27/2020   Hypertension    B12 deficiency    Loud snoring 01/17/2021   OSA (obstructive sleep apnea) 05/18/2021   Sinusitis 06/01/2021   Low back pain 02/18/2023   Lumbar facet arthropathy 05/06/2023   Lumbar degenerative disc disease 05/06/2023   Chronic pain syndrome 05/06/2023   Resolved Ambulatory Problems    Diagnosis Date Noted   Special  screening for malignant neoplasms, colon    Polyp of sigmoid colon    Past Medical History:  Diagnosis Date   Allergy    Anxiety    Basal cell carcinoma    Cardiomyopathy (HCC)    CHF (congestive heart failure) (HCC)    COVID-19    Diabetes type 2, uncontrolled    Heartburn    Constitutional Exam  General appearance: Well nourished, well developed, and well hydrated. In no apparent acute distress Vitals:   05/06/23 0843  BP: 116/83  Pulse: 94  Resp: 18  Temp: (!) 97.3 F (36.3 C)  TempSrc: Temporal  SpO2: 98%  Weight: 190 lb (86.2 kg)  Height: 5\' 7"  (1.702 m)   BMI Assessment: Estimated body mass index is 29.76 kg/m as calculated from the following:   Height as of this encounter: 5\' 7"  (1.702 m).   Weight as of this encounter: 190 lb (86.2  kg).  BMI interpretation table: BMI level Category Range association with higher incidence of chronic pain  <18 kg/m2 Underweight   18.5-24.9 kg/m2 Ideal body weight   25-29.9 kg/m2 Overweight Increased incidence by 20%  30-34.9 kg/m2 Obese (Class I) Increased incidence by 68%  35-39.9 kg/m2 Severe obesity (Class II) Increased incidence by 136%  >40 kg/m2 Extreme obesity (Class III) Increased incidence by 254%   Patient's current BMI Ideal Body weight  Body mass index is 29.76 kg/m. Ideal body weight: 61.6 kg (135 lb 12.9 oz) Adjusted ideal body weight: 71.4 kg (157 lb 7.7 oz)   BMI Readings from Last 4 Encounters:  05/06/23 29.76 kg/m  02/18/23 30.45 kg/m  07/17/22 28.94 kg/m  04/08/22 28.57 kg/m   Wt Readings from Last 4 Encounters:  05/06/23 190 lb (86.2 kg)  02/18/23 194 lb 6.4 oz (88.2 kg)  07/17/22 184 lb 12.8 oz (83.8 kg)  04/08/22 182 lb 6.4 oz (82.7 kg)    Psych/Mental status: Alert, oriented x 3 (person, place, & time)       Eyes: PERLA Respiratory: No evidence of acute respiratory distress  Thoracic Spine Area Exam  Skin & Axial Inspection: No masses, redness, or swelling Alignment:  Symmetrical Functional ROM: Unrestricted ROM Stability: No instability detected Muscle Tone/Strength: Functionally intact. No obvious neuro-muscular anomalies detected. Sensory (Neurological): Unimpaired Muscle strength & Tone: No palpable anomalies Lumbar Spine Area Exam  Skin & Axial Inspection: No masses, redness, or swelling Alignment: Symmetrical Functional ROM: Pain restricted ROM affecting both sides Stability: No instability detected Muscle Tone/Strength: Functionally intact. No obvious neuro-muscular anomalies detected. Sensory (Neurological): Musculoskeletal pain pattern,  Palpation: Complains of area being tender to palpation       Provocative Tests: Hyperextension/rotation test: (+) bilaterally for facet joint pain. Lumbar quadrant test (Kemp's test): (+) bilaterally for facet joint pain.  Lower Extremity Exam    Side: Right lower extremity  Side: Left lower extremity  Stability: No instability observed          Stability: No instability observed          Skin & Extremity Inspection: Skin color, temperature, and hair growth are WNL. No peripheral edema or cyanosis. No masses, redness, swelling, asymmetry, or associated skin lesions. No contractures.  Skin & Extremity Inspection: Skin color, temperature, and hair growth are WNL. No peripheral edema or cyanosis. No masses, redness, swelling, asymmetry, or associated skin lesions. No contractures.  Functional ROM: Unrestricted ROM                  Functional ROM: Unrestricted ROM                  Muscle Tone/Strength: Functionally intact. No obvious neuro-muscular anomalies detected.  Muscle Tone/Strength: Functionally intact. No obvious neuro-muscular anomalies detected.  Sensory (Neurological): Unimpaired        Sensory (Neurological): Unimpaired        DTR: Patellar: deferred today Achilles: deferred today Plantar: deferred today  DTR: Patellar: deferred today Achilles: deferred today Plantar: deferred today  Palpation:  No palpable anomalies  Palpation: No palpable anomalies    Assessment  Primary Diagnosis & Pertinent Problem List: The primary encounter diagnosis was Lumbar spondylosis. Diagnoses of Lumbar facet arthropathy, Lumbar degenerative disc disease, and Chronic pain syndrome were also pertinent to this visit.  Visit Diagnosis (New problems to examiner): 1. Lumbar spondylosis   2. Lumbar facet arthropathy   3. Lumbar degenerative disc disease   4. Chronic pain syndrome    Plan  of Care (Initial workup plan)  Katrina Kelly has a history of greater than 3 months of moderate to severe pain which is resulted in functional impairment.  The patient has tried various conservative therapeutic options such as NSAIDs, Tylenol,  physical therapy exercises which was inadequately effective.  Patient's pain is predominantly axial with physical exam and L-MRI findings suggestive of facet arthropathy. Lumbar facet medial branch nerve blocks were discussed with the patient.  Risks and benefits were reviewed.  Patient would like to proceed with bilateral L3, L4, L5 medial branch nerve block.   Procedure Orders         LUMBAR FACET(MEDIAL BRANCH NERVE BLOCK) MBNB       Provider-requested follow-up: Return in about 22 days (around 05/28/2023) for B/L L3, 4, 5 MBNB #1, in clinic IV Versed.  No future appointments.  Duration of encounter: .  Total time on encounter, as per AMA guidelines included both the face-to-face and non-face-to-face time personally spent by the physician and/or other qualified health care professional(s) on the day of the encounter (includes time in activities that require the physician or other qualified health care professional and does not include time in activities normally performed by clinical staff). Physician's time may include the following activities when performed: Preparing to see the patient (e.g., pre-charting review of records, searching for previously ordered imaging, lab  work, and nerve conduction tests) Review of prior analgesic pharmacotherapies. Reviewing PMP Interpreting ordered tests (e.g., lab work, imaging, nerve conduction tests) Performing post-procedure evaluations, including interpretation of diagnostic procedures Obtaining and/or reviewing separately obtained history Performing a medically appropriate examination and/or evaluation Counseling and educating the patient/family/caregiver Ordering medications, tests, or procedures Referring and communicating with other health care professionals (when not separately reported) Documenting clinical information in the electronic or other health record Independently interpreting results (not separately reported) and communicating results to the patient/ family/caregiver Care coordination (not separately reported)  Note by: Edward Jolly, MD (TTS technology used. I apologize for any typographical errors that were not detected and corrected.) Date: 05/06/2023; Time: 10:01 AM

## 2023-05-06 NOTE — Patient Instructions (Addendum)
Facet Joint Block The facet joints connect the bones of the spine (vertebrae). They let you bend, twist, and make other movements with your spine. They also keep you from bending too far, twisting too far, and making other extreme movements. A facet joint block is a procedure where a numbing medicine (local anesthesia) is injected into a facet joint. Many times, a medicine for inflammation (steroid) is also injected. A facet joint block may be done: To diagnose neck or back pain. If the pain gets better after a facet joint block, the pain is likely coming from the facet joint. If the pain does not get better, the pain is likely not coming from the facet joint. To treat neck or back pain caused by an inflamed facet joint. To help you with physical therapy or other rehab (rehabilitation) exercises. Tell a health care provider about: Any allergies you have. All medicines you are taking, including vitamins, herbs, eye drops, creams, and over-the-counter medicines. Any problems you or family members have had with anesthesia. Any bleeding problems you have. Any surgeries you have had. Any medical conditions you have or have had. Whether you are pregnant or may be pregnant. What are the risks? Your health care provider will talk with you about risks. These may include: Infection. Allergic reactions to medicines or dyes. Bleeding. Injury to a nerve near where the needle was put in (injection site). Pain at the injection site. Short-term weakness or numbness in areas near the nerves at the injection site. What happens before the procedure? When to stop eating and drinking Follow instructions from your health care provider about what you may eat and drink. Medicines Ask your health care provider about: Changing or stopping your regular medicines. These include any diabetes medicines or blood thinners you take. Taking medicines such as aspirin and ibuprofen. These medicines can thin your blood. Do  not take these medicines unless your health care provider tells you to. Taking over-the-counter medicines, vitamins, herbs, and supplements. General instructions If you will be going home right after the procedure, plan to have a responsible adult: Take you home from the hospital or clinic. You will not be allowed to drive. Care for you for the time you are told. Ask your health care provider: How your injection site will be marked. What steps will be taken to help prevent infection. These may include washing skin with a soap that kills germs. What happens during the procedure?  An IV will be inserted into one of your veins. You will lie on your stomach on an X-ray table. You may be asked to lie in a different position if you will be getting an injection in your neck. Your injection site will be cleaned with a soap that kills germs and then covered with a germ-free (sterile) drape. A local anesthesia will be put in at the injection site. A type of X-ray machine (fluoroscopy) or CT scan will be used to help find your facet joint. A contrast dye may also be injected into your joint to help show if the needle is at the joint. When your provider knows the needle is at your joint, they will inject anesthesia and anti-inflammatory medicine as needed. The needle will be removed. Pressure will be applied to keep your injection site from bleeding. A bandage (dressing) will be placed over each injection site. The procedure may vary among health care providers and hospitals. What happens after the procedure? Your blood pressure, heart rate, breathing rate, and blood oxygen level  will be monitored until you leave the hospital or clinic. This information is not intended to replace advice given to you by your health care provider. Make sure you discuss any questions you have with your health care provider. Document Revised: 06/14/2022 Document Reviewed: 06/14/2022 [employer] Patient Education  2023  [employer] Discussed exercises for lumbar facet joint pain Follow up for lumbar facet nerve block ______________________________________________________________________  Preparing for your procedure  Appointments: If you think you may not be able to keep your appointment, call 24-48 hours in advance to cancel. We need time to make it available to others.  During your procedure appointment there will be: No Prescription Refills. No disability issues to discussed. No medication changes or discussions.  Instructions: Food intake: Avoid eating anything solid for at least 8 hours prior to your procedure. Clear liquid intake: You may take clear liquids such as water up to 2 hours prior to your procedure. (No carbonated drinks. No soda.) Transportation: Unless otherwise stated by your physician, bring a driver. Morning Medicines: Except for blood thinners, take all of your other morning medications with a sip of water. Make sure to take your heart and blood pressure medicines. If your blood pressure's lower number is above 100, the case will be rescheduled. Blood thinners: Make sure to stop your blood thinners as instructed.  If you take a blood thinner, but were not instructed to stop it, call our office [phone] and ask to talk to a nurse. Not stopping a blood thinner prior to certain procedures could lead to serious complications. Diabetics on insulin: Notify the staff so that you can be scheduled 1st case in the morning. If your diabetes requires high dose insulin, take only  of your normal insulin dose the morning of the procedure and notify the staff that you have done so. Preventing infections: Shower with an antibacterial soap the morning of your procedure.  Build-up your immune system: Take 1000 mg of Vitamin C with every meal (3 times a day) the day prior to your procedure. Antibiotics: Inform the nursing staff if you are taking any antibiotics or if you have any conditions that  may require antibiotics prior to procedures. (Example: recent joint implants)   Pregnancy: If you are pregnant make sure to notify the nursing staff. Not doing so may result in injury to the fetus, including death.  Sickness: If you have a cold, fever, or any active infections, call and cancel or reschedule your procedure. Receiving steroids while having an infection may result in complications. Arrival: You must be in the facility at least 30 minutes prior to your scheduled procedure. Tardiness: Your scheduled time is also the cutoff time. If you do not arrive at least 15 minutes prior to your procedure, you will be rescheduled.  Children: Do not bring any children with you. Make arrangements to keep them home. Dress appropriately: There is always a possibility that your clothing may get soiled. Avoid long dresses. Valuables: Do not bring any jewelry or valuables.  Reasons to call and reschedule or cancel your procedure: (Following these recommendations will minimize the risk of a serious complication.) Surgeries: Avoid having procedures within 2 weeks of any surgery. (Avoid for 2 weeks before or after any surgery). Flu Shots: Avoid having procedures within 2 weeks of a flu shots or . (Avoid for 2 weeks before or after immunizations). Barium: Avoid having a procedure within 7-10 days after having had a radiological study involving the use of radiological contrast. (Myelograms, Barium swallow  or enema study). Heart attacks: Avoid any elective procedures or surgeries for the initial 6 months after a "Myocardial Infarction" (Heart Attack). Blood thinners: It is imperative that you stop these medications before procedures. Let us know if you if you take any blood thinner.  Infection: Avoid procedures during or within two weeks of an infection (including chest colds or gastrointestinal problems). Symptoms associated with infections include: Localized redness, fever, chills, night sweats or profuse  sweating, burning sensation when voiding, cough, congestion, stuffiness, runny nose, sore throat, diarrhea, nausea, vomiting, cold or Flu symptoms, recent or current infections. It is specially important if the infection is over the area that we intend to treat. Heart and lung problems: Symptoms that may suggest an active cardiopulmonary problem include: cough, chest pain, breathing difficulties or shortness of breath, dizziness, ankle swelling, uncontrolled high or unusually low blood pressure, and/or palpitations. If you are experiencing any of these symptoms, cancel your procedure and contact your primary care physician for an evaluation.  Remember:  Regular Business hours are:  Monday to Thursday 8:00 AM to 4:00 PM  Provider's Schedule: Delano Metz, MD:  Procedure days: Tuesday and Thursday 7:30 AM to 4:00 PM  Edward Jolly, MD:  Procedure days: Monday and Wednesday 7:30 AM to 4:00 PM  ______________________________________________________________________

## 2023-05-06 NOTE — Progress Notes (Signed)
Safety precautions to be maintained throughout the outpatient stay will include: orient to surroundings, keep bed in low position, maintain call bell within reach at all times, provide assistance with transfer out of bed and ambulation.  

## 2023-05-28 ENCOUNTER — Ambulatory Visit
Payer: PRIVATE HEALTH INSURANCE | Attending: Student in an Organized Health Care Education/Training Program | Admitting: Student in an Organized Health Care Education/Training Program

## 2023-05-28 ENCOUNTER — Ambulatory Visit
Admission: RE | Admit: 2023-05-28 | Discharge: 2023-05-28 | Disposition: A | Discharge status: Home / Self Care | Payer: PRIVATE HEALTH INSURANCE | Source: Ambulatory Visit | Attending: Student in an Organized Health Care Education/Training Program | Admitting: Student in an Organized Health Care Education/Training Program

## 2023-05-28 ENCOUNTER — Encounter: Payer: Self-pay | Admitting: Student in an Organized Health Care Education/Training Program

## 2023-05-28 DIAGNOSIS — M47816 Spondylosis without myelopathy or radiculopathy, lumbar region: Secondary | ICD-10-CM | POA: Insufficient documentation

## 2023-05-28 DIAGNOSIS — G894 Chronic pain syndrome: Secondary | ICD-10-CM | POA: Insufficient documentation

## 2023-05-28 MED ORDER — MIDAZOLAM HCL 2 MG/2ML IJ SOLN
INTRAMUSCULAR | Status: AC
  Filled 2023-05-28: qty 2

## 2023-05-28 MED ORDER — LACTATED RINGERS IV SOLN: Freq: Once | INTRAVENOUS | Status: AC

## 2023-05-28 MED ORDER — LIDOCAINE HCL 2 % IJ SOLN
20.0000 mL | Freq: Once | INTRAMUSCULAR | Status: AC
  Administered 2023-05-28: 400 mg

## 2023-05-28 MED ORDER — ROPIVACAINE HCL 2 MG/ML IJ SOLN
9.0000 mL | Freq: Once | INTRAMUSCULAR | Status: AC
  Administered 2023-05-28: 9 mL via PERINEURAL

## 2023-05-28 MED ORDER — ROPIVACAINE HCL 2 MG/ML IJ SOLN
INTRAMUSCULAR | Status: AC
  Filled 2023-05-28: qty 20

## 2023-05-28 MED ORDER — MIDAZOLAM HCL 2 MG/2ML IJ SOLN
0.5000 mg | Freq: Once | INTRAMUSCULAR | Status: AC
  Administered 2023-05-28: 2 mg via INTRAVENOUS

## 2023-05-28 MED ORDER — DEXAMETHASONE SODIUM PHOSPHATE 10 MG/ML IJ SOLN
10.0000 mg | Freq: Once | INTRAMUSCULAR | Status: AC
  Administered 2023-05-28: 10 mg

## 2023-05-28 MED ORDER — DEXAMETHASONE SODIUM PHOSPHATE 10 MG/ML IJ SOLN
INTRAMUSCULAR | Status: AC
  Filled 2023-05-28: qty 2

## 2023-05-28 MED ORDER — LIDOCAINE HCL 2 % IJ SOLN
INTRAMUSCULAR | Status: AC
  Filled 2023-05-28: qty 20

## 2023-05-28 NOTE — Progress Notes (Signed)
PROVIDER NOTE: Interpretation of information contained herein should be left to medically-trained personnel. Specific patient instructions are provided elsewhere under "Patient Instructions" section of medical record. This document was created in part using STT-dictation technology, any transcriptional errors that may result from this process are unintentional.  Patient: Katrina Kelly Type: Established DOB: [birth year] MRN: [id number] PCP: Allegra Grana, FNP  Service: Procedure DOS: 05/28/2023 Setting: Ambulatory Location: Ambulatory outpatient facility Delivery: Face-to-face Provider: Edward Jolly, MD Specialty: Interventional Pain Management Specialty designation: 09 Location: Outpatient facility Ref. Prov.: Edward Jolly, MD       Interventional Therapy   Procedure: Lumbar Facet, Medial Branch Block(s) #1  Laterality: Left  Level: L3, L4, and L5 Medial Branch Level(s). Injecting these levels blocks the L3-4 and L4-5 lumbar facet joints. Imaging: Fluoroscopic guidance         Anesthesia: Local anesthesia (1-2% Lidocaine) Anxiolysis: IV Versed         DOS: 05/28/2023 Performed by: Edward Jolly, MD  Primary Purpose: Diagnostic/Therapeutic Indications: Low back pain severe enough to impact quality of life or function. 1. Lumbar spondylosis   2. Lumbar facet arthropathy   3. Chronic pain syndrome    NAS-11 Pain score:   Pre-procedure: 4 /10   Post-procedure: 4 /10     Position / Prep / Materials:  Position: Prone  Prep solution: DuraPrep (Iodine Povacrylex [0.7% available iodine] and Isopropyl Alcohol, 74% w/w) Area Prepped: Posterolateral Lumbosacral Spine (Wide prep: From the lower border of the scapula down to the end of the tailbone and from flank to flank.)  Materials:  Tray: Block Needle(s):  Type: Spinal  Gauge (G): 22  Length: 5-in Qty: 2      Pre-op H&P Assessment:  Katrina Kelly is a 62 y.o. (year old), female patient, seen today for interventional treatment.  She  has a past surgical history that includes Cholecystectomy (2008); Tonsillectomy (2000); Uvulectomy (2000); Nasal septum surgery (2000); Foot surgery (1982); uterine ablation; and Colonoscopy with propofol (N/A, 01/03/2017). Katrina Kelly has a current medication list which includes the following prescription(s): acetaminophen, atorvastatin, bupropion, carvedilol, celecoxib, cetirizine, cholecalciferol, cyanocobalamin, furosemide, isosorbide mononitrate, onetouch delica plus lancet30g, lisinopril, b-d disp needle 25gx1", nitroglycerin, pantoprazole, potassium chloride, potassium, ozempic (1 mg/dose), and bd eclipse syringe. Her primarily concern today is the Hip Pain (Left )  Initial Vital Signs:  Pulse/HCG Rate: 86  Temp: (!) 97.2 F (36.2 C) Resp: 17 BP: 111/75 SpO2: 97 %  BMI: Estimated body mass index is 29.76 kg/m as calculated from the following:   Height as of this encounter: 5\' 7"  (1.702 m).   Weight as of this encounter: 190 lb (86.2 kg).  Risk Assessment: Allergies: Reviewed. She is allergic to keflex [cephalexin], metformin and related, and lactose intolerance (gi).  Allergy Precautions: None required Coagulopathies: Reviewed. None identified.  Blood-thinner therapy: None at this time Active Infection(s): Reviewed. None identified. Katrina Kelly is afebrile  Site Confirmation: Katrina Kelly was asked to confirm the procedure and laterality before marking the site Procedure checklist: Completed Consent: Before the procedure and under the influence of no sedative(s), amnesic(s), or anxiolytics, the patient was informed of the treatment options, risks and possible complications. To fulfill our ethical and legal obligations, as recommended by the American Medical Association's Code of Ethics, I have informed the patient of my clinical impression; the nature and purpose of the treatment or procedure; the risks, benefits, and possible complications of the intervention; the alternatives,  including doing nothing; the risk(s) and benefit(s) of the alternative treatment(s) or  procedure(s); and the risk(s) and benefit(s) of doing nothing. The patient was provided information about the general risks and possible complications associated with the procedure. These may include, but are not limited to: failure to achieve desired goals, infection, bleeding, organ or nerve damage, allergic reactions, paralysis, and death. In addition, the patient was informed of those risks and complications associated to Spine-related procedures, such as failure to decrease pain; infection (i.e.: Meningitis, epidural or intraspinal abscess); bleeding (i.e.: epidural hematoma, subarachnoid hemorrhage, or any other type of intraspinal or peri-dural bleeding); organ or nerve damage (i.e.: Any type of peripheral nerve, nerve root, or spinal cord injury) with subsequent damage to sensory, motor, and/or autonomic systems, resulting in permanent pain, numbness, and/or weakness of one or several areas of the body; allergic reactions; (i.e.: anaphylactic reaction); and/or death. Furthermore, the patient was informed of those risks and complications associated with the medications. These include, but are not limited to: allergic reactions (i.e.: anaphylactic or anaphylactoid reaction(s)); adrenal axis suppression; blood sugar elevation that in diabetics may result in ketoacidosis or comma; water retention that in patients with history of congestive heart failure may result in shortness of breath, pulmonary edema, and decompensation with resultant heart failure; weight gain; swelling or edema; medication-induced neural toxicity; particulate matter embolism and blood vessel occlusion with resultant organ, and/or nervous system infarction; and/or aseptic necrosis of one or more joints. Finally, the patient was informed that Medicine is not an exact science; therefore, there is also the possibility of unforeseen or unpredictable risks  and/or possible complications that may result in a catastrophic outcome. The patient indicated having understood very clearly. We have given the patient no guarantees and we have made no promises. Enough time was given to the patient to ask questions, all of which were answered to the patient's satisfaction. Katrina Kelly has indicated that she wanted to continue with the procedure. Attestation: I, the ordering provider, attest that I have discussed with the patient the benefits, risks, side-effects, alternatives, likelihood of achieving goals, and potential problems during recovery for the procedure that I have provided informed consent. Date  Time: 05/28/2023 11:43 AM   Pre-Procedure Preparation:  Monitoring: As per clinic protocol. Respiration, ETCO2, SpO2, BP, heart rate and rhythm monitor placed and checked for adequate function Safety Precautions: Patient was assessed for positional comfort and pressure points before starting the procedure. Time-out: I initiated and conducted the "Time-out" before starting the procedure, as per protocol. The patient was asked to participate by confirming the accuracy of the "Time Out" information. Verification of the correct person, site, and procedure were performed and confirmed by me, the nursing staff, and the patient. "Time-out" conducted as per Joint Commission's Universal Protocol (UP.01.01.01). Time: 1210 Start Time: 1210 hrs.  Description of Procedure:          Laterality: (see above) Targeted Levels: (see above)  Safety Precautions: Aspiration looking for blood return was conducted prior to all injections. At no point did we inject any substances, as a needle was being advanced. Before injecting, the patient was told to immediately notify me if she was experiencing any new onset of "ringing in the ears, or metallic taste in the mouth". No attempts were made at seeking any paresthesias. Safe injection practices and needle disposal techniques used.  Medications properly checked for expiration dates. SDV (single dose vial) medications used. After the completion of the procedure, all disposable equipment used was discarded in the proper designated medical waste containers. Local Anesthesia: Protocol guidelines were followed. The patient  was positioned over the fluoroscopy table. The area was prepped in the usual manner. The time-out was completed. The target area was identified using fluoroscopy. A 12-in long, straight, sterile hemostat was used with fluoroscopic guidance to locate the targets for each level blocked. Once located, the skin was marked with an approved surgical skin marker. Once all sites were marked, the skin (epidermis, dermis, and hypodermis), as well as deeper tissues (fat, connective tissue and muscle) were infiltrated with a small amount of a short-acting local anesthetic, loaded on a 10cc syringe with a 25G, 1.5-in  Needle. An appropriate amount of time was allowed for local anesthetics to take effect before proceeding to the next step. Local Anesthetic: Lidocaine 2.0% The unused portion of the local anesthetic was discarded in the proper designated containers. Technical description of process:  L3 Medial Branch Nerve Block (MBB): The target area for the L3 medial branch is at the junction of the postero-lateral aspect of the superior articular process and the superior, posterior, and medial edge of the transverse process of L4. Under fluoroscopic guidance, a Quincke needle was inserted until contact was made with os over the superior postero-lateral aspect of the pedicular shadow (target area). After negative aspiration for blood, 2mL of the nerve block solution was injected without difficulty or complication. The needle was removed intact. L4 Medial Branch Nerve Block (MBB): The target area for the L4 medial branch is at the junction of the postero-lateral aspect of the superior articular process and the superior, posterior, and  medial edge of the transverse process of L5. Under fluoroscopic guidance, a Quincke needle was inserted until contact was made with os over the superior postero-lateral aspect of the pedicular shadow (target area). After negative aspiration for blood, 2mL of the nerve block solution was injected without difficulty or complication. The needle was removed intact. L5 Medial Branch Nerve Block (MBB): The target area for the L5 medial branch is at the junction of the postero-lateral aspect of the superior articular process and the superior, posterior, and medial edge of the sacral ala. Under fluoroscopic guidance, a Quincke needle was inserted until contact was made with os over the superior postero-lateral aspect of the pedicular shadow (target area). After negative aspiration for blood, 2mL of the nerve block solution was injected without difficulty or complication. The needle was removed intact.   Once the entire procedure was completed, the treated area was cleaned, making sure to leave some of the prepping solution back to take advantage of its long term bactericidal properties.         Illustration of the posterior view of the lumbar spine and the posterior neural structures. Laminae of L2 through S1 are labeled. DPRL5, dorsal primary ramus of L5; DPRS1, dorsal primary ramus of S1; DPR3, dorsal primary ramus of L3; FJ, facet (zygapophyseal) joint L3-L4; I, inferior articular process of L4; LB1, lateral branch of dorsal primary ramus of L1; IAB, inferior articular branches from L3 medial branch (supplies L4-L5 facet joint); IBP, intermediate branch plexus; MB3, medial branch of dorsal primary ramus of L3; NR3, third lumbar nerve root; S, superior articular process of L5; SAB, superior articular branches from L4 (supplies L4-5 facet joint also); TP3, transverse process of L3.   Facet Joint Innervation (* possible contribution)  L1-2 T12, L1 (L2*)  Medial Branch  L2-3 L1, L2 (L3*)         "          "   L3-4 L2, L3 (L4*)         "          "  L4-5 L3, L4 (L5*)         "          "  L5-S1 L4, L5, S1          "          "    Vitals:   05/28/23 1146 05/28/23 1207 05/28/23 1213 05/28/23 1220  BP: 111/75 116/75 126/88 105/78  Pulse: 86 83 89 86  Resp:  17 16 16   Temp: (!) 97.2 F (36.2 C)     TempSrc: Temporal     SpO2: 97% 98% 97% 96%  Weight: 190 lb (86.2 kg)     Height: 5\' 7"  (1.702 m)        End Time: 1213 hrs.  Imaging Guidance (Spinal):          Type of Imaging Technique: Fluoroscopy Guidance (Spinal) Indication(s): Assistance in needle guidance and placement for procedures requiring needle placement in or near specific anatomical locations not easily accessible without such assistance. Exposure Time: Please see nurses notes. Contrast: None used. Fluoroscopic Guidance: I was personally present during the use of fluoroscopy. "Tunnel Vision Technique" used to obtain the best possible view of the target area. Parallax error corrected before commencing the procedure. "Direction-depth-direction" technique used to introduce the needle under continuous pulsed fluoroscopy. Once target was reached, antero-posterior, oblique, and lateral fluoroscopic projection used confirm needle placement in all planes. Images permanently stored in EMR. Interpretation: No contrast injected. I personally interpreted the imaging intraoperatively. Adequate needle placement confirmed in multiple planes. Permanent images saved into the patient's record.  Post-operative Assessment:  Post-procedure Vital Signs:  Pulse/HCG Rate: 86  Temp: (!) 97.2 F (36.2 C) Resp: 16 BP: 105/78 SpO2: 96 %  EBL: None  Complications: No immediate post-treatment complications observed by team, or reported by patient.  Note: The patient tolerated the entire procedure well. A repeat set of vitals were taken after the procedure and the patient was kept under observation following institutional policy, for this type of  procedure. Post-procedural neurological assessment was performed, showing return to baseline, prior to discharge. The patient was provided with post-procedure discharge instructions, including a section on how to identify potential problems. Should any problems arise concerning this procedure, the patient was given instructions to immediately contact us, at any time, without hesitation. In any case, we plan to contact the patient by telephone for a follow-up status report regarding this interventional procedure.  Comments:  No additional relevant information.  Plan of Care (POC)  Orders:  Orders Placed This Encounter  Procedures   DG PAIN [facility] C-ARM 1-60 MIN NO REPORT    Intraoperative interpretation by procedural physician at [facility].    Standing Status:   Standing    Number of Occurrences:   1    Order Specific Question:   Reason for exam:    Answer:   Assistance in needle guidance and placement for procedures requiring needle placement in or near specific anatomical locations not easily accessible without such assistance.    Medications ordered for procedure: Meds ordered this encounter  Medications   lidocaine (XYLOCAINE) 2 % (with pres) injection 400 mg   lactated ringers infusion   midazolam (VERSED) injection 0.5-2 mg    Make sure Flumazenil is available in the pyxis when using this medication. If oversedation occurs, administer 0.2 mg IV over 15 sec. If after 45 sec no response, administer 0.2 mg again over 1 min; may repeat at 1 min intervals; not to exceed 4  doses (1 mg)   dexamethasone (DECADRON) injection 10 mg   ropivacaine (PF) 2 mg/mL (0.2%) (NAROPIN) injection 9 mL   Medications administered: We administered lidocaine, lactated ringers, midazolam, dexamethasone, and ropivacaine (PF) 2 mg/mL (0.2%).  See the medical record for exact dosing, route, and time of administration.  Follow-up plan:   Return in about 4 weeks (around 06/25/2023) for Post Procedure  Evaluation, in person.       Left L3,4,5 MBNB 05/27/22    Recent Visits Date Type Provider Dept  05/06/23 Office Visit Edward Jolly, MD [facility]  Showing recent visits within past 90 days and meeting all other requirements Today's Visits Date Type Provider Dept  05/28/23 Procedure visit Edward Jolly, MD [facility]  Showing today's visits and meeting all other requirements Future Appointments Date Type Provider Dept  06/25/23 Appointment Edward Jolly, MD [facility]  Showing future appointments within next 90 days and meeting all other requirements  Disposition: Discharge home  Discharge (Date  Time): 05/28/2023;   hrs.   Primary Care Physician: Allegra Grana, FNP Location: [facility] Note by: Edward Jolly, MD (TTS technology used. I apologize for any typographical errors that were not detected and corrected.) Date: 05/28/2023; Time: 1:39 PM  Disclaimer:  Medicine is not an Visual merchandiser. The only guarantee in medicine is that nothing is guaranteed. It is important to note that the decision to proceed with this intervention was based on the information collected from the patient. The Data and conclusions were drawn from the patient's questionnaire, the interview, and the physical examination. Because the information was provided in large part by the patient, it cannot be guaranteed that it has not been purposely or unconsciously manipulated. Every effort has been made to obtain as much relevant data as possible for this evaluation. It is important to note that the conclusions that lead to this procedure are derived in large part from the available data. Always take into account that the treatment will also be dependent on availability of resources and existing treatment guidelines, considered by other Pain Management Practitioners as being common knowledge and practice, at the time of the intervention. For Medico-Legal  purposes, it is also important to point out that variation in procedural techniques and pharmacological choices are the acceptable norm. The indications, contraindications, technique, and results of the above procedure should only be interpreted and judged by a Board-Certified Interventional Pain Specialist with extensive familiarity and expertise in the same exact procedure and technique.

## 2023-05-28 NOTE — Patient Instructions (Addendum)
____________________________________________________________________________________________  Post-Procedure Discharge Instructions  Instructions: Apply ice:  Purpose: This will minimize any swelling and discomfort after procedure.  When: Day of procedure, as soon as you get home. How: Fill a plastic sandwich bag with crushed ice. Cover it with a small towel and apply to injection site. How long: (15 min on, 15 min off) Apply for 15 minutes then remove x 15 minutes.  Repeat sequence on day of procedure, until you go to bed. Apply heat:  Purpose: To treat any soreness and discomfort from the procedure. When: Starting the next day after the procedure. How: Apply heat to procedure site starting the day following the procedure. How long: May continue to repeat daily, until discomfort goes away. Food intake: Start with clear liquids (like water) and advance to regular food, as tolerated.  Physical activities: Keep activities to a minimum for the first 8 hours after the procedure. After that, then as tolerated. Driving: If you have received any sedation, be responsible and do not drive. You are not allowed to drive for 24 hours after having sedation. Blood thinner: (Applies only to those taking blood thinners) You may restart your blood thinner 6 hours after your procedure. Insulin: (Applies only to Diabetic patients taking insulin) As soon as you can eat, you may resume your normal dosing schedule. Infection prevention: Keep procedure site clean and dry. Shower daily and clean area with soap and water. Post-procedure Pain Diary: Extremely important that this be done correctly and accurately. Recorded information will be used to determine the next step in treatment. For the purpose of accuracy, follow these rules: Evaluate only the area treated. Do not report or include pain from an untreated area. For the purpose of this evaluation, ignore all other areas of pain, except for the treated  area. After your procedure, avoid taking a long nap and attempting to complete the pain diary after you wake up. Instead, set your alarm clock to go off every hour, on the hour, for the initial 8 hours after the procedure. Document the duration of the numbing medicine, and the relief you are getting from it. Do not go to sleep and attempt to complete it later. It will not be accurate. If you received sedation, it is likely that you were given a medication that may cause amnesia. Because of this, completing the diary at a later time may cause the information to be inaccurate. This information is needed to plan your care. Follow-up appointment: Keep your post-procedure follow-up evaluation appointment after the procedure (usually 2 weeks for most procedures, 6 weeks for radiofrequencies). DO NOT FORGET to bring you pain diary with you.   Expect: (What should I expect to see with my procedure?) From numbing medicine (AKA: Local Anesthetics): Numbness or decrease in pain. You may also experience some weakness, which if present, could last for the duration of the local anesthetic. Onset: Full effect within 15 minutes of injected. Duration: It will depend on the type of local anesthetic used. On the average, 1 to 8 hours.  From steroids (Applies only if steroids were used): Decrease in swelling or inflammation. Once inflammation is improved, relief of the pain will follow. Onset of benefits: Depends on the amount of swelling present. The more swelling, the longer it will take for the benefits to be seen. In some cases, up to 10 days. Duration: Steroids will stay in the system x 2 weeks. Duration of benefits will depend on multiple posibilities including persistent irritating factors. Side-effects: If present, they   may typically last 2 weeks (the duration of the steroids). Frequent: Cramps (if they occur, drink Gatorade and take over-the-counter Magnesium 450-500 mg once to twice a day); water retention with  temporary weight gain; increases in blood sugar; decreased immune system response; increased appetite. Occasional: Facial flushing (red, warm cheeks); mood swings; menstrual changes. Uncommon: Long-term decrease or suppression of natural hormones; bone thinning. (These are more common with higher doses or more frequent use. This is why we prefer that our patients avoid having any injection therapies in other practices.)  Very Rare: Severe mood changes; psychosis; aseptic necrosis. From procedure: Some discomfort is to be expected once the numbing medicine wears off. This should be minimal if ice and heat are applied as instructed.  Call if: (When should I call?) You experience numbness and weakness that gets worse with time, as opposed to wearing off. New onset bowel or bladder incontinence. (Applies only to procedures done in the spine)  Emergency Numbers: Durning business hours (Monday - Thursday, 8:00 AM - 4:00 PM) (Friday, 9:00 AM - 12:00 Noon): [phone] After hours: [phone] NOTE: If you are having a problem and are unable connect with, or to talk to a provider, then go to your nearest urgent care or emergency department. If the problem is serious and urgent, please call 911. ____________________________________________________________________________________________    ____________________________________________________________________________________________  General Risks and Possible Complications  Patient Responsibilities: It is important that you read this as it is part of your informed consent. It is our duty to inform you of the risks and possible complications associated with treatments offered to you. It is your responsibility as a patient to read this and to ask questions about anything that is not clear or that you believe was not covered in this document.  Patient's Rights: You have the right to refuse treatment. You also have the right to change your mind, even  after initially having agreed to have the treatment done. However, under this last option, if you wait until the last second to change your mind, you may be charged for the materials used up to that point.  Introduction: Medicine is not an Visual merchandiser. Everything in Medicine, including the lack of treatment(s), carries the potential for danger, harm, or loss (which is by definition: Risk). In Medicine, a complication is a secondary problem, condition, or disease that can aggravate an already existing one. All treatments carry the risk of possible complications. The fact that a side effects or complications occurs, does not imply that the treatment was conducted incorrectly. It must be clearly understood that these can happen even when everything is done following the highest safety standards.  No treatment: You can choose not to proceed with the proposed treatment alternative. The "PRO(s)" would include: avoiding the risk of complications associated with the therapy. The "CON(s)" would include: not getting any of the treatment benefits. These benefits fall under one of three categories: diagnostic; therapeutic; and/or palliative. Diagnostic benefits include: getting information which can ultimately lead to improvement of the disease or symptom(s). Therapeutic benefits are those associated with the successful treatment of the disease. Finally, palliative benefits are those related to the decrease of the primary symptoms, without necessarily curing the condition (example: decreasing the pain from a flare-up of a chronic condition, such as incurable terminal cancer).  General Risks and Complications: These are associated to most interventional treatments. They can occur alone, or in combination. They fall under one of the following six (6) categories: no benefit or worsening  of symptoms; bleeding; infection; nerve damage; allergic reactions; and/or death. No benefits or worsening of symptoms: In Medicine there  are no guarantees, only probabilities. No healthcare provider can ever guarantee that a medical treatment will work, they can only state the probability that it may. Furthermore, there is always the possibility that the condition may worsen, either directly, or indirectly, as a consequence of the treatment. Bleeding: This is more common if the patient is taking a blood thinner, either prescription or over the counter (example: Goody Powders, Fish oil, Aspirin, Garlic, etc.), or if suffering a condition associated with impaired coagulation (example: Hemophilia, cirrhosis of the liver, low platelet counts, etc.). However, even if you do not have one on these, it can still happen. If you have any of these conditions, or take one of these drugs, make sure to notify your treating physician. Infection: This is more common in patients with a compromised immune system, either due to disease (example: diabetes, cancer, human immunodeficiency virus [HIV], etc.), or due to medications or treatments (example: therapies used to treat cancer and rheumatological diseases). However, even if you do not have one on these, it can still happen. If you have any of these conditions, or take one of these drugs, make sure to notify your treating physician. Nerve Damage: This is more common when the treatment is an invasive one, but it can also happen with the use of medications, such as those used in the treatment of cancer. The damage can occur to small secondary nerves, or to large primary ones, such as those in the spinal cord and brain. This damage may be temporary or permanent and it may lead to impairments that can range from temporary numbness to permanent paralysis and/or brain death. Allergic Reactions: Any time a substance or material comes in contact with our body, there is the possibility of an allergic reaction. These can range from a mild skin rash (contact dermatitis) to a severe systemic reaction (anaphylactic  reaction), which can result in death. Death: In general, any medical intervention can result in death, most of the time due to an unforeseen complication. ____________________________________________________________________________________________

## 2023-05-28 NOTE — Progress Notes (Signed)
Safety precautions to be maintained throughout the outpatient stay will include: orient to surroundings, keep bed in low position, maintain call bell within reach at all times, provide assistance with transfer out of bed and ambulation.  

## 2023-05-29 ENCOUNTER — Telehealth: Payer: Self-pay | Admitting: *Deleted

## 2023-05-29 NOTE — Telephone Encounter (Signed)
Post procedure call:   no  questions or concerns.  

## 2023-06-25 ENCOUNTER — Encounter: Payer: Self-pay | Admitting: Student in an Organized Health Care Education/Training Program

## 2023-06-25 ENCOUNTER — Ambulatory Visit
Payer: 59 | Attending: Student in an Organized Health Care Education/Training Program | Admitting: Student in an Organized Health Care Education/Training Program

## 2023-06-25 VITALS — BP 96/71 | HR 93 | Temp 98.0°F | Ht 67.0 in | Wt 190.0 lb

## 2023-06-25 DIAGNOSIS — G894 Chronic pain syndrome: Secondary | ICD-10-CM | POA: Diagnosis present

## 2023-06-25 DIAGNOSIS — M47816 Spondylosis without myelopathy or radiculopathy, lumbar region: Secondary | ICD-10-CM | POA: Diagnosis present

## 2023-06-25 MED ORDER — CELECOXIB 200 MG PO CAPS: 200.0000 mg | ORAL_CAPSULE | Freq: Every day | ORAL | 1 refills | Status: DC

## 2023-06-25 NOTE — Patient Instructions (Signed)
NPO 8 hours prior to procedure Bring driver Not on any blood thinners or BP meds; only on Ozempic and will change that shot schedue

## 2023-06-25 NOTE — Progress Notes (Signed)
PROVIDER NOTE: Information contained herein reflects review and annotations entered in association with encounter. Interpretation of such information and data should be left to medically-trained personnel. Information provided to patient can be located elsewhere in the medical record under "Patient Instructions". Document created using STT-dictation technology, any transcriptional errors that may result from process are unintentional.    Patient: Katrina Kelly  Service Category: E/M  Provider: Edward Jolly, MD  DOB: 12-Jul-1961  DOS: 06/25/2023  Referring Provider: Allegra Grana, FNP  MRN: [id number]  Specialty: Interventional Pain Management  PCP: Allegra Grana, FNP  Type: Established Patient  Setting: Ambulatory outpatient    Location: Office  Delivery: Face-to-face     HPI  Ms. Katrina Kelly, a 62 y.o. year old female, is here today because of her Lumbar spondylosis [M47.816]. Ms. Katrina Kelly primary complain today is Hip Pain (Left )  Pertinent problems: Ms. Katrina Kelly does not have any pertinent problems on file. Pain Assessment: Severity of Chronic pain is reported as a 5 /10. Location: Hip Left/radiates down to ankle. Onset: More than a month ago. Quality: Aching, Constant, Dull. Timing: Constant. Modifying factor(s): meds, heat. Vitals:  height is 5\' 7"  (1.702 m) and weight is 190 lb (86.2 kg). Her temperature is 98 F (36.7 C). Her blood pressure is 96/71 and her pulse is 93. Her oxygen saturation is 98%.  BMI: Estimated body mass index is 29.76 kg/m as calculated from the following:   Height as of this encounter: 5\' 7"  (1.702 m).   Weight as of this encounter: 190 lb (86.2 kg). Last encounter: 05/06/2023. Last procedure: 05/28/2023.  Reason for encounter: post-procedure evaluation and assessment.    Post-procedure evaluation   Procedure: Lumbar Facet, Medial Branch Block(s) #1  Laterality: Left  Level: L3, L4, and L5 Medial Branch Level(s). Injecting these levels blocks the L3-4  and L4-5 lumbar facet joints. Imaging: Fluoroscopic guidance         Anesthesia: Local anesthesia (1-2% Lidocaine) Anxiolysis: IV Versed         DOS: 05/28/2023 Performed by: Edward Jolly, MD  Primary Purpose: Diagnostic/Therapeutic Indications: Low back pain severe enough to impact quality of life or function. 1. Lumbar spondylosis   2. Lumbar facet arthropathy   3. Chronic pain syndrome    NAS-11 Pain score:   Pre-procedure: 4 /10   Post-procedure: 4 /10      Effectiveness:  Initial hour after procedure: 100 %  Subsequent 4-6 hours post-procedure: 100 %  Analgesia past initial 6 hours: 50% for 5 days (improved sleep, not waking up in pain) Ongoing improvement:  Analgesic:  30% Function: Somewhat improved ROM: Somewhat improved   ROS  Constitutional: Denies any fever or chills Gastrointestinal: No reported hemesis, hematochezia, vomiting, or acute GI distress Musculoskeletal:  left low back pain Neurological: No reported episodes of acute onset apraxia, aphasia, dysarthria, agnosia, amnesia, paralysis, loss of coordination, or loss of consciousness  Medication Review  Cholecalciferol, NEEDLE (DISP) 25 G, OneTouch Delica Plus Lancet30G, Potassium, SYRINGE/NEEDLE (DISP) 1 ML, Semaglutide (1 MG/DOSE), acetaminophen, atorvastatin, buPROPion, carvedilol, celecoxib, cetirizine, cyanocobalamin, furosemide, isosorbide mononitrate, lisinopril, nitroGLYCERIN, pantoprazole, and potassium chloride  History Review  Allergy: Ms. Katrina Kelly is allergic to keflex [cephalexin], metformin and related, and lactose intolerance (gi). Drug: Ms. Katrina Kelly  reports no history of drug use. Alcohol:  reports current alcohol use of about 1.0 standard drink of alcohol per week. Tobacco:  reports that she quit smoking about 38 years ago. Her smoking use included cigarettes. She has a 1.25 pack-year smoking  history. She has never used smokeless tobacco. Social: Ms. Katrina Kelly  reports that she quit smoking about  38 years ago. Her smoking use included cigarettes. She has a 1.25 pack-year smoking history. She has never used smokeless tobacco. She reports current alcohol use of about 1.0 standard drink of alcohol per week. She reports that she does not use drugs. Medical:  has a past medical history of Allergy, Anxiety, Basal cell carcinoma, Cardiomyopathy (HCC), CHF (congestive heart failure) (HCC), COVID-19, Diabetes type 2, uncontrolled, Heartburn, and Hypertension. Surgical: Ms. Katrina Kelly  has a past surgical history that includes Cholecystectomy (2008); Tonsillectomy (2000); Uvulectomy (2000); Nasal septum surgery (2000); Foot surgery (1982); uterine ablation; and Colonoscopy with propofol (N/A, 01/03/2017). Family: family history includes Breast cancer (age of onset: 27) in her paternal aunt; Cancer in her father; Diabetes in her brother, mother, and sister; Diabetes Mellitus II in her maternal grandmother; Emphysema in her father; Heart attack in her maternal uncle; Hypertension in her daughter and mother; Leukemia in her brother; Lymphoma in her paternal uncle; Polycystic ovary syndrome in her daughter; Stroke in her brother.  Laboratory Chemistry Profile   Renal Lab Results  Component Value Date   BUN 14 02/18/2023   CREATININE 1.04 02/18/2023   BCR NOT APPLICABLE 09/05/2020   GFR 58.03 (L) 02/18/2023    Hepatic Lab Results  Component Value Date   AST 17 02/18/2023   ALT 19 02/18/2023   ALBUMIN 3.8 02/18/2023   ALKPHOS 70 02/18/2023    Electrolytes Lab Results  Component Value Date   NA 140 02/18/2023   K 3.9 02/18/2023   CL 107 02/18/2023   CALCIUM 9.2 02/18/2023    Bone Lab Results  Component Value Date   VD25OH 8.62 (L) 04/09/2022    Inflammation (CRP: Acute Phase) (ESR: Chronic Phase) No results found for: "CRP", "ESRSEDRATE", "LATICACIDVEN"       Note: Above Lab results reviewed.   Physical Exam  General appearance: Well nourished, well developed, and well hydrated. In no  apparent acute distress Mental status: Alert, oriented x 3 (person, place, & time)       Respiratory: No evidence of acute respiratory distress Eyes: PERLA Vitals: BP 96/71 (BP Location: Right Arm, Patient Position: Sitting, Cuff Size: Large)   Pulse 93   Temp 98 F (36.7 C)   Ht 5\' 7"  (1.702 m)   Wt 190 lb (86.2 kg)   SpO2 98%   BMI 29.76 kg/m  BMI: Estimated body mass index is 29.76 kg/m as calculated from the following:   Height as of this encounter: 5\' 7"  (1.702 m).   Weight as of this encounter: 190 lb (86.2 kg). Ideal: Ideal body weight: 61.6 kg (135 lb 12.9 oz) Adjusted ideal body weight: 71.4 kg (157 lb 7.7 oz)  Positive left low back pain related to lumbar facet arthropathy  Assessment   Diagnosis Status  1. Lumbar spondylosis   2. Lumbar facet arthropathy   3. Chronic pain syndrome    Responding Responding Controlled   Updated Problems: No problems updated.  Plan of Care  Repeat diagnostic lumbar facet medial branch nerve block #2 and then consider RFA.   Katrina Kelly has a current medication list which includes the following long-term medication(s): atorvastatin, bupropion, carvedilol, cetirizine, furosemide, isosorbide mononitrate, lisinopril, pantoprazole, potassium chloride, and potassium.  Pharmacotherapy (Medications Ordered): Meds ordered this encounter  Medications   celecoxib (CELEBREX) 200 MG capsule    Sig: Take 1 capsule (200 mg total) by mouth daily.  Dispense:  30 capsule    Refill:  1   Orders:  Orders Placed This Encounter  Procedures   LUMBAR FACET(MEDIAL BRANCH NERVE BLOCK) MBNB    Standing Status:   Future    Standing Expiration Date:   09/25/2023    Scheduling Instructions:     Procedure: Lumbar facet block (AKA.: Lumbosacral medial branch nerve block)     Side: LEFT     Level: L3-4, L4-5,Facets ( L3, L4, L5,Medial Branch)     Sedation: IV Versed     Timeframe: ASAA    Order Specific Question:   Where will this  procedure be performed?    Answer:   [facility]   Follow-up plan:   Return in about 2 weeks (around 07/09/2023) for Left L3, 4, 5 MBNB #2, in clinic IV Versed.      Left L3,4,5 MBNB 05/27/22     Recent Visits Date Type Provider Dept  05/28/23 Procedure visit Edward Jolly, MD [facility]  05/06/23 Office Visit Edward Jolly, MD [facility]  Showing recent visits within past 90 days and meeting all other requirements Today's Visits Date Type Provider Dept  06/25/23 Office Visit Edward Jolly, MD [facility]  Showing today's visits and meeting all other requirements Future Appointments Date Type Provider Dept  07/14/23 Appointment Edward Jolly, MD [facility]  Showing future appointments within next 90 days and meeting all other requirements  I discussed the assessment and treatment plan with the patient. The patient was provided an opportunity to ask questions and all were answered. The patient agreed with the plan and demonstrated an understanding of the instructions.  Patient advised to call back or seek an in-person evaluation if the symptoms or condition worsens.  Duration of encounter: .  Total time on encounter, as per AMA guidelines included both the face-to-face and non-face-to-face time personally spent by the physician and/or other qualified health care professional(s) on the day of the encounter (includes time in activities that require the physician or other qualified health care professional and does not include time in activities normally performed by clinical staff). Physician's time may include the following activities when performed: Preparing to see the patient (e.g., pre-charting review of records, searching for previously ordered imaging, lab work, and nerve conduction tests) Review of prior analgesic pharmacotherapies. Reviewing PMP Interpreting ordered tests (e.g., lab work, imaging, nerve conduction  tests) Performing post-procedure evaluations, including interpretation of diagnostic procedures Obtaining and/or reviewing separately obtained history Performing a medically appropriate examination and/or evaluation Counseling and educating the patient/family/caregiver Ordering medications, tests, or procedures Referring and communicating with other health care professionals (when not separately reported) Documenting clinical information in the electronic or other health record Independently interpreting results (not separately reported) and communicating results to the patient/ family/caregiver Care coordination (not separately reported)  Note by: Edward Jolly, MD Date: 06/25/2023; Time: 3:46 PM

## 2023-07-14 ENCOUNTER — Ambulatory Visit: Admission: RE | Admit: 2023-07-14 | Payer: PRIVATE HEALTH INSURANCE | Source: Ambulatory Visit

## 2023-07-14 ENCOUNTER — Encounter: Payer: Self-pay | Admitting: Student in an Organized Health Care Education/Training Program

## 2023-07-14 ENCOUNTER — Ambulatory Visit
Payer: PRIVATE HEALTH INSURANCE | Attending: Student in an Organized Health Care Education/Training Program | Admitting: Student in an Organized Health Care Education/Training Program

## 2023-07-14 VITALS — BP 93/71 | Temp 97.5°F | Resp 18 | Ht 67.0 in | Wt 190.0 lb

## 2023-07-14 DIAGNOSIS — G894 Chronic pain syndrome: Secondary | ICD-10-CM | POA: Diagnosis present

## 2023-07-14 DIAGNOSIS — M47816 Spondylosis without myelopathy or radiculopathy, lumbar region: Secondary | ICD-10-CM | POA: Insufficient documentation

## 2023-07-14 MED ORDER — LACTATED RINGERS IV SOLN: Freq: Once | INTRAVENOUS | Status: AC

## 2023-07-14 MED ORDER — ROPIVACAINE HCL 2 MG/ML IJ SOLN
9.0000 mL | Freq: Once | INTRAMUSCULAR | Status: AC
  Administered 2023-07-14: 9 mL via PERINEURAL
  Filled 2023-07-14: qty 20

## 2023-07-14 MED ORDER — MIDAZOLAM HCL 2 MG/2ML IJ SOLN
0.5000 mg | Freq: Once | INTRAMUSCULAR | Status: AC
  Administered 2023-07-14: 2 mg via INTRAVENOUS
  Filled 2023-07-14: qty 2

## 2023-07-14 MED ORDER — LIDOCAINE HCL 2 % IJ SOLN
20.0000 mL | Freq: Once | INTRAMUSCULAR | Status: AC
  Administered 2023-07-14: 400 mg
  Filled 2023-07-14: qty 40

## 2023-07-14 MED ORDER — DEXAMETHASONE SODIUM PHOSPHATE 10 MG/ML IJ SOLN
10.0000 mg | Freq: Once | INTRAMUSCULAR | Status: AC
  Administered 2023-07-14: 10 mg
  Filled 2023-07-14: qty 1

## 2023-07-14 NOTE — Patient Instructions (Signed)

## 2023-07-14 NOTE — Progress Notes (Signed)
PROVIDER NOTE: Interpretation of information contained herein should be left to medically-trained personnel. Specific patient instructions are provided elsewhere under "Patient Instructions" section of medical record. This document was created in part using STT-dictation technology, any transcriptional errors that may result from this process are unintentional.  Patient: Katrina Kelly Type: Established DOB: 06/18/1961 MRN: [id number] PCP: Allegra Grana, FNP  Service: Procedure DOS: 07/14/2023 Setting: Ambulatory Location: Ambulatory outpatient facility Delivery: Face-to-face Provider: Edward Jolly, MD Specialty: Interventional Pain Management Specialty designation: 09 Location: Outpatient facility Ref. Prov.: Allegra Grana, FNP       Interventional Therapy   Procedure: Lumbar Facet, Medial Branch Block(s) #2  Laterality: Left  Level: L3, L4, and L5 Medial Branch Level(s). Injecting these levels blocks the L3-4 and L4-5 lumbar facet joints. Imaging: Fluoroscopic guidance         Anesthesia: Local anesthesia (1-2% Lidocaine) Anxiolysis: IV Versed         DOS: 07/14/2023 Performed by: Edward Jolly, MD  Primary Purpose: Diagnostic/Therapeutic Indications: Low back pain severe enough to impact quality of life or function. 1. Lumbar spondylosis   2. Lumbar facet arthropathy   3. Chronic pain syndrome    NAS-11 Pain score:   Pre-procedure: 5 /10   Post-procedure: 2 /10     Position / Prep / Materials:  Position: Prone  Prep solution: DuraPrep (Iodine Povacrylex [0.7% available iodine] and Isopropyl Alcohol, 74% w/w) Area Prepped: Posterolateral Lumbosacral Spine (Wide prep: From the lower border of the scapula down to the end of the tailbone and from flank to flank.)  Materials:  Tray: Block Needle(s):  Type: Spinal  Gauge (G): 22  Length: 5-in Qty: 2      Pre-op H&P Assessment:  Katrina Kelly is a 62 y.o. (year old), female patient, seen today for interventional  treatment. She  has a past surgical history that includes Cholecystectomy (2008); Tonsillectomy (2000); Uvulectomy (2000); Nasal septum surgery (2000); Foot surgery (1982); uterine ablation; and Colonoscopy with propofol (N/A, 01/03/2017). Katrina Kelly has a current medication list which includes the following prescription(s): acetaminophen, atorvastatin, bupropion, carvedilol, celecoxib, cetirizine, cholecalciferol, cyanocobalamin, furosemide, isosorbide mononitrate, onetouch delica plus lancet30g, lisinopril, b-d disp needle 25gx1", nitroglycerin, pantoprazole, potassium chloride, potassium, ozempic (1 mg/dose), and bd eclipse syringe, and the following Facility-Administered Medications: lactated ringers. Her primarily concern today is the Back Pain (lower)  Initial Vital Signs:  Pulse/HCG Rate:  ECG Heart Rate: 95 Temp: (!) 97.5 F (36.4 C) Resp: 18 BP: 96/73 SpO2: 98 %  BMI: Estimated body mass index is 29.76 kg/m as calculated from the following:   Height as of this encounter: 5\' 7"  (1.702 m).   Weight as of this encounter: 190 lb (86.2 kg).  Risk Assessment: Allergies: Reviewed. She is allergic to keflex [cephalexin], metformin and related, and lactose intolerance (gi).  Allergy Precautions: None required Coagulopathies: Reviewed. None identified.  Blood-thinner therapy: None at this time Active Infection(s): Reviewed. None identified. Katrina Kelly is afebrile  Site Confirmation: Katrina Kelly was asked to confirm the procedure and laterality before marking the site Procedure checklist: Completed Consent: Before the procedure and under the influence of no sedative(s), amnesic(s), or anxiolytics, the patient was informed of the treatment options, risks and possible complications. To fulfill our ethical and legal obligations, as recommended by the American Medical Association's Code of Ethics, I have informed the patient of my clinical impression; the nature and purpose of the treatment or  procedure; the risks, benefits, and possible complications of the intervention; the alternatives, including doing  nothing; the risk(s) and benefit(s) of the alternative treatment(s) or procedure(s); and the risk(s) and benefit(s) of doing nothing. The patient was provided information about the general risks and possible complications associated with the procedure. These may include, but are not limited to: failure to achieve desired goals, infection, bleeding, organ or nerve damage, allergic reactions, paralysis, and death. In addition, the patient was informed of those risks and complications associated to Spine-related procedures, such as failure to decrease pain; infection (i.e.: Meningitis, epidural or intraspinal abscess); bleeding (i.e.: epidural hematoma, subarachnoid hemorrhage, or any other type of intraspinal or peri-dural bleeding); organ or nerve damage (i.e.: Any type of peripheral nerve, nerve root, or spinal cord injury) with subsequent damage to sensory, motor, and/or autonomic systems, resulting in permanent pain, numbness, and/or weakness of one or several areas of the body; allergic reactions; (i.e.: anaphylactic reaction); and/or death. Furthermore, the patient was informed of those risks and complications associated with the medications. These include, but are not limited to: allergic reactions (i.e.: anaphylactic or anaphylactoid reaction(s)); adrenal axis suppression; blood sugar elevation that in diabetics may result in ketoacidosis or comma; water retention that in patients with history of congestive heart failure may result in shortness of breath, pulmonary edema, and decompensation with resultant heart failure; weight gain; swelling or edema; medication-induced neural toxicity; particulate matter embolism and blood vessel occlusion with resultant organ, and/or nervous system infarction; and/or aseptic necrosis of one or more joints. Finally, the patient was informed that Medicine is  not an exact science; therefore, there is also the possibility of unforeseen or unpredictable risks and/or possible complications that may result in a catastrophic outcome. The patient indicated having understood very clearly. We have given the patient no guarantees and we have made no promises. Enough time was given to the patient to ask questions, all of which were answered to the patient's satisfaction. Katrina Kelly has indicated that she wanted to continue with the procedure. Attestation: I, the ordering provider, attest that I have discussed with the patient the benefits, risks, side-effects, alternatives, likelihood of achieving goals, and potential problems during recovery for the procedure that I have provided informed consent. Date  Time: 07/14/2023 10:30 AM   Pre-Procedure Preparation:  Monitoring: As per clinic protocol. Respiration, ETCO2, SpO2, BP, heart rate and rhythm monitor placed and checked for adequate function Safety Precautions: Patient was assessed for positional comfort and pressure points before starting the procedure. Time-out: I initiated and conducted the "Time-out" before starting the procedure, as per protocol. The patient was asked to participate by confirming the accuracy of the "Time Out" information. Verification of the correct person, site, and procedure were performed and confirmed by me, the nursing staff, and the patient. "Time-out" conducted as per Joint Commission's Universal Protocol (UP.01.01.01). Time: 1130 Start Time: 1130 hrs.  Description of Procedure:          Laterality: (see above) Targeted Levels: (see above)  Safety Precautions: Aspiration looking for blood return was conducted prior to all injections. At no point did we inject any substances, as a needle was being advanced. Before injecting, the patient was told to immediately notify me if she was experiencing any new onset of "ringing in the ears, or metallic taste in the mouth". No attempts were  made at seeking any paresthesias. Safe injection practices and needle disposal techniques used. Medications properly checked for expiration dates. SDV (single dose vial) medications used. After the completion of the procedure, all disposable equipment used was discarded in the proper designated medical  waste containers. Local Anesthesia: Protocol guidelines were followed. The patient was positioned over the fluoroscopy table. The area was prepped in the usual manner. The time-out was completed. The target area was identified using fluoroscopy. A 12-in long, straight, sterile hemostat was used with fluoroscopic guidance to locate the targets for each level blocked. Once located, the skin was marked with an approved surgical skin marker. Once all sites were marked, the skin (epidermis, dermis, and hypodermis), as well as deeper tissues (fat, connective tissue and muscle) were infiltrated with a small amount of a short-acting local anesthetic, loaded on a 10cc syringe with a 25G, 1.5-in  Needle. An appropriate amount of time was allowed for local anesthetics to take effect before proceeding to the next step. Local Anesthetic: Lidocaine 2.0% The unused portion of the local anesthetic was discarded in the proper designated containers. Technical description of process:  L3 Medial Branch Nerve Block (MBB): The target area for the L3 medial branch is at the junction of the postero-lateral aspect of the superior articular process and the superior, posterior, and medial edge of the transverse process of L4. Under fluoroscopic guidance, a Quincke needle was inserted until contact was made with os over the superior postero-lateral aspect of the pedicular shadow (target area). After negative aspiration for blood, 2mL of the nerve block solution was injected without difficulty or complication. The needle was removed intact. L4 Medial Branch Nerve Block (MBB): The target area for the L4 medial branch is at the junction of the  postero-lateral aspect of the superior articular process and the superior, posterior, and medial edge of the transverse process of L5. Under fluoroscopic guidance, a Quincke needle was inserted until contact was made with os over the superior postero-lateral aspect of the pedicular shadow (target area). After negative aspiration for blood, 2mL of the nerve block solution was injected without difficulty or complication. The needle was removed intact. L5 Medial Branch Nerve Block (MBB): The target area for the L5 medial branch is at the junction of the postero-lateral aspect of the superior articular process and the superior, posterior, and medial edge of the sacral ala. Under fluoroscopic guidance, a Quincke needle was inserted until contact was made with os over the superior postero-lateral aspect of the pedicular shadow (target area). After negative aspiration for blood, 2mL of the nerve block solution was injected without difficulty or complication. The needle was removed intact.   Once the entire procedure was completed, the treated area was cleaned, making sure to leave some of the prepping solution back to take advantage of its long term bactericidal properties.         Illustration of the posterior view of the lumbar spine and the posterior neural structures. Laminae of L2 through S1 are labeled. DPRL5, dorsal primary ramus of L5; DPRS1, dorsal primary ramus of S1; DPR3, dorsal primary ramus of L3; FJ, facet (zygapophyseal) joint L3-L4; I, inferior articular process of L4; LB1, lateral branch of dorsal primary ramus of L1; IAB, inferior articular branches from L3 medial branch (supplies L4-L5 facet joint); IBP, intermediate branch plexus; MB3, medial branch of dorsal primary ramus of L3; NR3, third lumbar nerve root; S, superior articular process of L5; SAB, superior articular branches from L4 (supplies L4-5 facet joint also); TP3, transverse process of L3.   Facet Joint Innervation (* possible  contribution)  L1-2 T12, L1 (L2*)  Medial Branch  L2-3 L1, L2 (L3*)         "          "  L3-4 L2, L3 (L4*)         "          "  L4-5 L3, L4 (L5*)         "          "  L5-S1 L4, L5, S1          "          "    Vitals:   07/14/23 1126 07/14/23 1131 07/14/23 1134 07/14/23 1140  BP: 96/73 92/74 104/72 93/71  Resp: 18 17 18 18   Temp:      TempSrc:      SpO2: 98% 95% 96% 98%  Weight:      Height:         End Time: 1134 hrs.  Imaging Guidance (Spinal):          Type of Imaging Technique: Fluoroscopy Guidance (Spinal) Indication(s): Assistance in needle guidance and placement for procedures requiring needle placement in or near specific anatomical locations not easily accessible without such assistance. Exposure Time: Please see nurses notes. Contrast: None used. Fluoroscopic Guidance: I was personally present during the use of fluoroscopy. "Tunnel Vision Technique" used to obtain the best possible view of the target area. Parallax error corrected before commencing the procedure. "Direction-depth-direction" technique used to introduce the needle under continuous pulsed fluoroscopy. Once target was reached, antero-posterior, oblique, and lateral fluoroscopic projection used confirm needle placement in all planes. Images permanently stored in EMR. Interpretation: No contrast injected. I personally interpreted the imaging intraoperatively. Adequate needle placement confirmed in multiple planes. Permanent images saved into the patient's record.  Post-operative Assessment:  Post-procedure Vital Signs:  Pulse/HCG Rate:  92 Temp: (!) 97.5 F (36.4 C) Resp: 18 BP: 93/71 SpO2: 98 %  EBL: None  Complications: No immediate post-treatment complications observed by team, or reported by patient.  Note: The patient tolerated the entire procedure well. A repeat set of vitals were taken after the procedure and the patient was kept under observation following institutional policy, for this type of  procedure. Post-procedural neurological assessment was performed, showing return to baseline, prior to discharge. The patient was provided with post-procedure discharge instructions, including a section on how to identify potential problems. Should any problems arise concerning this procedure, the patient was given instructions to immediately contact us, at any time, without hesitation. In any case, we plan to contact the patient by telephone for a follow-up status report regarding this interventional procedure.  Comments:  No additional relevant information.  Plan of Care (POC)  Orders:  Orders Placed This Encounter  Procedures   DG PAIN CL[facility] C-ARM 1-60 MIN NO REPORT    Intraoperative interpretation by procedural physician at [facility].    Standing Status:   Standing    Number of Occurrences:   1    Order Specific Question:   Reason for exam:    Answer:   Assistance in needle guidance and placement for procedures requiring needle placement in or near specific anatomical locations not easily accessible without such assistance.    Medications ordered for procedure: Meds ordered this encounter  Medications   lidocaine (XYLOCAINE) 2 % (with pres) injection 400 mg   lactated ringers infusion   midazolam (VERSED) injection 0.5-2 mg    Make sure Flumazenil is available in the pyxis when using this medication. If oversedation occurs, administer 0.2 mg IV over 15 sec. If after 45 sec no response, administer 0.2 mg again over 1 min; may repeat  at 1 min intervals; not to exceed 4 doses (1 mg)   dexamethasone (DECADRON) injection 10 mg   ropivacaine (PF) 2 mg/mL (0.2%) (NAROPIN) injection 9 mL   Medications administered: We administered lidocaine, lactated ringers, midazolam, dexamethasone, and ropivacaine (PF) 2 mg/mL (0.2%).  See the medical record for exact dosing, route, and time of administration.  Follow-up plan:   Return in about 3 weeks (around 08/04/2023) for Post Procedure  Evaluation, virtual.       Left L3,4,5 MBNB 05/27/22, #2 07/14/23    Recent Visits Date Type Provider Dept  06/25/23 Office Visit Edward Jolly, MD [facility]  05/28/23 Procedure visit Edward Jolly, MD [facility]  05/06/23 Office Visit Edward Jolly, MD [facility]  Showing recent visits within past 90 days and meeting all other requirements Today's Visits Date Type Provider Dept  07/14/23 Procedure visit Edward Jolly, MD [facility]  Showing today's visits and meeting all other requirements Future Appointments Date Type Provider Dept  08/06/23 Appointment Edward Jolly, MD [facility]  Showing future appointments within next 90 days and meeting all other requirements  Disposition: Discharge home  Discharge (Date  Time): 07/14/2023;   hrs.   Primary Care Physician: Allegra Grana, FNP Location: [facility] Note by: Edward Jolly, MD (TTS technology used. I apologize for any typographical errors that were not detected and corrected.) Date: 07/14/2023; Time: 12:00 PM  Disclaimer:  Medicine is not an Visual merchandiser. The only guarantee in medicine is that nothing is guaranteed. It is important to note that the decision to proceed with this intervention was based on the information collected from the patient. The Data and conclusions were drawn from the patient's questionnaire, the interview, and the physical examination. Because the information was provided in large part by the patient, it cannot be guaranteed that it has not been purposely or unconsciously manipulated. Every effort has been made to obtain as much relevant data as possible for this evaluation. It is important to note that the conclusions that lead to this procedure are derived in large part from the available data. Always take into account that the treatment will also be dependent on availability of resources and existing treatment guidelines,  considered by other Pain Management Practitioners as being common knowledge and practice, at the time of the intervention. For Medico-Legal purposes, it is also important to point out that variation in procedural techniques and pharmacological choices are the acceptable norm. The indications, contraindications, technique, and results of the above procedure should only be interpreted and judged by a Board-Certified Interventional Pain Specialist with extensive familiarity and expertise in the same exact procedure and technique.

## 2023-07-15 ENCOUNTER — Telehealth: Payer: Self-pay

## 2023-07-15 NOTE — Telephone Encounter (Signed)
Post procedure follow up.  Patient states she is doing good.  

## 2023-07-17 ENCOUNTER — Other Ambulatory Visit: Payer: Self-pay | Admitting: Family

## 2023-07-17 DIAGNOSIS — I10 Essential (primary) hypertension: Secondary | ICD-10-CM

## 2023-07-17 DIAGNOSIS — E118 Type 2 diabetes mellitus with unspecified complications: Secondary | ICD-10-CM

## 2023-08-05 ENCOUNTER — Telehealth: Payer: Self-pay

## 2023-08-05 NOTE — Telephone Encounter (Signed)
LM for patient to call office for pre virtual appointment questions.  

## 2023-08-06 ENCOUNTER — Ambulatory Visit
Payer: PRIVATE HEALTH INSURANCE | Attending: Student in an Organized Health Care Education/Training Program | Admitting: Student in an Organized Health Care Education/Training Program

## 2023-08-06 DIAGNOSIS — M47816 Spondylosis without myelopathy or radiculopathy, lumbar region: Secondary | ICD-10-CM

## 2023-08-06 NOTE — Progress Notes (Signed)
I attempted to call the patient however no response.  -Dr Lateef  

## 2023-08-14 ENCOUNTER — Encounter: Payer: Self-pay | Admitting: Student in an Organized Health Care Education/Training Program

## 2023-08-14 ENCOUNTER — Ambulatory Visit
Payer: PRIVATE HEALTH INSURANCE | Attending: Student in an Organized Health Care Education/Training Program | Admitting: Student in an Organized Health Care Education/Training Program

## 2023-08-14 DIAGNOSIS — M47816 Spondylosis without myelopathy or radiculopathy, lumbar region: Secondary | ICD-10-CM

## 2023-08-14 DIAGNOSIS — G894 Chronic pain syndrome: Secondary | ICD-10-CM

## 2023-08-14 NOTE — Progress Notes (Signed)
Patient: Katrina Kelly  Service Category: E/M  Provider: Edward Jolly, MD  DOB: [birth year]  DOS: 08/14/2023  Location: Office  MRN: [id number]  Setting: Ambulatory outpatient  Referring Provider: Allegra Grana, FNP  Type: Established Patient  Specialty: Interventional Pain Management  PCP: Allegra Grana, FNP  Location: Remote location  Delivery: TeleHealth     Virtual Encounter - Pain Management PROVIDER NOTE: Information contained herein reflects review and annotations entered in association with encounter. Interpretation of such information and data should be left to medically-trained personnel. Information provided to patient can be located elsewhere in the medical record under "Patient Instructions". Document created using STT-dictation technology, any transcriptional errors that may result from process are unintentional.    Contact & Pharmacy Preferred: [phone] Home: [phone] (home) Mobile: [phone] (mobile) E-mail: [email or web address] [phone] [place], [place] - [address] AT [facility] OF [address] [place] [address] Phone: [phone] Fax: [phone]  [facility] #[id number] [place], [place] - [address] AT [facility] OF [facility] [address] & [facility] [address] [place] [address] Phone: [phone] Fax: [phone]  [facility] #[id number] [facility] [place] - [address] AT [facility] [address] & [place]cy [place] [address] [place] [address] Phone: [phone] Fax: [phone]   Pre-screening  Katrina Kelly offered "in-person" vs "virtual" encounter. She indicated preferring virtual for this encounter.   Reason COVID-19*  Social distancing based on CDC and AMA recommendations.   I contacted Katrina Kelly on 08/14/2023 via telephone.      I clearly identified myself as Edward Jolly, MD. I verified that I was speaking with the correct person using two identifiers (Name: Katrina Kelly, and date of birth: 17-Aug-1961).  Consent I sought verbal advanced consent from Katrina Kelly for virtual visit interactions. I informed Katrina Kelly of possible security and privacy concerns, risks, and limitations associated with providing "not-in-person" medical evaluation and management services. I also informed Katrina Kelly of the availability of "in-person" appointments. Finally, I informed her that there would be a charge for the virtual visit and that she could be  personally, fully or partially, financially responsible for it. Katrina Kelly expressed understanding and agreed to proceed.   Historic Elements   Katrina Kelly is a 62 y.o. year old, female patient evaluated today after our last contact on 08/06/2023. Katrina Kelly  has a past medical history of Allergy, Anxiety, Basal cell carcinoma, Cardiomyopathy (HCC), CHF (congestive heart failure) (HCC), COVID-19, Diabetes type 2, uncontrolled, Heartburn, and Hypertension. She also  has a past surgical history that includes Cholecystectomy (2008); Tonsillectomy (2000); Uvulectomy (2000); Nasal septum surgery (2000); Foot surgery (1982); uterine ablation; and Colonoscopy with propofol (N/A, 01/03/2017). Katrina Kelly has a current medication list which includes the following prescription(s): acetaminophen, atorvastatin, bupropion, carvedilol, celecoxib, cetirizine, cholecalciferol, cyanocobalamin, furosemide, isosorbide mononitrate, onetouch delica plus lancet30g, lisinopril, b-d disp needle 25gx1", nitroglycerin, pantoprazole, potassium chloride, potassium, ozempic (1 mg/dose), and bd eclipse syringe. She  reports that she quit smoking about 38 years ago. Her smoking use included cigarettes. She started smoking about 43 years ago. She has a 1.3 pack-year smoking history. She has never used smokeless tobacco. She reports current alcohol use of about 1.0 standard drink of alcohol per week. She reports that she does not use drugs. Katrina Kelly is allergic to  keflex [cephalexin], metformin and related, and lactose intolerance (gi).  BMI: Estimated body mass index is  29.76 kg/m as calculated from the following:   Height as of 07/14/23: 5\' 7"  (1.702 m).   Weight as of 07/14/23: 190 lb (86.2 kg). Last encounter: 08/06/2023. Last procedure: 07/14/2023.  HPI  Today, she is being contacted for a post-procedure assessment.   Post-procedure evaluation   Procedure: Lumbar Facet, Medial Branch Block(s) #2  Laterality: Left  Level: L3, L4, and L5 Medial Branch Level(s). Injecting these levels blocks the L3-4 and L4-5 lumbar facet joints. Imaging: Fluoroscopic guidance         Anesthesia: Local anesthesia (1-2% Lidocaine) Anxiolysis: IV Versed         DOS: 07/14/2023 Performed by: Edward Jolly, MD  Primary Purpose: Diagnostic/Therapeutic Indications: Low back pain severe enough to impact quality of life or function. 1. Lumbar spondylosis   2. Lumbar facet arthropathy   3. Chronic pain syndrome    NAS-11 Pain score:   Pre-procedure: 5 /10   Post-procedure: 2 /10      Effectiveness:  Initial hour after procedure: 100 %  Subsequent 4-6 hours post-procedure: 100 %  Analgesia past initial 6 hours: 70 % (Curent)  Ongoing improvement:  Analgesic:  60-70% Function: Somewhat improved ROM: Somewhat improved   Laboratory Chemistry Profile   Renal Lab Results  Component Value Date   BUN 14 02/18/2023   CREATININE 1.04 02/18/2023   BCR NOT APPLICABLE 09/05/2020   GFR 58.03 (L) 02/18/2023    Hepatic Lab Results  Component Value Date   AST 17 02/18/2023   ALT 19 02/18/2023   ALBUMIN 3.8 02/18/2023   ALKPHOS 70 02/18/2023    Electrolytes Lab Results  Component Value Date   NA 140 02/18/2023   K 3.9 02/18/2023   CL 107 02/18/2023   CALCIUM 9.2 02/18/2023    Bone Lab Results  Component Value Date   VD25OH 8.62 (L) 04/09/2022    Inflammation (CRP: Acute Phase) (ESR: Chronic Phase) No results found for: "CRP", "ESRSEDRATE",  "LATICACIDVEN"       Note: Above Lab results reviewed.   Assessment  The primary encounter diagnosis was Lumbar spondylosis. Diagnoses of Lumbar facet arthropathy and Chronic pain syndrome were also pertinent to this visit.  Plan of Care  Positive response to her second set of left L3, L4, L5 medial branch nerve block #2.  Patient states that her low back pain is better although it is still present.  She is more functional and able to complete ADLs more comfortably.  At this point, she would like to wait to do the RFA until her pain levels increase.  This is reasonable.  As needed order placed for left L3, L4, L5 RFA with sedation as needed status post 2 positive diagnostic lumbar facet medial branch nerve blocks done on 05/28/2023 and 07/14/2023.   Orders:  Orders Placed This Encounter  Procedures   Radiofrequency,Lumbar    Standing Status:   Standing    Number of Occurrences:   2    Standing Expiration Date:   08/13/2024    Scheduling Instructions:     Side(s): LEFT     Level(s): L3, L4, L5, Medial Branch Nerve(s)     Sedation: With Sedation     Scheduling Timeframe: PRN- patient will call to schedule    Order Specific Question:   Where will this procedure be performed?    Answer:   [facility]   Follow-up plan:   Return for patient will call to schedule RFA prn.      Left L3,4,5 MBNB 05/27/22, #  2 07/14/23     Recent Visits Date Type Provider Dept  07/14/23 Procedure visit Edward Jolly, MD [facility]  06/25/23 Office Visit Edward Jolly, MD [facility]  05/28/23 Procedure visit Edward Jolly, MD [facility]  Showing recent visits within past 90 days and meeting all other requirements Today's Visits Date Type Provider Dept  08/14/23 Office Visit Edward Jolly, MD [facility]  Showing today's visits and meeting all other requirements Future Appointments No visits were found meeting these conditions. Showing future  appointments within next 90 days and meeting all other requirements  I discussed the assessment and treatment plan with the patient. The patient was provided an opportunity to ask questions and all were answered. The patient agreed with the plan and demonstrated an understanding of the instructions.  Patient advised to call back or seek an in-person evaluation if the symptoms or condition worsens.  Duration of encounter: .  Note by: Edward Jolly, MD Date: 08/14/2023; Time: 5:05 PM

## 2023-08-15 ENCOUNTER — Telehealth: Payer: Self-pay

## 2023-08-15 NOTE — Telephone Encounter (Signed)
Called patient to review pre op instructions. She states she is going to wait a few months before scheduling. Instructed her to ask for nurse so that we could review instructions with her.

## 2023-09-04 ENCOUNTER — Ambulatory Visit: Payer: PRIVATE HEALTH INSURANCE | Admitting: Family

## 2023-09-04 ENCOUNTER — Encounter: Payer: Self-pay | Admitting: Family

## 2023-09-04 VITALS — BP 120/80 | HR 98 | Temp 97.8°F | Ht 67.0 in | Wt 198.0 lb

## 2023-09-04 DIAGNOSIS — Z7985 Long-term (current) use of injectable non-insulin antidiabetic drugs: Secondary | ICD-10-CM | POA: Diagnosis not present

## 2023-09-04 DIAGNOSIS — E118 Type 2 diabetes mellitus with unspecified complications: Secondary | ICD-10-CM | POA: Diagnosis not present

## 2023-09-04 DIAGNOSIS — Z23 Encounter for immunization: Secondary | ICD-10-CM | POA: Diagnosis not present

## 2023-09-04 DIAGNOSIS — R5383 Other fatigue: Secondary | ICD-10-CM | POA: Diagnosis not present

## 2023-09-04 DIAGNOSIS — F320 Major depressive disorder, single episode, mild: Secondary | ICD-10-CM

## 2023-09-04 DIAGNOSIS — I10 Essential (primary) hypertension: Secondary | ICD-10-CM | POA: Diagnosis not present

## 2023-09-04 LAB — COMPREHENSIVE METABOLIC PANEL
ALT: 24 U/L (ref 0–35)
AST: 28 U/L (ref 0–37)
Albumin: 3.9 g/dL (ref 3.5–5.2)
Alkaline Phosphatase: 59 U/L (ref 39–117)
BUN: 11 mg/dL (ref 6–23)
CO2: 24 mEq/L (ref 19–32)
Calcium: 8.9 mg/dL (ref 8.4–10.5)
Chloride: 108 mEq/L (ref 96–112)
Creatinine, Ser: 0.98 mg/dL (ref 0.40–1.20)
GFR: 62.08 mL/min (ref >60.00)
Glucose, Bld: 126 mg/dL — ABNORMAL HIGH (ref 70–99)
Potassium: 3.8 mEq/L (ref 3.5–5.1)
Sodium: 141 mEq/L (ref 135–145)
Total Bilirubin: 0.8 mg/dL (ref 0.2–1.2)
Total Protein: 6.5 g/dL (ref 6.0–8.3)

## 2023-09-04 LAB — HEMOGLOBIN A1C: Hgb A1c MFr Bld: 6.5 % (ref 4.6–6.5)

## 2023-09-04 MED ORDER — SEMAGLUTIDE (2 MG/DOSE) 8 MG/3ML ~~LOC~~ SOPN: 2.0000 mg | PEN_INJECTOR | SUBCUTANEOUS | 3 refills | Status: DC

## 2023-09-04 MED ORDER — BUPROPION HCL ER (XL) 150 MG PO TB24
150.0000 mg | ORAL_TABLET | Freq: Every day | ORAL | 3 refills | Status: DC
Start: 2023-09-04 — End: 2023-10-08

## 2023-09-04 NOTE — Patient Instructions (Signed)
Increase Ozempic to 2 mg weekly.

## 2023-09-04 NOTE — Progress Notes (Signed)
Assessment & Plan:  Controlled type 2 diabetes mellitus with complication, without long-term current use of insulin (HCC) Assessment & Plan: Pending A1c.  For additional benefit of weight loss, increase Ozempic to 2 mg daily.  Orders: -     Hemoglobin A1c -     Comprehensive metabolic panel -     Semaglutide (2 MG/DOSE); Inject 2 mg as directed once a week.  Dispense: 3 mL; Refill: 3  Depression, major, single episode, mild (HCC) -     buPROPion HCl ER (XL); Take 1 tablet (150 mg total) by mouth daily.  Dispense: 90 tablet; Refill: 3  Other fatigue  Hypertension, unspecified type Assessment & Plan: Chronic, stable.  Continue Coreg 6.5 mg twice daily, lisinopril 20 mg,imdur 30 mg QD      Return precautions given.   Risks, benefits, and alternatives of the medications and treatment plan prescribed today were discussed, and patient expressed understanding.   Education regarding symptom management and diagnosis given to patient on AVS either electronically or printed.  Return in about 3 months (around 12/04/2023).  Rennie Plowman, FNP  Subjective:    Patient ID: Katrina Kelly, female    DOB: 1961/04/27, 62 y.o.   MRN: [id number]  CC: Katrina Kelly is a 62 y.o. female who presents today for follow up.   HPI: Overall feels well today.  She has noticed weight gain.  Endorses dietary discretion.  Interested in increasing Ozempic No leg swelling, orthopnea, shortness of breath    Following with Dr Cherylann Ratel last seen 08/14/2023; improvement after medial branch nerve block #2  Mammogram is scheduled  Shingrix 07/17/2022.  Due for second dose.  Allergies: Keflex [cephalexin], Metformin and related, and Lactose intolerance (gi) Current Outpatient Medications on File Prior to Visit  Medication Sig Dispense Refill   acetaminophen (TYLENOL) 500 MG tablet Take 500 mg by mouth every 6 (six) hours as needed.     atorvastatin (LIPITOR) 10 MG tablet TAKE 1 TABLET(10 MG) BY MOUTH DAILY 90  tablet 3   carvedilol (COREG) 6.25 MG tablet TAKE 1 TABLET(6.25 MG) BY MOUTH TWICE DAILY WITH A MEAL 120 tablet 3   cetirizine (ZYRTEC) 10 MG tablet Take 10 mg by mouth daily.     Cholecalciferol 1.25 MG (50000 UT) TABS 50,000 units PO qwk for 8 weeks. 8 tablet 0   cyanocobalamin (VITAMIN B12) 1000 MCG/ML injection 1000 mcg (1 mL) intramuscular injection in the thigh ( vastus lateralis) once per month. 3 mL 4   furosemide (LASIX) 40 MG tablet Take 40 mg by mouth daily as needed for edema.      isosorbide mononitrate (IMDUR) 30 MG 24 hr tablet TAKE 1 TABLET(30 MG) BY MOUTH AT BEDTIME 90 tablet 3   Lancets (ONETOUCH DELICA PLUS LANCET30G) MISC USE AS DIRECTED 100 each 0   lisinopril (ZESTRIL) 10 MG tablet TAKE 1 TABLET(10 MG) BY MOUTH DAILY 90 tablet 3   NEEDLE, DISP, 25 G (B-D DISP NEEDLE 25GX1") 25G X 1" MISC Used to give B-12 injections. 10 each 0   nitroGLYCERIN (NITROSTAT) 0.4 MG SL tablet      pantoprazole (PROTONIX) 40 MG tablet Take 1 tablet (40 mg total) by mouth daily as needed. TAKE 1 TABLET(40 MG) BY MOUTH DAILY 90 tablet 1   potassium chloride (KLOR-CON) 10 MEQ tablet Take 1 tablet (10 mEq total) by mouth daily as needed (swelling). 30 tablet 1   POTASSIUM PO Take 1 tablet by mouth daily.     SYRINGE/NEEDLE, DISP, 1 ML (BD  ECLIPSE SYRINGE) 25G X 5/8" 1 ML MISC Used to give B-12 injections. 50 each 0   No current facility-administered medications on file prior to visit.    Review of Systems  Constitutional:  Negative for chills and fever.  Respiratory:  Negative for cough and shortness of breath.   Cardiovascular:  Negative for chest pain, palpitations and leg swelling.  Gastrointestinal:  Negative for nausea and vomiting.      Objective:    BP 120/80   Pulse 98   Temp 97.8 F (36.6 C) (Oral)   Ht 5\' 7"  (1.702 m)   Wt 198 lb (89.8 kg)   SpO2 97%   BMI 31.01 kg/m  BP Readings from Last 3 Encounters:  09/04/23 120/80  07/14/23 93/71  06/25/23 96/71   Wt Readings from  Last 3 Encounters:  09/04/23 198 lb (89.8 kg)  07/14/23 190 lb (86.2 kg)  06/25/23 190 lb (86.2 kg)    Physical Exam Vitals reviewed.  Constitutional:      Appearance: She is well-developed.  Eyes:     Conjunctiva/sclera: Conjunctivae normal.  Cardiovascular:     Rate and Rhythm: Normal rate and regular rhythm.     Pulses: Normal pulses.     Heart sounds: Normal heart sounds.  Pulmonary:     Effort: Pulmonary effort is normal.     Breath sounds: Normal breath sounds. No wheezing, rhonchi or rales.  Skin:    General: Skin is warm and dry.  Neurological:     Mental Status: She is alert.  Psychiatric:        Speech: Speech normal.        Behavior: Behavior normal.        Thought Content: Thought content normal.

## 2023-09-04 NOTE — Assessment & Plan Note (Signed)
Pending A1c.  For additional benefit of weight loss, increase Ozempic to 2 mg daily.

## 2023-09-04 NOTE — Addendum Note (Signed)
Addended by: Kristie Cowman on: 09/04/2023 08:47 AM   Modules accepted: Orders

## 2023-09-04 NOTE — Assessment & Plan Note (Signed)
Chronic, stable.  Continue Coreg 6.5 mg twice daily, lisinopril 20 mg,imdur 30 mg QD

## 2023-09-06 ENCOUNTER — Other Ambulatory Visit: Payer: Self-pay | Admitting: Student in an Organized Health Care Education/Training Program

## 2023-09-18 ENCOUNTER — Telehealth: Payer: Self-pay | Admitting: Family

## 2023-09-18 ENCOUNTER — Telehealth: Payer: Self-pay | Admitting: Student in an Organized Health Care Education/Training Program

## 2023-09-18 ENCOUNTER — Encounter: Payer: PRIVATE HEALTH INSURANCE | Admitting: Student in an Organized Health Care Education/Training Program

## 2023-09-18 ENCOUNTER — Other Ambulatory Visit: Payer: Self-pay | Admitting: *Deleted

## 2023-09-18 NOTE — Telephone Encounter (Signed)
RX request sent to Dr. Cherylann Ratel.

## 2023-09-18 NOTE — Telephone Encounter (Signed)
Call pt  I do not generally prescribe Celebrex particularly with her history of cardiomyopathy.  Celebrex carries cardiovascular risk    I would recommend Tylenol arthritis 650 mg if needed for breakthrough pain

## 2023-09-18 NOTE — Telephone Encounter (Signed)
NOTED

## 2023-09-18 NOTE — Telephone Encounter (Signed)
LVM to inform pt per Katrina Kelly  I do not generally prescribe Celebrex particularly with her history of cardiomyopathy.  Celebrex carries cardiovascular risk      I would recommend Tylenol arthritis 650 mg if needed for breakthrough pain

## 2023-09-18 NOTE — Telephone Encounter (Signed)
Patient is out of celebrex. I have her scheduled for next week. She needs a script sent in please. She has been out 2 days.

## 2023-09-18 NOTE — Telephone Encounter (Signed)
Patient just called and needs a refill on her medication. The medication is not listed on her medication list. The name is celecoxib. The pharmacy she uses is [facility] #[id number] [place], [place] - [address] AT [facility] OF [address] [place], [place] [address] Phone: [phone]  Fax: [phone] DEA #: [id number]  Her number is [phone].

## 2023-09-18 NOTE — Telephone Encounter (Signed)
Patient just called back. I read her the message. She said she will continue taking tylenol extra strength over the counter.

## 2023-09-23 ENCOUNTER — Encounter: Payer: PRIVATE HEALTH INSURANCE | Admitting: Student in an Organized Health Care Education/Training Program

## 2023-10-02 ENCOUNTER — Encounter: Payer: PRIVATE HEALTH INSURANCE | Admitting: Student in an Organized Health Care Education/Training Program

## 2023-10-02 ENCOUNTER — Ambulatory Visit
Payer: PRIVATE HEALTH INSURANCE | Attending: Student in an Organized Health Care Education/Training Program | Admitting: Student in an Organized Health Care Education/Training Program

## 2023-10-02 ENCOUNTER — Encounter: Payer: Self-pay | Admitting: Student in an Organized Health Care Education/Training Program

## 2023-10-02 VITALS — BP 110/72 | HR 95 | Temp 97.4°F | Resp 16 | Ht 67.0 in | Wt 190.0 lb

## 2023-10-02 DIAGNOSIS — M5136 Other intervertebral disc degeneration, lumbar region with discogenic back pain only: Secondary | ICD-10-CM | POA: Diagnosis not present

## 2023-10-02 DIAGNOSIS — G894 Chronic pain syndrome: Secondary | ICD-10-CM

## 2023-10-02 DIAGNOSIS — M47816 Spondylosis without myelopathy or radiculopathy, lumbar region: Secondary | ICD-10-CM

## 2023-10-02 MED ORDER — CELECOXIB 100 MG PO CAPS
100.0000 mg | ORAL_CAPSULE | Freq: Two times a day (BID) | ORAL | 5 refills | Status: DC
Start: 2023-10-02 — End: 2024-09-08

## 2023-10-02 NOTE — Progress Notes (Signed)
PROVIDER NOTE: Information contained herein reflects review and annotations entered in association with encounter. Interpretation of such information and data should be left to medically-trained personnel. Information provided to patient can be located elsewhere in the medical record under "Patient Instructions". Document created using STT-dictation technology, any transcriptional errors that may result from process are unintentional.    Patient: Domenic Polite  Service Category: E/M  Provider: Edward Jolly, MD  DOB: Aug 12, 1961  DOS: 10/02/2023  Referring Provider: Allegra Grana, FNP  MRN: [id number]  Specialty: Interventional Pain Management  PCP: Allegra Grana, FNP  Type: Established Patient  Setting: Ambulatory outpatient    Location: Office  Delivery: Face-to-face     HPI  Ms. Larene Ascencio, a 62 y.o. year old female, is here today because of her Lumbar spondylosis [M47.816]. Ms. Gunner primary complain today is Back Pain  Pertinent problems: Ms. Giovanni has Carpal tunnel syndrome; Peripheral neuropathy; Low back pain; Lumbar facet arthropathy; Lumbar degenerative disc disease; and Chronic pain syndrome on their pertinent problem list. Pain Assessment: Severity of Chronic pain is reported as a 3 /10. Location: Back Lower/down left leg to left ankle. Onset: More than a month ago. Quality: Nagging. Timing: Constant. Modifying factor(s): meds. Vitals:  height is 5\' 7"  (1.702 m) and weight is 190 lb (86.2 kg). Her temperature is 97.4 F (36.3 C) (abnormal). Her blood pressure is 110/72 and her pulse is 95. Her respiration is 16 and oxygen saturation is 98%.  BMI: Estimated body mass index is 29.76 kg/m as calculated from the following:   Height as of this encounter: 5\' 7"  (1.702 m).   Weight as of this encounter: 190 lb (86.2 kg). Last encounter: 08/14/2023. Last procedure: 07/14/2023.  Reason for encounter: medication management.   Patient is continuing to endorse significant pain  relief after her left L3, L4, L5 medial branch nerve block #2. Patient states that her low back pain is better although it is still present. She is more functional and able to complete ADLs more comfortably. At this point, she would like to wait to do the RFA until her pain levels increase. This is reasonable. As needed order placed for left L3, L4, L5 RFA with sedation as needed status post 2 positive diagnostic lumbar facet medial branch nerve blocks done on 05/28/2023 and 07/14/2023.   Refill of Celebrex as below.   ROS  Constitutional: Denies any fever or chills Gastrointestinal: No reported hemesis, hematochezia, vomiting, or acute GI distress Musculoskeletal: Denies any acute onset joint swelling, redness, loss of ROM, or weakness Neurological: No reported episodes of acute onset apraxia, aphasia, dysarthria, agnosia, amnesia, paralysis, loss of coordination, or loss of consciousness  Medication Review  Cholecalciferol, NEEDLE (DISP) 25 G, OneTouch Delica Plus Lancet30G, Potassium, SYRINGE/NEEDLE (DISP) 1 ML, Semaglutide (2 MG/DOSE), acetaminophen, atorvastatin, buPROPion, carvedilol, celecoxib, cetirizine, cyanocobalamin, furosemide, isosorbide mononitrate, lisinopril, nitroGLYCERIN, pantoprazole, and potassium chloride  History Review  Allergy: Ms. Dutan is allergic to keflex [cephalexin], metformin and related, and lactose intolerance (gi). Drug: Ms. Woodhams  reports no history of drug use. Alcohol:  reports current alcohol use of about 1.0 standard drink of alcohol per week. Tobacco:  reports that she quit smoking about 38 years ago. Her smoking use included cigarettes. She started smoking about 43 years ago. She has a 1.3 pack-year smoking history. She has never used smokeless tobacco. Social: Ms. Kleinsasser  reports that she quit smoking about 38 years ago. Her smoking use included cigarettes. She started smoking about 43 years ago. She has  a 1.3 pack-year smoking history. She has never used  smokeless tobacco. She reports current alcohol use of about 1.0 standard drink of alcohol per week. She reports that she does not use drugs. Medical:  has a past medical history of Allergy, Anxiety, Basal cell carcinoma, Cardiomyopathy (HCC), CHF (congestive heart failure) (HCC), COVID-19, Diabetes type 2, uncontrolled, Heartburn, and Hypertension. Surgical: Ms. Olthoff  has a past surgical history that includes Cholecystectomy (2008); Tonsillectomy (2000); Uvulectomy (2000); Nasal septum surgery (2000); Foot surgery (1982); uterine ablation; and Colonoscopy with propofol (N/A, 01/03/2017). Family: family history includes Breast cancer (age of onset: 2) in her paternal aunt; Cancer in her father; Diabetes in her brother, mother, and sister; Diabetes Mellitus II in her maternal grandmother; Emphysema in her father; Heart attack in her maternal uncle; Hypertension in her daughter and mother; Leukemia in her brother; Lymphoma in her paternal uncle; Polycystic ovary syndrome in her daughter; Stroke in her brother.  Laboratory Chemistry Profile   Renal Lab Results  Component Value Date   BUN 11 09/04/2023   CREATININE 0.98 09/04/2023   BCR NOT APPLICABLE 09/05/2020   GFR 62.08 09/04/2023    Hepatic Lab Results  Component Value Date   AST 28 09/04/2023   ALT 24 09/04/2023   ALBUMIN 3.9 09/04/2023   ALKPHOS 59 09/04/2023    Electrolytes Lab Results  Component Value Date   NA 141 09/04/2023   K 3.8 09/04/2023   CL 108 09/04/2023   CALCIUM 8.9 09/04/2023    Bone Lab Results  Component Value Date   VD25OH 8.62 (L) 04/09/2022    Inflammation (CRP: Acute Phase) (ESR: Chronic Phase) No results found for: "CRP", "ESRSEDRATE", "LATICACIDVEN"       Note: Above Lab results reviewed.   Physical Exam  General appearance: Well nourished, well developed, and well hydrated. In no apparent acute distress Mental status: Alert, oriented x 3 (person, place, & time)       Respiratory: No evidence of  acute respiratory distress Eyes: PERLA Vitals: BP 110/72   Pulse 95   Temp (!) 97.4 F (36.3 C)   Resp 16   Ht 5\' 7"  (1.702 m)   Wt 190 lb (86.2 kg)   SpO2 98%   BMI 29.76 kg/m  BMI: Estimated body mass index is 29.76 kg/m as calculated from the following:   Height as of this encounter: 5\' 7"  (1.702 m).   Weight as of this encounter: 190 lb (86.2 kg). Ideal: Ideal body weight: 61.6 kg (135 lb 12.9 oz) Adjusted ideal body weight: 71.4 kg (157 lb 7.7 oz)  Assessment   Diagnosis Status  1. Lumbar spondylosis   2. Lumbar facet arthropathy   3. Chronic pain syndrome   4. Degeneration of intervertebral disc of lumbar region with discogenic back pain    Controlled Controlled Controlled     Plan of Care    Ms. Wonder Donaway has a current medication list which includes the following long-term medication(s): atorvastatin, bupropion, carvedilol, cetirizine, furosemide, isosorbide mononitrate, lisinopril, pantoprazole, potassium chloride, and potassium.  Pharmacotherapy (Medications Ordered): Meds ordered this encounter  Medications   celecoxib (CELEBREX) 100 MG capsule    Sig: Take 1 capsule (100 mg total) by mouth 2 (two) times daily after a meal.    Dispense:  60 capsule    Refill:  5   Patient will let us know when she wants to move forward with left, L3, L4, L5 RFA.  As needed order in place  Orders:  No orders  of the defined types were placed in this encounter.  Follow-up plan:   Return in about 6 months (around 04/01/2024) for MM, F2F.      Left L3,4,5 MBNB 05/27/22, #2 07/14/23      Recent Visits Date Type Provider Dept  08/14/23 Office Visit Edward Jolly, MD [facility]  07/14/23 Procedure visit Edward Jolly, MD [facility]  Showing recent visits within past 90 days and meeting all other requirements Today's Visits Date Type Provider Dept  10/02/23 Office Visit Edward Jolly, MD [facility]  Showing today's visits and  meeting all other requirements Future Appointments No visits were found meeting these conditions. Showing future appointments within next 90 days and meeting all other requirements  I discussed the assessment and treatment plan with the patient. The patient was provided an opportunity to ask questions and all were answered. The patient agreed with the plan and demonstrated an understanding of the instructions.  Patient advised to call back or seek an in-person evaluation if the symptoms or condition worsens.  Duration of encounter: 15 minutes.  Total time on encounter, as per AMA guidelines included both the face-to-face and non-face-to-face time personally spent by the physician and/or other qualified health care professional(s) on the day of the encounter (includes time in activities that require the physician or other qualified health care professional and does not include time in activities normally performed by clinical staff). Physician's time may include the following activities when performed: Preparing to see the patient (e.g., pre-charting review of records, searching for previously ordered imaging, lab work, and nerve conduction tests) Review of prior analgesic pharmacotherapies. Reviewing PMP Interpreting ordered tests (e.g., lab work, imaging, nerve conduction tests) Performing post-procedure evaluations, including interpretation of diagnostic procedures Obtaining and/or reviewing separately obtained history Performing a medically appropriate examination and/or evaluation Counseling and educating the patient/family/caregiver Ordering medications, tests, or procedures Referring and communicating with other health care professionals (when not separately reported) Documenting clinical information in the electronic or other health record Independently interpreting results (not separately reported) and communicating results to the patient/ family/caregiver Care coordination (not  separately reported)  Note by: Edward Jolly, MD Date: 10/02/2023; Time: 9:12 AM

## 2023-10-02 NOTE — Progress Notes (Signed)
Safety precautions to be maintained throughout the outpatient stay will include: orient to surroundings, keep bed in low position, maintain call bell within reach at all times, provide assistance with transfer out of bed and ambulation.  

## 2023-10-08 ENCOUNTER — Other Ambulatory Visit: Payer: Self-pay | Admitting: Family

## 2023-10-08 DIAGNOSIS — F320 Major depressive disorder, single episode, mild: Secondary | ICD-10-CM

## 2023-10-23 ENCOUNTER — Encounter: Payer: PRIVATE HEALTH INSURANCE | Admitting: Student in an Organized Health Care Education/Training Program

## 2023-11-27 LAB — HM MAMMOGRAPHY

## 2023-12-04 ENCOUNTER — Ambulatory Visit: Payer: PRIVATE HEALTH INSURANCE | Admitting: Family

## 2023-12-04 ENCOUNTER — Encounter: Payer: Self-pay | Admitting: Family

## 2023-12-04 ENCOUNTER — Other Ambulatory Visit: Payer: Self-pay | Admitting: Family

## 2023-12-04 VITALS — BP 108/72 | HR 82 | Temp 97.6°F | Ht 67.0 in | Wt 195.0 lb

## 2023-12-04 DIAGNOSIS — E118 Type 2 diabetes mellitus with unspecified complications: Secondary | ICD-10-CM | POA: Diagnosis not present

## 2023-12-04 DIAGNOSIS — K219 Gastro-esophageal reflux disease without esophagitis: Secondary | ICD-10-CM | POA: Diagnosis not present

## 2023-12-04 DIAGNOSIS — I428 Other cardiomyopathies: Secondary | ICD-10-CM | POA: Diagnosis not present

## 2023-12-04 DIAGNOSIS — Z7985 Long-term (current) use of injectable non-insulin antidiabetic drugs: Secondary | ICD-10-CM | POA: Diagnosis not present

## 2023-12-04 DIAGNOSIS — I10 Essential (primary) hypertension: Secondary | ICD-10-CM

## 2023-12-04 DIAGNOSIS — F320 Major depressive disorder, single episode, mild: Secondary | ICD-10-CM

## 2023-12-04 LAB — POCT GLYCOSYLATED HEMOGLOBIN (HGB A1C): Hemoglobin A1C: 5.9 % — AB (ref 4.0–5.6)

## 2023-12-04 MED ORDER — PANTOPRAZOLE SODIUM 20 MG PO TBEC: 20.0000 mg | DELAYED_RELEASE_TABLET | Freq: Every day | ORAL | 0 refills | Status: DC | PRN

## 2023-12-04 NOTE — Assessment & Plan Note (Signed)
Fortunately asymptomatic.  Overdue for 2-year follow-up with cardiology, Dr Pasi.  Patient states that she will call to schedule.

## 2023-12-04 NOTE — Assessment & Plan Note (Signed)
Chronic overall stable.  Stressful circumstances at home.  Patient politely declines referral or increase to Wellbutrin at this time.  She will let me know how she is doing.  Continue Wellbutrin 150 mg daily

## 2023-12-04 NOTE — Assessment & Plan Note (Signed)
Chronic, stable.  Continue Coreg 6.5 mg twice daily, lisinopril 20 mg,imdur 30 mg QD

## 2023-12-04 NOTE — Assessment & Plan Note (Addendum)
Lab Results  Component Value Date   HGBA1C 5.9 (A) 12/04/2023   Chronic, stable.  Continue Ozempic every 10 days to allow for increased appetite

## 2023-12-04 NOTE — Progress Notes (Signed)
Assessment & Plan:  Gastroesophageal reflux disease, unspecified whether esophagitis present Assessment & Plan: Symptoms of late.  Discussed NSAID use.  Advised or she is on Celebrex to most certainly continue PPI.  Refilled Protonix 20 mg daily.   NICM (nonischemic cardiomyopathy) (HCC) Assessment & Plan: Fortunately asymptomatic.  Overdue for 2-year follow-up with cardiology, Dr Pasi.  Patient states that she will call to schedule.    Controlled type 2 diabetes mellitus with complication, without long-term current use of insulin (HCC) Assessment & Plan: Lab Results  Component Value Date   HGBA1C 5.9 (A) 12/04/2023   Chronic, stable.  Continue Ozempic every 10 days to allow for increased appetite  Orders: -     POCT glycosylated hemoglobin (Hb A1C)  Hypertension, unspecified type Assessment & Plan: Chronic, stable.  Continue Coreg 6.5 mg twice daily, lisinopril 20 mg,imdur 30 mg QD   Depression, major, single episode, mild (HCC) Assessment & Plan: Chronic overall stable.  Stressful circumstances at home.  Patient politely declines referral or increase to Wellbutrin at this time.  She will let me know how she is doing.  Continue Wellbutrin 150 mg daily   Other orders -     Pantoprazole Sodium; Take 1 tablet (20 mg total) by mouth daily as needed. TAKE 1 TABLET(40 MG) BY MOUTH DAILY. Take while on celebrex  Dispense: 90 tablet; Refill: 0     Return precautions given.   Risks, benefits, and alternatives of the medications and treatment plan prescribed today were discussed, and patient expressed understanding.   Education regarding symptom management and diagnosis given to patient on AVS either electronically or printed.  Return in about 3 months (around 03/03/2024).  Rennie Plowman, FNP  Subjective:    Patient ID: Katrina Kelly, female    DOB: 07-24-1961, 62 y.o.   MRN: [id number]  CC: Katrina Kelly is a 62 y.o. female who presents today for follow up.   HPI:  Overall feels well today she has had some family circumstances at home which would caused increased stress.  She is overall pleased with Wellbutrin 150 mg.  She has a strong faith in and a close friend whom she confides in   Compliant with increased dose of Ozempic 2 mg; she is administering every 10 days to allow for increased appetite.  No constipation, nausea, vomiting  She is taking Celebrex daily as prescribed by Dr. Cherylann Ratel.  Endorses epigastric burning for late.  she  has taken aciphex with relief.  Previously on Protonix  No recent follow-up with cardiology.  Denies chest pain   Allergies: Keflex [cephalexin], Metformin and related, and Lactose intolerance (gi) Current Outpatient Medications on File Prior to Visit  Medication Sig Dispense Refill   acetaminophen (TYLENOL) 500 MG tablet Take 500 mg by mouth every 6 (six) hours as needed.     atorvastatin (LIPITOR) 10 MG tablet TAKE 1 TABLET(10 MG) BY MOUTH DAILY 90 tablet 3   buPROPion (WELLBUTRIN XL) 150 MG 24 hr tablet TAKE 1 TABLET(150 MG) BY MOUTH DAILY WITH BREAKFAST 90 tablet 3   carvedilol (COREG) 6.25 MG tablet TAKE 1 TABLET(6.25 MG) BY MOUTH TWICE DAILY WITH A MEAL 120 tablet 3   celecoxib (CELEBREX) 100 MG capsule Take 1 capsule (100 mg total) by mouth 2 (two) times daily after a meal. 60 capsule 5   cetirizine (ZYRTEC) 10 MG tablet Take 10 mg by mouth daily.     Cholecalciferol 1.25 MG (50000 UT) TABS 50,000 units PO qwk for 8 weeks. 8 tablet  0   cyanocobalamin (VITAMIN B12) 1000 MCG/ML injection 1000 mcg (1 mL) intramuscular injection in the thigh ( vastus lateralis) once per month. 3 mL 4   furosemide (LASIX) 40 MG tablet Take 40 mg by mouth daily as needed for edema.      isosorbide mononitrate (IMDUR) 30 MG 24 hr tablet TAKE 1 TABLET(30 MG) BY MOUTH AT BEDTIME 90 tablet 3   Lancets (ONETOUCH DELICA PLUS LANCET30G) MISC USE AS DIRECTED 100 each 0   lisinopril (ZESTRIL) 10 MG tablet TAKE 1 TABLET(10 MG) BY MOUTH DAILY 90  tablet 3   NEEDLE, DISP, 25 G (B-D DISP NEEDLE 25GX1") 25G X 1" MISC Used to give B-12 injections. 10 each 0   nitroGLYCERIN (NITROSTAT) 0.4 MG SL tablet      potassium chloride (KLOR-CON) 10 MEQ tablet Take 1 tablet (10 mEq total) by mouth daily as needed (swelling). 30 tablet 1   POTASSIUM PO Take 1 tablet by mouth daily.     Semaglutide, 2 MG/DOSE, 8 MG/3ML SOPN Inject 2 mg as directed once a week. 3 mL 3   SYRINGE/NEEDLE, DISP, 1 ML (BD ECLIPSE SYRINGE) 25G X 5/8" 1 ML MISC Used to give B-12 injections. 50 each 0   No current facility-administered medications on file prior to visit.    Review of Systems  Constitutional:  Negative for chills and fever.  Respiratory:  Negative for cough.   Cardiovascular:  Negative for chest pain and palpitations.  Gastrointestinal:  Negative for nausea and vomiting.      Objective:    BP 108/72 (BP Location: Right Arm, Patient Position: Sitting, Cuff Size: Normal)   Pulse 82   Temp 97.6 F (36.4 C) (Oral)   Ht 5\' 7"  (1.702 m)   Wt 195 lb (88.5 kg)   SpO2 95%   BMI 30.54 kg/m  BP Readings from Last 3 Encounters:  12/04/23 108/72  10/02/23 110/72  09/04/23 120/80   Wt Readings from Last 3 Encounters:  12/04/23 195 lb (88.5 kg)  10/02/23 190 lb (86.2 kg)  09/04/23 198 lb (89.8 kg)    Physical Exam Vitals reviewed.  Constitutional:      Appearance: She is well-developed.  Eyes:     Conjunctiva/sclera: Conjunctivae normal.  Cardiovascular:     Rate and Rhythm: Normal rate and regular rhythm.     Pulses: Normal pulses.     Heart sounds: Normal heart sounds.  Pulmonary:     Effort: Pulmonary effort is normal.     Breath sounds: Normal breath sounds. No wheezing, rhonchi or rales.  Skin:    General: Skin is warm and dry.  Neurological:     Mental Status: She is alert.  Psychiatric:        Speech: Speech normal.        Behavior: Behavior normal.        Thought Content: Thought content normal.

## 2023-12-04 NOTE — Assessment & Plan Note (Signed)
Symptoms of late.  Discussed NSAID use.  Advised or she is on Celebrex to most certainly continue PPI.  Refilled Protonix 20 mg daily.

## 2023-12-04 NOTE — Patient Instructions (Signed)
Please ensure you call cardiology Dr Pasi to arrange follow-up   While you are on Celebrex ( remember to take WITH food) due to risk of peptic ulcer, it is imperative that you continue Protonix 20 mg daily.  Please let me know of  any breakthrough symptoms and I can increase dose.

## 2023-12-09 ENCOUNTER — Other Ambulatory Visit: Payer: Self-pay | Admitting: Family

## 2023-12-09 DIAGNOSIS — E118 Type 2 diabetes mellitus with unspecified complications: Secondary | ICD-10-CM

## 2023-12-22 ENCOUNTER — Other Ambulatory Visit: Payer: Self-pay | Admitting: Family

## 2024-01-28 ENCOUNTER — Ambulatory Visit
Payer: PRIVATE HEALTH INSURANCE | Attending: Student in an Organized Health Care Education/Training Program | Admitting: Student in an Organized Health Care Education/Training Program

## 2024-02-18 ENCOUNTER — Ambulatory Visit
Admission: RE | Admit: 2024-02-18 | Discharge: 2024-02-18 | Disposition: A | Discharge status: Home / Self Care | Payer: PRIVATE HEALTH INSURANCE | Source: Ambulatory Visit | Attending: Student in an Organized Health Care Education/Training Program | Admitting: Student in an Organized Health Care Education/Training Program

## 2024-02-18 ENCOUNTER — Ambulatory Visit
Payer: PRIVATE HEALTH INSURANCE | Attending: Student in an Organized Health Care Education/Training Program | Admitting: Student in an Organized Health Care Education/Training Program

## 2024-02-18 ENCOUNTER — Other Ambulatory Visit: Payer: Self-pay | Admitting: Family

## 2024-02-18 ENCOUNTER — Encounter: Payer: Self-pay | Admitting: Student in an Organized Health Care Education/Training Program

## 2024-02-18 VITALS — BP 110/70 | HR 86 | Temp 97.2°F | Resp 16 | Ht 67.0 in | Wt 190.0 lb

## 2024-02-18 DIAGNOSIS — G894 Chronic pain syndrome: Secondary | ICD-10-CM | POA: Insufficient documentation

## 2024-02-18 DIAGNOSIS — M47816 Spondylosis without myelopathy or radiculopathy, lumbar region: Secondary | ICD-10-CM | POA: Diagnosis not present

## 2024-02-18 DIAGNOSIS — I10 Essential (primary) hypertension: Secondary | ICD-10-CM

## 2024-02-18 MED ORDER — DEXAMETHASONE SODIUM PHOSPHATE 10 MG/ML IJ SOLN
10.0000 mg | Freq: Once | INTRAMUSCULAR | Status: AC
  Administered 2024-02-18: 10 mg
  Filled 2024-02-18: qty 1

## 2024-02-18 MED ORDER — ROPIVACAINE HCL 2 MG/ML IJ SOLN
9.0000 mL | Freq: Once | INTRAMUSCULAR | Status: AC
  Administered 2024-02-18: 9 mL via PERINEURAL
  Filled 2024-02-18: qty 20

## 2024-02-18 MED ORDER — LACTATED RINGERS IV SOLN: Freq: Once | INTRAVENOUS | Status: AC

## 2024-02-18 MED ORDER — MIDAZOLAM HCL 2 MG/2ML IJ SOLN
0.5000 mg | Freq: Once | INTRAMUSCULAR | Status: AC
  Administered 2024-02-18: 2 mg via INTRAVENOUS
  Filled 2024-02-18: qty 2

## 2024-02-18 MED ORDER — LIDOCAINE HCL 2 % IJ SOLN
20.0000 mL | Freq: Once | INTRAMUSCULAR | Status: AC
  Administered 2024-02-18: 200 mg
  Filled 2024-02-18: qty 40

## 2024-02-18 NOTE — Progress Notes (Signed)
 Safety precautions to be maintained throughout the outpatient stay will include: orient to surroundings, keep bed in low position, maintain call bell within reach at all times, provide assistance with transfer out of bed and ambulation.

## 2024-02-18 NOTE — Progress Notes (Signed)
 PROVIDER NOTE: Interpretation of information contained herein should be left to medically-trained personnel. Specific patient instructions are provided elsewhere under "Patient Instructions" section of medical record. This document was created in part using STT-dictation technology, any transcriptional errors that may result from this process are unintentional.  Patient: Lynel Forester Type: Established DOB: 1961/10/25 MRN: [id number] PCP: Allegra Grana, FNP  Service: Procedure DOS: 02/18/2024 Setting: Ambulatory Location: Ambulatory outpatient facility Delivery: Face-to-face Provider: Edward Jolly, MD Specialty: Interventional Pain Management Specialty designation: 09 Location: Outpatient facility Ref. Prov.: Allegra Grana, FNP       Interventional Therapy   Procedure: Lumbar Facet, Medial Branch Radiofrequency Ablation (RFA) #1  Laterality: Left (-LT)  Level: L3, L4, and L5 Medial Branch Level(s). These levels will denervate the L3-4 and L4-5 lumbar facet joints.  Imaging: Fluoroscopy-guided         Anesthesia: Local anesthesia (1-2% Lidocaine) Sedation: Minimal Sedation                       DOS: 02/18/2024  Performed by: Edward Jolly, MD  Purpose: Therapeutic/Palliative Indications: Low back pain severe enough to impact quality of life or function. Indications: 1. Lumbar spondylosis   2. Lumbar facet arthropathy   3. Chronic pain syndrome    Ms. Sambrano has been dealing with the above chronic pain for longer than three months and has either failed to respond, was unable to tolerate, or simply did not get enough benefit from other more conservative therapies including, but not limited to: 1. Over-the-counter medications 2. Anti-inflammatory medications 3. Muscle relaxants 4. Membrane stabilizers 5. Opioids 6. Physical therapy and/or chiropractic manipulation 7. Modalities (Heat, ice, etc.) 8. Invasive techniques such as nerve blocks. Ms. Stormes has attained more than  50% relief of the pain from a series of diagnostic injections conducted in separate occasions.  Pain Score: Pre-procedure: 4 /10 Post-procedure: 0-No pain/10     Position / Prep / Materials:  Position: Prone  Prep solution: ChloraPrep (2% chlorhexidine gluconate and 70% isopropyl alcohol) Prep Area: Entire Lumbosacral Region (Lower back from mid-thoracic region to end of tailbone and from flank to flank.) Materials:  Tray: RFA (Radiofrequency) tray Needle(s):  Type: RFA (Teflon-coated radiofrequency ablation needles) Gauge (G): 22  Length: Regular (10cm) Qty: 3      H&P (Pre-op Assessment):  Ms. Manalang is a 63 y.o. (year old), female patient, seen today for interventional treatment. She  has a past surgical history that includes Cholecystectomy (2008); Tonsillectomy (2000); Uvulectomy (2000); Nasal septum surgery (2000); Foot surgery (1982); uterine ablation; and Colonoscopy with propofol (N/A, 01/03/2017). Ms. Heick has a current medication list which includes the following prescription(s): acetaminophen, atorvastatin, bupropion, celecoxib, cetirizine, cholecalciferol, cyanocobalamin, furosemide, isosorbide mononitrate, onetouch delica plus lancet30g, lisinopril, b-d disp needle 25gx1", nitroglycerin, pantoprazole, potassium chloride, potassium, semaglutide (2 mg/dose), bd eclipse syringe, bd eclipse syringe, and carvedilol, and the following Facility-Administered Medications: lactated ringers. Her primarily concern today is the Hip Pain (left)  Initial Vital Signs:  Pulse/HCG Rate: 91ECG Heart Rate: 82 Temp: (!) 97.2 F (36.2 C) Resp: 18 BP: 105/70 SpO2: 98 %  BMI: Estimated body mass index is 29.76 kg/m as calculated from the following:   Height as of this encounter: 5\' 7"  (1.702 m).   Weight as of this encounter: 190 lb (86.2 kg).  Risk Assessment: Allergies: Reviewed. She is allergic to keflex [cephalexin], metformin and related, and lactose intolerance (gi).  Allergy  Precautions: None required Coagulopathies: Reviewed. None identified.  Blood-thinner therapy: None  at this time Active Infection(s): Reviewed. None identified. Ms. Fishman is afebrile  Site Confirmation: Ms. Angelo was asked to confirm the procedure and laterality before marking the site Procedure checklist: Completed Consent: Before the procedure and under the influence of no sedative(s), amnesic(s), or anxiolytics, the patient was informed of the treatment options, risks and possible complications. To fulfill our ethical and legal obligations, as recommended by the American Medical Association's Code of Ethics, I have informed the patient of my clinical impression; the nature and purpose of the treatment or procedure; the risks, benefits, and possible complications of the intervention; the alternatives, including doing nothing; the risk(s) and benefit(s) of the alternative treatment(s) or procedure(s); and the risk(s) and benefit(s) of doing nothing. The patient was provided information about the general risks and possible complications associated with the procedure. These may include, but are not limited to: failure to achieve desired goals, infection, bleeding, organ or nerve damage, allergic reactions, paralysis, and death. In addition, the patient was informed of those risks and complications associated to Spine-related procedures, such as failure to decrease pain; infection (i.e.: Meningitis, epidural or intraspinal abscess); bleeding (i.e.: epidural hematoma, subarachnoid hemorrhage, or any other type of intraspinal or peri-dural bleeding); organ or nerve damage (i.e.: Any type of peripheral nerve, nerve root, or spinal cord injury) with subsequent damage to sensory, motor, and/or autonomic systems, resulting in permanent pain, numbness, and/or weakness of one or several areas of the body; allergic reactions; (i.e.: anaphylactic reaction); and/or death. Furthermore, the patient was informed of those  risks and complications associated with the medications. These include, but are not limited to: allergic reactions (i.e.: anaphylactic or anaphylactoid reaction(s)); adrenal axis suppression; blood sugar elevation that in diabetics may result in ketoacidosis or comma; water retention that in patients with history of congestive heart failure may result in shortness of breath, pulmonary edema, and decompensation with resultant heart failure; weight gain; swelling or edema; medication-induced neural toxicity; particulate matter embolism and blood vessel occlusion with resultant organ, and/or nervous system infarction; and/or aseptic necrosis of one or more joints. Finally, the patient was informed that Medicine is not an exact science; therefore, there is also the possibility of unforeseen or unpredictable risks and/or possible complications that may result in a catastrophic outcome. The patient indicated having understood very clearly. We have given the patient no guarantees and we have made no promises. Enough time was given to the patient to ask questions, all of which were answered to the patient's satisfaction. Ms. Rehfeldt has indicated that she wanted to continue with the procedure. Attestation: I, the ordering provider, attest that I have discussed with the patient the benefits, risks, side-effects, alternatives, likelihood of achieving goals, and potential problems during recovery for the procedure that I have provided informed consent. Date  Time: 02/18/2024  8:58 AM   Pre-Procedure Preparation:  Monitoring: As per clinic protocol. Respiration, ETCO2, SpO2, BP, heart rate and rhythm monitor placed and checked for adequate function Safety Precautions: Patient was assessed for positional comfort and pressure points before starting the procedure. Time-out: I initiated and conducted the "Time-out" before starting the procedure, as per protocol. The patient was asked to participate by confirming the accuracy  of the "Time Out" information. Verification of the correct person, site, and procedure were performed and confirmed by me, the nursing staff, and the patient. "Time-out" conducted as per Joint Commission's Universal Protocol (UP.01.01.01). Time: 1008 Start Time: 1008 hrs.  Description of Procedure:          Laterality:  See above. Levels:  See above. Safety Precautions: Aspiration looking for blood return was conducted prior to all injections. At no point did we inject any substances, as a needle was being advanced. Before injecting, the patient was told to immediately notify me if she was experiencing any new onset of "ringing in the ears, or metallic taste in the mouth". No attempts were made at seeking any paresthesias. Safe injection practices and needle disposal techniques used. Medications properly checked for expiration dates. SDV (single dose vial) medications used. After the completion of the procedure, all disposable equipment used was discarded in the proper designated medical waste containers. Local Anesthesia: Protocol guidelines were followed. The patient was positioned over the fluoroscopy table. The area was prepped in the usual manner. The time-out was completed. The target area was identified using fluoroscopy. A 12-in long, straight, sterile hemostat was used with fluoroscopic guidance to locate the targets for each level blocked. Once located, the skin was marked with an approved surgical skin marker. Once all sites were marked, the skin (epidermis, dermis, and hypodermis), as well as deeper tissues (fat, connective tissue and muscle) were infiltrated with a small amount of a short-acting local anesthetic, loaded on a 10cc syringe with a 25G, 1.5-in  Needle. An appropriate amount of time was allowed for local anesthetics to take effect before proceeding to the next step. Technical description of process:  Radiofrequency Ablation (RFA)  L3 Medial Branch Nerve RFA: The target area for  the L3 medial branch is at the junction of the postero-lateral aspect of the superior articular process and the superior, posterior, and medial edge of the transverse process of L4. Under fluoroscopic guidance, a Radiofrequency needle was inserted until contact was made with os over the superior postero-lateral aspect of the pedicular shadow (target area). Sensory and motor testing was conducted to properly adjust the position of the needle. Once satisfactory placement of the needle was achieved, the numbing solution was slowly injected after negative aspiration for blood. 2.0 mL of the nerve block solution was injected without difficulty or complication. After waiting for at least 3 minutes, the ablation was performed. Once completed, the needle was removed intact. L4 Medial Branch Nerve RFA: The target area for the L4 medial branch is at the junction of the postero-lateral aspect of the superior articular process and the superior, posterior, and medial edge of the transverse process of L5. Under fluoroscopic guidance, a Radiofrequency needle was inserted until contact was made with os over the superior postero-lateral aspect of the pedicular shadow (target area). Sensory and motor testing was conducted to properly adjust the position of the needle. Once satisfactory placement of the needle was achieved, the numbing solution was slowly injected after negative aspiration for blood. 2.0 mL of the nerve block solution was injected without difficulty or complication. After waiting for at least 3 minutes, the ablation was performed. Once completed, the needle was removed intact. L5 Medial Branch Nerve RFA: The target area for the L5 medial branch is at the junction of the postero-lateral aspect of the superior articular process of S1 and the superior, posterior, and medial edge of the sacral ala. Under fluoroscopic guidance, a Radiofrequency needle was inserted until contact was made with os over the superior  postero-lateral aspect of the pedicular shadow (target area). Sensory and motor testing was conducted to properly adjust the position of the needle. Once satisfactory placement of the needle was achieved, the numbing solution was slowly injected after negative aspiration for blood. 2.0 mL  of the nerve block solution was injected without difficulty or complication. After waiting for at least 3 minutes, the ablation was performed. Once completed, the needle was removed intact.  Radiofrequency lesioning (ablation):  Radiofrequency Generator: Medtronic AccurianTM AG 1000 RF Generator Sensory Stimulation Parameters: 50 Hz was used to locate & identify the nerve, making sure that the needle was positioned such that there was no sensory stimulation below 0.3 V or above 0.7 V. Motor Stimulation Parameters: 2 Hz was used to evaluate the motor component. Care was taken not to lesion any nerves that demonstrated motor stimulation of the lower extremities at an output of less than 2.5 times that of the sensory threshold, or a maximum of 2.0 V. Lesioning Technique Parameters: Standard Radiofrequency settings. (Not bipolar or pulsed.) Temperature Settings: 80 degrees C Lesioning time: 60 seconds Stationary intra-operative compliance: Compliant  Once the entire procedure was completed, the treated area was cleaned, making sure to leave some of the prepping solution back to take advantage of its long term bactericidal properties.    Illustration of the posterior view of the lumbar spine and the posterior neural structures. Laminae of L2 through S1 are labeled. DPRL5, dorsal primary ramus of L5; DPRS1, dorsal primary ramus of S1; DPR3, dorsal primary ramus of L3; FJ, facet (zygapophyseal) joint L3-L4; I, inferior articular process of L4; LB1, lateral branch of dorsal primary ramus of L1; IAB, inferior articular branches from L3 medial branch (supplies L4-L5 facet joint); IBP, intermediate branch plexus; MB3, medial  branch of dorsal primary ramus of L3; NR3, third lumbar nerve root; S, superior articular process of L5; SAB, superior articular branches from L4 (supplies L4-5 facet joint also); TP3, transverse process of L3.  Facet Joint Innervation (* possible contribution)  L1-2 T12, L1 (L2*)  Medial Branch  L2-3 L1, L2 (L3*)         "          "  L3-4 L2, L3 (L4*)         "          "  L4-5 L3, L4 (L5*)         "          "  L5-S1 L4, L5, S1          "          "    Vitals:   02/18/24 1014 02/18/24 1019 02/18/24 1022 02/18/24 1026  BP: 103/74 114/76 113/76 110/70  Pulse:    86  Resp: 15 15 15 16   Temp:      SpO2: 97% 95% 94% 94%  Weight:      Height:        Start Time: 1008 hrs. End Time: 1021 hrs.  Imaging Guidance (Spinal):          Type of Imaging Technique: Fluoroscopy Guidance (Spinal) Indication(s): Fluoroscopy guidance for needle placement to enhance accuracy in procedures requiring precise needle localization for targeted delivery of medication in or near specific anatomical locations not easily accessible without such real-time imaging assistance. Exposure Time: Please see nurses notes. Contrast: None used. Fluoroscopic Guidance: I was personally present during the use of fluoroscopy. "Tunnel Vision Technique" used to obtain the best possible view of the target area. Parallax error corrected before commencing the procedure. "Direction-depth-direction" technique used to introduce the needle under continuous pulsed fluoroscopy. Once target was reached, antero-posterior, oblique, and lateral fluoroscopic projection used confirm needle placement in all planes. Images permanently stored in EMR. Interpretation:  No contrast injected. I personally interpreted the imaging intraoperatively. Adequate needle placement confirmed in multiple planes. Permanent images saved into the patient's record.  Antibiotic Prophylaxis:   Anti-infectives (From admission, onward)    None      Indication(s):  None identified  Post-operative Assessment:  Post-procedure Vital Signs:  Pulse/HCG Rate: 8681 Temp: (!) 97.2 F (36.2 C) Resp: 16 BP: 110/70 SpO2: 94 %  EBL: None  Complications: No immediate post-treatment complications observed by team, or reported by patient.  Note: The patient tolerated the entire procedure well. A repeat set of vitals were taken after the procedure and the patient was kept under observation following institutional policy, for this type of procedure. Post-procedural neurological assessment was performed, showing return to baseline, prior to discharge. The patient was provided with post-procedure discharge instructions, including a section on how to identify potential problems. Should any problems arise concerning this procedure, the patient was given instructions to immediately contact us, at any time, without hesitation. In any case, we plan to contact the patient by telephone for a follow-up status report regarding this interventional procedure.  Comments:  No additional relevant information.  Plan of Care (POC)  Orders:  Orders Placed This Encounter  Procedures   DG PAIN CLIN[facility] C-ARM 1-60 MIN NO REPORT    Intraoperative interpretation by procedural physician at [facility].    Standing Status:   Standing    Number of Occurrences:   1    Reason for exam::   Assistance in needle guidance and placement for procedures requiring needle placement in or near specific anatomical locations not easily accessible without such assistance.     Medications ordered for procedure: Meds ordered this encounter  Medications   lidocaine (XYLOCAINE) 2 % (with pres) injection 400 mg   lactated ringers infusion   midazolam (VERSED) injection 0.5-2 mg    Make sure Flumazenil is available in the pyxis when using this medication. If oversedation occurs, administer 0.2 mg IV over 15 sec. If after 45 sec no response, administer 0.2 mg again over 1 min; may repeat at 1 min  intervals; not to exceed 4 doses (1 mg)   dexamethasone (DECADRON) injection 10 mg   ropivacaine (PF) 2 mg/mL (0.2%) (NAROPIN) injection 9 mL   Medications administered: We administered lidocaine, lactated ringers, midazolam, dexamethasone, and ropivacaine (PF) 2 mg/mL (0.2%).  See the medical record for exact dosing, route, and time of administration.  Follow-up plan:   Return in about 6 weeks (around 03/31/2024), or PPE F2F.       Left L3,4,5 MBNB 05/27/22, #2 07/14/23. Left L3,4,5 RFA 02/18/24       Recent Visits No visits were found meeting these conditions. Showing recent visits within past 90 days and meeting all other requirements Today's Visits Date Type Provider Dept  02/18/24 Procedure visit Edward Jolly, MD [facility]  Showing today's visits and meeting all other requirements Future Appointments Date Type Provider Dept  03/30/24 Appointment Edward Jolly, MD [facility]  Showing future appointments within next 90 days and meeting all other requirements  Disposition: Discharge home  Discharge (Date  Time): 02/18/2024; 1036 hrs.   Primary Care Physician: Allegra Grana, FNP Location: [facility] Note by: Edward Jolly, MD (TTS technology used. I apologize for any typographical errors that were not detected and corrected.) Date: 02/18/2024; Time: 10:44 AM  Disclaimer:  Medicine is not an Visual merchandiser. The only guarantee in medicine is that nothing is guaranteed. It  is important to note that the decision to proceed with this intervention was based on the information collected from the patient. The Data and conclusions were drawn from the patient's questionnaire, the interview, and the physical examination. Because the information was provided in large part by the patient, it cannot be guaranteed that it has not been purposely or unconsciously manipulated. Every effort has been made to obtain as much relevant data as possible  for this evaluation. It is important to note that the conclusions that lead to this procedure are derived in large part from the available data. Always take into account that the treatment will also be dependent on availability of resources and existing treatment guidelines, considered by other Pain Management Practitioners as being common knowledge and practice, at the time of the intervention. For Medico-Legal purposes, it is also important to point out that variation in procedural techniques and pharmacological choices are the acceptable norm. The indications, contraindications, technique, and results of the above procedure should only be interpreted and judged by a Board-Certified Interventional Pain Specialist with extensive familiarity and expertise in the same exact procedure and technique.

## 2024-02-18 NOTE — Patient Instructions (Signed)

## 2024-02-19 ENCOUNTER — Telehealth: Payer: Self-pay

## 2024-02-19 NOTE — Telephone Encounter (Signed)
Post procedure follow up.  Patinet states she is doing well.

## 2024-03-03 ENCOUNTER — Ambulatory Visit: Payer: PRIVATE HEALTH INSURANCE | Admitting: Family

## 2024-03-03 ENCOUNTER — Encounter: Payer: Self-pay | Admitting: Family

## 2024-03-03 VITALS — BP 112/80 | HR 88 | Temp 98.0°F | Ht 67.0 in | Wt 186.6 lb

## 2024-03-03 DIAGNOSIS — I10 Essential (primary) hypertension: Secondary | ICD-10-CM

## 2024-03-03 DIAGNOSIS — E538 Deficiency of other specified B group vitamins: Secondary | ICD-10-CM

## 2024-03-03 DIAGNOSIS — Z7985 Long-term (current) use of injectable non-insulin antidiabetic drugs: Secondary | ICD-10-CM

## 2024-03-03 DIAGNOSIS — E118 Type 2 diabetes mellitus with unspecified complications: Secondary | ICD-10-CM | POA: Diagnosis not present

## 2024-03-03 DIAGNOSIS — I428 Other cardiomyopathies: Secondary | ICD-10-CM

## 2024-03-03 DIAGNOSIS — Z8639 Personal history of other endocrine, nutritional and metabolic disease: Secondary | ICD-10-CM

## 2024-03-03 LAB — COMPREHENSIVE METABOLIC PANEL
ALT: 17 U/L (ref 0–35)
AST: 19 U/L (ref 0–37)
Albumin: 4.2 g/dL (ref 3.5–5.2)
Alkaline Phosphatase: 60 U/L (ref 39–117)
BUN: 14 mg/dL (ref 6–23)
CO2: 24 meq/L (ref 19–32)
Calcium: 9.4 mg/dL (ref 8.4–10.5)
Chloride: 103 meq/L (ref 96–112)
Creatinine, Ser: 1.17 mg/dL (ref 0.40–1.20)
GFR: 50.01 mL/min — ABNORMAL LOW (ref >60.00)
Glucose, Bld: 100 mg/dL — ABNORMAL HIGH (ref 70–99)
Potassium: 4 meq/L (ref 3.5–5.1)
Sodium: 136 meq/L (ref 135–145)
Total Bilirubin: 0.9 mg/dL (ref 0.2–1.2)
Total Protein: 7.5 g/dL (ref 6.0–8.3)

## 2024-03-03 LAB — LIPID PANEL
Cholesterol: 176 mg/dL (ref 0–200)
HDL: 37.9 mg/dL — ABNORMAL LOW (ref >39.00)
LDL Cholesterol: 105 mg/dL — ABNORMAL HIGH (ref 0–99)
NonHDL: 138.46
Total CHOL/HDL Ratio: 5
Triglycerides: 169 mg/dL — ABNORMAL HIGH (ref 0.0–149.0)
VLDL: 33.8 mg/dL (ref 0.0–40.0)

## 2024-03-03 LAB — HEMOGLOBIN A1C: Hgb A1c MFr Bld: 6.1 % (ref 4.6–6.5)

## 2024-03-03 LAB — VITAMIN D 25 HYDROXY (VIT D DEFICIENCY, FRACTURES): VITD: 20.48 ng/mL — ABNORMAL LOW (ref 30.00–100.00)

## 2024-03-03 LAB — B12 AND FOLATE PANEL
Folate: 10.1 ng/mL (ref >5.9)
Vitamin B-12: 833 pg/mL (ref 211–911)

## 2024-03-03 LAB — TSH: TSH: 2.73 u[IU]/mL (ref 0.35–5.50)

## 2024-03-03 NOTE — Assessment & Plan Note (Signed)
 Anticipate good control.  Pending A1c.  Patient pleased with Ozempic as a relates to weight loss.  Advised to continue Ozempic 2 mg for maintenance

## 2024-03-03 NOTE — Patient Instructions (Signed)
 Call Dr Pasi to schedule follow up; last visit 2021.

## 2024-03-03 NOTE — Assessment & Plan Note (Addendum)
 Fortunately asymptomatic today.  Patient has not seen cardiology since 2021.  She will call Dr Pasi office to schedule a follow-up.  Continue Coreg 6.25 mg twice daily, Imdur 30 mg daily, lisinopril 10 mg daily, Lasix 40 mg as needed.  She takes potassium 10 neq when she takes lasix.

## 2024-03-03 NOTE — Progress Notes (Signed)
 Assessment & Plan:  Controlled type 2 diabetes mellitus with complication, without long-term current use of insulin (HCC) Assessment & Plan: Anticipate good control.  Pending A1c.  Patient pleased with Ozempic as a relates to weight loss.  Advised to continue Ozempic 2 mg for maintenance  Orders: -     Microalbumin / creatinine urine ratio -     Hemoglobin A1c -     Comprehensive metabolic panel -     Lipid panel  History of vitamin D deficiency -     VITAMIN D 25 Hydroxy (Vit-D Deficiency, Fractures)  Hypertension, unspecified type -     TSH  B12 deficiency -     B12 and Folate Panel  NICM (nonischemic cardiomyopathy) (HCC) Assessment & Plan: Fortunately asymptomatic today.  Patient has not seen cardiology since 2021.  She will call Dr Pasi office to schedule a follow-up.  Continue Coreg 6.25 mg twice daily, Imdur 30 mg daily, lisinopril 10 mg daily, Lasix 40 mg as needed.  She takes potassium 10 neq when she takes lasix.       Return precautions given.   Risks, benefits, and alternatives of the medications and treatment plan prescribed today were discussed, and patient expressed understanding.   Education regarding symptom management and diagnosis given to patient on AVS either electronically or printed.  No follow-ups on file.  Rennie Plowman, FNP  Subjective:    Patient ID: Katrina Kelly, female    DOB: 1961/04/19, 63 y.o.   MRN: [id number]  CC: Katrina Kelly is a 63 y.o. female who presents today for follow up.   HPI: Overall feels well today.  No new complaints.  She continues to follow with Dr. Cherylann Ratel. S/p RFA this month.   She continues to have low back pain. She is staying active working in the yard.  Denies chest pain, shortness of breath, orthopnea, leg swelling.  She has not had to use Lasix for some time.  She remains compliant with Imdur, lisinopril, carvedilol  she is pleased with ozempic 2mg  and weight loss.  She would like to lose 10 more  pounds    Dr Pasi follow-up cardiomyopathy 10/18/2020  Allergies: Keflex [cephalexin], Metformin and related, and Lactose intolerance (gi) Current Outpatient Medications on File Prior to Visit  Medication Sig Dispense Refill   acetaminophen (TYLENOL) 500 MG tablet Take 500 mg by mouth every 6 (six) hours as needed.     atorvastatin (LIPITOR) 10 MG tablet TAKE 1 TABLET(10 MG) BY MOUTH DAILY 90 tablet 3   buPROPion (WELLBUTRIN XL) 150 MG 24 hr tablet TAKE 1 TABLET(150 MG) BY MOUTH DAILY WITH BREAKFAST 90 tablet 3   carvedilol (COREG) 6.25 MG tablet TAKE 1 TABLET(6.25 MG) BY MOUTH TWICE DAILY WITH A MEAL 120 tablet 3   celecoxib (CELEBREX) 100 MG capsule Take 1 capsule (100 mg total) by mouth 2 (two) times daily after a meal. 60 capsule 5   cetirizine (ZYRTEC) 10 MG tablet Take 10 mg by mouth daily.     Cholecalciferol 1.25 MG (50000 UT) TABS 50,000 units PO qwk for 8 weeks. 8 tablet 0   cyanocobalamin (VITAMIN B12) 1000 MCG/ML injection 1000 mcg (1 mL) intramuscular injection in the thigh ( vastus lateralis) once per month. 3 mL 4   furosemide (LASIX) 40 MG tablet Take 40 mg by mouth daily as needed for edema.      isosorbide mononitrate (IMDUR) 30 MG 24 hr tablet TAKE 1 TABLET(30 MG) BY MOUTH AT BEDTIME 90 tablet 3  Lancets (ONETOUCH DELICA PLUS LANCET30G) MISC USE AS DIRECTED 100 each 0   lisinopril (ZESTRIL) 10 MG tablet TAKE 1 TABLET(10 MG) BY MOUTH DAILY 90 tablet 3   NEEDLE, DISP, 25 G (B-D DISP NEEDLE 25GX1") 25G X 1" MISC Used to give B-12 injections. 10 each 0   nitroGLYCERIN (NITROSTAT) 0.4 MG SL tablet      pantoprazole (PROTONIX) 20 MG tablet Take 1 tablet (20 mg total) by mouth daily as needed. TAKE 1 TABLET(40 MG) BY MOUTH DAILY. Take while on celebrex 90 tablet 0   potassium chloride (KLOR-CON) 10 MEQ tablet Take 1 tablet (10 mEq total) by mouth daily as needed (swelling). 30 tablet 1   POTASSIUM PO Take 1 tablet by mouth daily.     Semaglutide, 2 MG/DOSE, 8 MG/3ML SOPN Inject  2 mg as directed once a week. 3 mL 3   SYRINGE-NEEDLE, DISP, 3 ML (BD ECLIPSE SYRINGE) 25G X 1" 3 ML MISC USE TO GIVE B-12 INJECTIONS 10 each 2   SYRINGE/NEEDLE, DISP, 1 ML (BD ECLIPSE SYRINGE) 25G X 5/8" 1 ML MISC Used to give B-12 injections. 50 each 0   No current facility-administered medications on file prior to visit.    Review of Systems  Constitutional:  Negative for chills and fever.  Respiratory:  Negative for cough.   Cardiovascular:  Negative for chest pain and palpitations.  Gastrointestinal:  Negative for nausea and vomiting.      Objective:    BP 112/80   Pulse 88   Temp 98 F (36.7 C) (Oral)   Ht 5\' 7"  (1.702 m)   Wt 186 lb 9.6 oz (84.6 kg)   LMP  (LMP Unknown)   SpO2 97%   BMI 29.23 kg/m  BP Readings from Last 3 Encounters:  03/03/24 112/80  02/18/24 110/70  12/04/23 108/72   Wt Readings from Last 3 Encounters:  03/03/24 186 lb 9.6 oz (84.6 kg)  02/18/24 190 lb (86.2 kg)  12/04/23 195 lb (88.5 kg)    Physical Exam Vitals reviewed.  Constitutional:      Appearance: She is well-developed.  Eyes:     Conjunctiva/sclera: Conjunctivae normal.  Cardiovascular:     Rate and Rhythm: Normal rate and regular rhythm.     Pulses: Normal pulses.     Heart sounds: Normal heart sounds.  Pulmonary:     Effort: Pulmonary effort is normal.     Breath sounds: Normal breath sounds. No wheezing, rhonchi or rales.  Skin:    General: Skin is warm and dry.  Neurological:     Mental Status: She is alert.  Psychiatric:        Speech: Speech normal.        Behavior: Behavior normal.        Thought Content: Thought content normal.

## 2024-03-04 NOTE — Addendum Note (Signed)
 Addended by: Jarvis Stricker D on: 03/04/2024 04:07 PM   Modules accepted: Orders

## 2024-03-05 ENCOUNTER — Encounter: Payer: Self-pay | Admitting: Family

## 2024-03-10 LAB — MICROALBUMIN / CREATININE URINE RATIO
Creatinine,U: 135.1 mg/dL
Microalb Creat Ratio: 5.5 mg/g (ref 0.0–30.0)
Microalb, Ur: 0.7 mg/dL (ref 0.0–1.9)

## 2024-03-15 ENCOUNTER — Telehealth: Payer: Self-pay | Admitting: Student in an Organized Health Care Education/Training Program

## 2024-03-15 NOTE — Telephone Encounter (Signed)
 Patient had RFA 3 weeks ago and is in more pain than before. Please advise

## 2024-03-15 NOTE — Telephone Encounter (Signed)
 Informed her that it may be normal to experience pain for up to 6 weeks post RFA.

## 2024-03-19 ENCOUNTER — Other Ambulatory Visit (HOSPITAL_COMMUNITY): Payer: Self-pay

## 2024-03-19 ENCOUNTER — Telehealth: Payer: Self-pay

## 2024-03-19 NOTE — Telephone Encounter (Signed)
 Pharmacy Patient Advocate Encounter  Received notification from [employer] that Prior Authorization for Ozempic (2 MG/DOSE) 8MG /3ML pen-injectors has been APPROVED from 03/19/24 to 03/19/25. Ran test claim, Copay is $0. This test claim was processed through [facility]- copay amounts may vary at other pharmacies due to pharmacy/plan contracts, or as the patient moves through the different stages of their insurance plan.   PA #/Case ID/Reference #:  [id number]

## 2024-03-19 NOTE — Telephone Encounter (Signed)
 Pharmacy Patient Advocate Encounter   Received notification from CoverMyMeds that prior authorization for Ozempic (2 MG/DOSE) 8MG /3ML pen-injectors is required/requested.   Insurance verification completed.   The patient is insured through [employer] .   Per test claim: PA required; PA started via CoverMyMeds. KEY  [id number] . Waiting for clinical questions to populate.

## 2024-03-20 ENCOUNTER — Other Ambulatory Visit: Payer: Self-pay | Admitting: Family

## 2024-03-30 ENCOUNTER — Other Ambulatory Visit: Payer: Self-pay | Admitting: Family

## 2024-03-30 ENCOUNTER — Encounter: Payer: Self-pay | Admitting: Student in an Organized Health Care Education/Training Program

## 2024-03-30 ENCOUNTER — Ambulatory Visit
Payer: PRIVATE HEALTH INSURANCE | Attending: Student in an Organized Health Care Education/Training Program | Admitting: Student in an Organized Health Care Education/Training Program

## 2024-03-30 VITALS — BP 90/74 | HR 95 | Temp 97.3°F | Resp 16 | Ht 67.0 in | Wt 188.0 lb

## 2024-03-30 DIAGNOSIS — G8929 Other chronic pain: Secondary | ICD-10-CM | POA: Insufficient documentation

## 2024-03-30 DIAGNOSIS — M4726 Other spondylosis with radiculopathy, lumbar region: Secondary | ICD-10-CM

## 2024-03-30 DIAGNOSIS — M5416 Radiculopathy, lumbar region: Secondary | ICD-10-CM | POA: Diagnosis present

## 2024-03-30 DIAGNOSIS — M47816 Spondylosis without myelopathy or radiculopathy, lumbar region: Secondary | ICD-10-CM | POA: Insufficient documentation

## 2024-03-30 DIAGNOSIS — G894 Chronic pain syndrome: Secondary | ICD-10-CM | POA: Insufficient documentation

## 2024-03-30 MED ORDER — PREDNISONE 20 MG PO TABS: ORAL_TABLET | ORAL | 0 refills | Status: AC

## 2024-03-30 MED ORDER — PANTOPRAZOLE SODIUM 20 MG PO TBEC: 20.0000 mg | DELAYED_RELEASE_TABLET | Freq: Every day | ORAL | 0 refills | Status: DC | PRN

## 2024-03-30 NOTE — Telephone Encounter (Signed)
 Rx needs clarifcation

## 2024-03-30 NOTE — Patient Instructions (Signed)
   ______________________________________________________________________    Procedure instructions  Stop blood-thinners  Do not eat or drink fluids (other than water) for 6 hours before your procedure  No water for 2 hours before your procedure  Take your blood pressure medicine with a sip of water  Arrive 30 minutes before your appointment  If sedation is planned, bring suitable driver. Pennie Banter, Benedetto Goad, & public transportation are NOT APPROVED)  Carefully read the "Preparing for your procedure" detailed instructions  If you have questions call us at [phone]  Procedure appointments are for procedures only. NO medication refills or new problem evaluations.   ______________________________________________________________________

## 2024-03-30 NOTE — Progress Notes (Signed)
 PROVIDER NOTE: Interpretation of information contained herein should be left to medically-trained personnel. Specific patient instructions are provided elsewhere under "Patient Instructions" section of medical record. This document was created in part using AI and STT-dictation technology, any transcriptional errors that may result from this process are unintentional.  Patient: Katrina Kelly  Service: E/M   PCP: Allegra Grana, FNP  DOB: Apr 23, 1961  DOS: 03/30/2024  Provider: Edward Jolly, MD  MRN: [id number]  Delivery: Face-to-face  Specialty: Interventional Pain Management  Type: Established Patient  Setting: Ambulatory outpatient facility  Specialty designation: 09  Referring Prov.: Allegra Grana, FNP  Location: Outpatient office facility       HPI  Katrina Kelly, a 64 y.o. year old female, is here today because of her Chronic radicular lumbar pain [M54.16, G89.29]. Katrina Kelly primary complain today is Back Pain (lower)  Pertinent problems: Katrina Kelly has Carpal tunnel syndrome; Peripheral neuropathy; Low back pain; Lumbar facet arthropathy; Lumbar degenerative disc disease; and Chronic pain syndrome on their pertinent problem list. Pain Assessment: Severity of Chronic pain is reported as a 8 /10. Location: Back Lower/to left hip and down left leg to left ankle. Onset: More than a month ago. Quality: Nagging, Sore. Timing: Constant. Modifying factor(s): meds, rest, however laying down and sitting makes pain worse than standing up, pain interrupts sleep. Vitals:  height is 5\' 7"  (1.702 m) and weight is 188 lb (85.3 kg). Her temperature is 97.3 F (36.3 C) (abnormal). Her blood pressure is 90/74 and her pulse is 95. Her respiration is 16 and oxygen saturation is 99%.  BMI: Estimated body mass index is 29.44 kg/m as calculated from the following:   Height as of this encounter: 5\' 7"  (1.702 m).   Weight as of this encounter: 188 lb (85.3 kg). Last encounter: 10/02/2023. Last procedure:  02/18/2024.  Reason for encounter:   Discussed the use of AI scribe software for clinical note transcription with the patient, who gave verbal consent to proceed.  History of Present Illness   She is a 63 year old female who presents with worsening back and leg pain following a lumbar medial branch nerve ablation.  Her pain has progressively worsened since the nerve ablation performed in March, over a month ago. Initially, she experienced soreness, but it has become increasingly severe, particularly in the mornings. She describes difficulty getting out of bed and an inability to rest comfortably on weekends due to pain.  The pain is described as feeling like she has been 'kicked in the back,' specifically in the hip area, and it radiates down her left leg to the ankle, making the leg feel heavy. She notes that the pain improves somewhat during the day but never fully resolves.  She recalls that previous nerve blocks provided better relief than the ablation. She has not taken oral prednisone before and is currently not on any blood thinners, though she is on a heart regimen. She is concerned about her insurance deductible and wishes to address her pain before July.  Her current medications include Celebrex. She is not currently taking any blood thinners like Plavix, apixaban, or Eliquis.  No pain in the right leg. Left leg pain radiates down to the ankle.       ROS  Constitutional: Denies any fever or chills Gastrointestinal: No reported hemesis, hematochezia, vomiting, or acute GI distress Musculoskeletal:  as above Neurological: No reported episodes of acute onset apraxia, aphasia, dysarthria, agnosia, amnesia, paralysis, loss of coordination, or loss of consciousness  Medication  Review  Cholecalciferol, NEEDLE (DISP) 25 G, OneTouch Delica Plus Lancet30G, Potassium, SYRINGE-NEEDLE (DISP) 3 ML, SYRINGE/NEEDLE (DISP) 1 ML, Semaglutide (2 MG/DOSE), acetaminophen, atorvastatin, buPROPion,  carvedilol, celecoxib, cetirizine, cyanocobalamin, furosemide, isosorbide mononitrate, lisinopril, nitroGLYCERIN, pantoprazole, potassium chloride, and predniSONE  History Review  Allergy: Katrina Kelly is allergic to keflex [cephalexin], metformin and related, and lactose intolerance (gi). Drug: Katrina Kelly  reports no history of drug use. Alcohol:  reports current alcohol use of about 1.0 standard drink of alcohol per week. Tobacco:  reports that she quit smoking about 39 years ago. Her smoking use included cigarettes. She started smoking about 44 years ago. She has a 1.3 pack-year smoking history. She has never used smokeless tobacco. Social: Katrina Kelly  reports that she quit smoking about 39 years ago. Her smoking use included cigarettes. She started smoking about 44 years ago. She has a 1.3 pack-year smoking history. She has never used smokeless tobacco. She reports current alcohol use of about 1.0 standard drink of alcohol per week. She reports that she does not use drugs. Medical:  has a past medical history of Allergy, Anxiety, Basal cell carcinoma, Cardiomyopathy (HCC), CHF (congestive heart failure) (HCC), COVID-19, Diabetes type 2, uncontrolled, Heartburn, and Hypertension. Surgical: Katrina Kelly  has a past surgical history that includes Cholecystectomy (2008); Tonsillectomy (2000); Uvulectomy (2000); Nasal septum surgery (2000); Foot surgery (1982); uterine ablation; and Colonoscopy with propofol (N/A, 01/03/2017). Family: family history includes Breast cancer (age of onset: 83) in her paternal aunt; Cancer in her father; Diabetes in her brother, mother, and sister; Diabetes Mellitus II in her maternal grandmother; Emphysema in her father; Heart attack in her maternal uncle; Hypertension in her daughter and mother; Leukemia in her brother; Lymphoma in her paternal uncle; Polycystic ovary syndrome in her daughter; Stroke in her brother.  Laboratory Chemistry Profile   Renal Lab Results   Component Value Date   BUN 14 03/03/2024   CREATININE 1.17 03/03/2024   BCR NOT APPLICABLE 09/05/2020   GFR 50.01 (L) 03/03/2024    Hepatic Lab Results  Component Value Date   AST 19 03/03/2024   ALT 17 03/03/2024   ALBUMIN 4.2 03/03/2024   ALKPHOS 60 03/03/2024    Electrolytes Lab Results  Component Value Date   NA 136 03/03/2024   K 4.0 03/03/2024   CL 103 03/03/2024   CALCIUM 9.4 03/03/2024    Bone Lab Results  Component Value Date   VD25OH 20.48 (L) 03/03/2024    Inflammation (CRP: Acute Phase) (ESR: Chronic Phase) No results found for: "CRP", "ESRSEDRATE", "LATICACIDVEN"       Note: Above Lab results reviewed.    Physical Exam  General appearance: Well nourished, well developed, and well hydrated. In no apparent acute distress Mental status: Alert, oriented x 3 (person, place, & time)       Respiratory: No evidence of acute respiratory distress Eyes: PERLA Vitals: BP 90/74   Pulse 95   Temp (!) 97.3 F (36.3 C)   Resp 16   Ht 5\' 7"  (1.702 m)   Wt 188 lb (85.3 kg)   LMP  (LMP Unknown)   SpO2 99%   BMI 29.44 kg/m  BMI: Estimated body mass index is 29.44 kg/m as calculated from the following:   Height as of this encounter: 5\' 7"  (1.702 m).   Weight as of this encounter: 188 lb (85.3 kg). Ideal: Ideal body weight: 61.6 kg (135 lb 12.9 oz) Adjusted ideal body weight: 71.1 kg (156 lb 10.9 oz)  Thoracic Spine  Area Exam  Skin & Axial Inspection: No masses, redness, or swelling Alignment: Symmetrical Functional ROM: Unrestricted ROM Stability: No instability detected Muscle Tone/Strength: Functionally intact. No obvious neuro-muscular anomalies detected. Sensory (Neurological): Unimpaired Muscle strength & Tone: No palpable anomalies Lumbar Spine Area Exam  Skin & Axial Inspection: No masses, redness, or swelling Alignment: Symmetrical Functional ROM: Pain restricted ROM       Stability: No instability detected Muscle Tone/Strength: Functionally  intact. No obvious neuro-muscular anomalies detected. Sensory (Neurological): Dermatomal pain pattern to left leg Palpation: No palpable anomalies       Provocative Tests: Hyperextension/rotation test: (+) due to pain. Lumbar quadrant test (Kemp's test): (+) on the left for foraminal stenosis Lateral bending test: (+) ipsilateral radicular pain, on the left. Positive for left-sided foraminal stenosis.  Gait & Posture Assessment  Ambulation: Unassisted Gait: Relatively normal for age and body habitus Posture: WNL  Lower Extremity Exam    Side: Right lower extremity  Side: Left lower extremity  Stability: No instability observed          Stability: No instability observed          Skin & Extremity Inspection: Skin color, temperature, and hair growth are WNL. No peripheral edema or cyanosis. No masses, redness, swelling, asymmetry, or associated skin lesions. No contractures.  Skin & Extremity Inspection: Skin color, temperature, and hair growth are WNL. No peripheral edema or cyanosis. No masses, redness, swelling, asymmetry, or associated skin lesions. No contractures.  Functional ROM: Unrestricted ROM                  Functional ROM: Pain restricted ROM for all joints of the lower extremity          Muscle Tone/Strength: Functionally intact. No obvious neuro-muscular anomalies detected.  Muscle Tone/Strength: Functionally intact. No obvious neuro-muscular anomalies detected.  Sensory (Neurological): Unimpaired        Sensory (Neurological): Dermatomal pain pattern        DTR: Patellar: deferred today Achilles: deferred today Plantar: deferred today  DTR: Patellar: deferred today Achilles: deferred today Plantar: deferred today  Palpation: No palpable anomalies  Palpation: No palpable anomalies    Assessment   Diagnosis Status  1. Chronic radicular lumbar pain   2. Lumbar spondylosis   3. Lumbar facet arthropathy   4. Chronic pain syndrome    Having a  Flare-up Controlled Controlled   Updated Problems: Problem  Chronic Radicular Lumbar Pain   Assessment and Plan    Post-ablation neuritis   She is experiencing worsening pain following a nerve ablation in March, consistent with post-ablation neuritis.  Her pain is severe, particularly in the mornings and after rest, radiating down the left leg to the ankle, suggesting sciatic nerve involvement. Initiate a nine-day prednisone taper to reduce inflammation and alleviate pain. Discuss potential side effects of prednisone, including increased blood pressure and blood sugar levels. She should call the clinic in three to four weeks to report on the effectiveness of the steroid taper and cancel the epidural if pain is significantly improved. Schedule an epidural injection in six to eight weeks if the steroid taper does not provide sufficient relief.  Sciatica   Pain radiates down the left leg to the ankle, consistent with sciatica. The pain is severe and persistent, with a sensation of heaviness in the leg. Consider an epidural injection targeting the sciatic nerve if the prednisone taper does not provide adequate relief.  Follow-up   She expressed a desire to complete treatments before  July due to insurance deductible considerations. Schedule a follow-up appointment in about a month to evaluate the response to the steroid taper and discuss further treatment options if necessary.      Pharmacotherapy (Medications Ordered): Meds ordered this encounter  Medications   predniSONE (DELTASONE) 20 MG tablet    Sig: Take 3 tablets (60 mg total) by mouth daily with breakfast for 3 days, THEN 2 tablets (40 mg total) daily with breakfast for 3 days, THEN 1 tablet (20 mg total) daily with breakfast for 3 days.    Dispense:  18 tablet    Refill:  0   Orders:  Orders Placed This Encounter  Procedures   Lumbar Epidural Injection    Standing Status:   Future    Expected Date:   04/28/2024    Expiration  Date:   06/29/2024    Scheduling Instructions:     Procedure: Interlaminar Lumbar Epidural Steroid injection (LESI)            Laterality: LEFT L4/5     Sedation: PO Valium     Timeframe: As soon as schedule allows.    Where will this procedure be performed?:   [facility]   Follow-up plan:   Return in about 29 days (around 04/28/2024) for Left L4/5 ESI, in clinic (PO Valium 5mg ).     Left L3,4,5 MBNB 05/27/22, #2 07/14/23. Left L3,4,5 RFA 02/18/24       Recent Visits Date Type Provider Dept  02/18/24 Procedure visit Edward Jolly, MD [facility]  Showing recent visits within past 90 days and meeting all other requirements Today's Visits Date Type Provider Dept  03/30/24 Office Visit Edward Jolly, MD [facility]  Showing today's visits and meeting all other requirements Future Appointments No visits were found meeting these conditions. Showing future appointments within next 90 days and meeting all other requirements  I discussed the assessment and treatment plan with the patient. The patient was provided an opportunity to ask questions and all were answered. The patient agreed with the plan and demonstrated an understanding of the instructions.  Patient advised to call back or seek an in-person evaluation if the symptoms or condition worsens.  Duration of encounter: .  Total time on encounter, as per AMA guidelines included both the face-to-face and non-face-to-face time personally spent by the physician and/or other qualified health care professional(s) on the day of the encounter (includes time in activities that require the physician or other qualified health care professional and does not include time in activities normally performed by clinical staff). Physician's time may include the following activities when performed: Preparing to see the patient (e.g., pre-charting review of records, searching for previously ordered imaging, lab work, and  nerve conduction tests) Review of prior analgesic pharmacotherapies. Reviewing PMP Interpreting ordered tests (e.g., lab work, imaging, nerve conduction tests) Performing post-procedure evaluations, including interpretation of diagnostic procedures Obtaining and/or reviewing separately obtained history Performing a medically appropriate examination and/or evaluation Counseling and educating the patient/family/caregiver Ordering medications, tests, or procedures Referring and communicating with other health care professionals (when not separately reported) Documenting clinical information in the electronic or other health record Independently interpreting results (not separately reported) and communicating results to the patient/ family/caregiver Care coordination (not separately reported)  Note by: Edward Jolly, MD (TTS and AI technology used. I apologize for any typographical errors that were not detected and corrected.) Date: 03/30/2024; Time: 10:53 AM

## 2024-03-30 NOTE — Progress Notes (Signed)
 Safety precautions to be maintained throughout the outpatient stay will include: orient to surroundings, keep bed in low position, maintain call bell within reach at all times, provide assistance with transfer out of bed and ambulation.

## 2024-04-28 ENCOUNTER — Ambulatory Visit
Payer: PRIVATE HEALTH INSURANCE | Attending: Student in an Organized Health Care Education/Training Program | Admitting: Student in an Organized Health Care Education/Training Program

## 2024-04-28 ENCOUNTER — Encounter: Payer: Self-pay | Admitting: Student in an Organized Health Care Education/Training Program

## 2024-04-28 ENCOUNTER — Ambulatory Visit
Admission: RE | Admit: 2024-04-28 | Discharge: 2024-04-28 | Disposition: A | Discharge status: Home / Self Care | Payer: PRIVATE HEALTH INSURANCE | Source: Ambulatory Visit | Attending: Student in an Organized Health Care Education/Training Program | Admitting: Student in an Organized Health Care Education/Training Program

## 2024-04-28 DIAGNOSIS — G894 Chronic pain syndrome: Secondary | ICD-10-CM | POA: Diagnosis present

## 2024-04-28 DIAGNOSIS — M5416 Radiculopathy, lumbar region: Secondary | ICD-10-CM | POA: Insufficient documentation

## 2024-04-28 DIAGNOSIS — G8929 Other chronic pain: Secondary | ICD-10-CM | POA: Insufficient documentation

## 2024-04-28 MED ORDER — SODIUM CHLORIDE 0.9% FLUSH
2.0000 mL | Freq: Once | INTRAVENOUS | Status: AC
  Administered 2024-04-28: 2 mL

## 2024-04-28 MED ORDER — LIDOCAINE HCL 2 % IJ SOLN
20.0000 mL | Freq: Once | INTRAMUSCULAR | Status: AC
  Administered 2024-04-28: 100 mg

## 2024-04-28 MED ORDER — ROPIVACAINE HCL 2 MG/ML IJ SOLN
2.0000 mL | Freq: Once | INTRAMUSCULAR | Status: AC
  Administered 2024-04-28: 2 mL via EPIDURAL

## 2024-04-28 MED ORDER — LIDOCAINE HCL (PF) 2 % IJ SOLN
INTRAMUSCULAR | Status: AC
  Filled 2024-04-28: qty 5

## 2024-04-28 MED ORDER — IOHEXOL 180 MG/ML  SOLN
10.0000 mL | Freq: Once | INTRAMUSCULAR | Status: AC
  Administered 2024-04-28: 10 mL via EPIDURAL

## 2024-04-28 MED ORDER — IOHEXOL 180 MG/ML  SOLN
INTRAMUSCULAR | Status: AC
  Filled 2024-04-28: qty 20

## 2024-04-28 MED ORDER — ROPIVACAINE HCL 2 MG/ML IJ SOLN
INTRAMUSCULAR | Status: AC
  Filled 2024-04-28: qty 20

## 2024-04-28 MED ORDER — DEXAMETHASONE SODIUM PHOSPHATE 10 MG/ML IJ SOLN
INTRAMUSCULAR | Status: AC
  Filled 2024-04-28: qty 1

## 2024-04-28 MED ORDER — DIAZEPAM 5 MG PO TABS
ORAL_TABLET | ORAL | Status: AC
  Filled 2024-04-28: qty 1

## 2024-04-28 MED ORDER — DEXAMETHASONE SODIUM PHOSPHATE 10 MG/ML IJ SOLN
10.0000 mg | Freq: Once | INTRAMUSCULAR | Status: AC
  Administered 2024-04-28: 10 mg

## 2024-04-28 MED ORDER — SODIUM CHLORIDE (PF) 0.9 % IJ SOLN
INTRAMUSCULAR | Status: AC
  Filled 2024-04-28: qty 10

## 2024-04-28 MED ORDER — DIAZEPAM 5 MG PO TABS
5.0000 mg | ORAL_TABLET | ORAL | Status: AC
  Administered 2024-04-28: 5 mg via ORAL

## 2024-04-28 NOTE — Progress Notes (Signed)
 Safety precautions to be maintained throughout the outpatient stay will include: orient to surroundings, keep bed in low position, maintain call bell within reach at all times, provide assistance with transfer out of bed and ambulation.

## 2024-04-28 NOTE — Patient Instructions (Signed)

## 2024-04-28 NOTE — Progress Notes (Signed)
 PROVIDER NOTE: Interpretation of information contained herein should be left to medically-trained personnel. Specific patient instructions are provided elsewhere under "Patient Instructions" section of medical record. This document was created in part using STT-dictation technology, any transcriptional errors that may result from this process are unintentional.  Patient: Katrina Kelly Type: Established DOB: [birth year] MRN: [id number] PCP: Calista Catching, FNP  Service: Procedure DOS: 04/28/2024 Setting: Ambulatory Location: Ambulatory outpatient facility Delivery: Face-to-face Provider: Cephus Collin, MD Specialty: Interventional Pain Management Specialty designation: 09 Location: Outpatient facility Ref. Prov.: Cephus Collin, MD       Interventional Therapy   Type: Lumbar epidural steroid injection (LESI) (interlaminar) #1    Laterality: Left   Level:  L4-5 Level.  Imaging: Fluoroscopic guidance         Anesthesia: Local anesthesia (1-2% Lidocaine ) Sedation: Minimal Sedation                       DOS: 04/28/2024  Performed by: Cephus Collin, MD  Purpose: Diagnostic/Therapeutic Indications: Lumbar radicular pain of intraspinal etiology of more than 4 weeks that has failed to respond to conservative therapy and is severe enough to impact quality of life or function. 1. Chronic radicular lumbar pain   2. Chronic pain syndrome    NAS-11 Pain score:   Pre-procedure: 7 /10   Post-procedure: 7 /10      Position / Prep / Materials:  Position: Prone w/ head of the table raised (slight reverse trendelenburg) to facilitate breathing.  Prep solution: ChloraPrep (2% chlorhexidine gluconate and 70% isopropyl alcohol) Prep Area: Entire Posterior Lumbar Region from lower scapular tip down to mid buttocks area and from flank to flank. Materials:  Tray: Epidural tray Needle(s):  Type: Epidural needle (Tuohy) Gauge (G):  22 Length: Regular (3.5-in) Qty: 1  H&P (Pre-op Assessment):  Ms.  Kelly is a 63 y.o. (year old), female patient, seen today for interventional treatment. She  has a past surgical history that includes Cholecystectomy (2008); Tonsillectomy (2000); Uvulectomy (2000); Nasal septum surgery (2000); Foot surgery (1982); uterine ablation; and Colonoscopy with propofol  (N/A, 01/03/2017). Katrina Kelly has a current medication list which includes the following prescription(s): acetaminophen, atorvastatin , bd eclipse syringe, bupropion , carvedilol , celecoxib , cetirizine, cholecalciferol , cyanocobalamin , furosemide, isosorbide  mononitrate, lisinopril , b-d disp needle 25gx1", nitroglycerin, pantoprazole , potassium chloride , potassium, semaglutide  (2 mg/dose), bd eclipse syringe, and onetouch delica plus lancet30g. Her primarily concern today is the Hip Pain (left)  Initial Vital Signs:  Pulse/HCG Rate: 88ECG Heart Rate: 88 Temp: (!) 97.5 F (36.4 C) Resp: 16 BP: 112/69 SpO2: 98 %  BMI: Estimated body mass index is 29.44 kg/m as calculated from the following:   Height as of this encounter: 5\' 7"  (1.702 m).   Weight as of this encounter: 188 lb (85.3 kg).  Risk Assessment: Allergies: Reviewed. She is allergic to keflex  [cephalexin ], metformin  and related, and lactose intolerance (gi).  Allergy Precautions: None required Coagulopathies: Reviewed. None identified.  Blood-thinner therapy: None at this time Active Infection(s): Reviewed. None identified. Katrina Kelly is afebrile  Site Confirmation: Katrina Kelly was asked to confirm the procedure and laterality before marking the site Procedure checklist: Completed Consent: Before the procedure and under the influence of no sedative(s), amnesic(s), or anxiolytics, the patient was informed of the treatment options, risks and possible complications. To fulfill our ethical and legal obligations, as recommended by the American Medical Association's Code of Ethics, I have informed the patient of my clinical impression; the nature and  purpose of the treatment or  procedure; the risks, benefits, and possible complications of the intervention; the alternatives, including doing nothing; the risk(s) and benefit(s) of the alternative treatment(s) or procedure(s); and the risk(s) and benefit(s) of doing nothing. The patient was provided information about the general risks and possible complications associated with the procedure. These may include, but are not limited to: failure to achieve desired goals, infection, bleeding, organ or nerve damage, allergic reactions, paralysis, and death. In addition, the patient was informed of those risks and complications associated to Spine-related procedures, such as failure to decrease pain; infection (i.e.: Meningitis, epidural or intraspinal abscess); bleeding (i.e.: epidural hematoma, subarachnoid hemorrhage, or any other type of intraspinal or peri-dural bleeding); organ or nerve damage (i.e.: Any type of peripheral nerve, nerve root, or spinal cord injury) with subsequent damage to sensory, motor, and/or autonomic systems, resulting in permanent pain, numbness, and/or weakness of one or several areas of the body; allergic reactions; (i.e.: anaphylactic reaction); and/or death. Furthermore, the patient was informed of those risks and complications associated with the medications. These include, but are not limited to: allergic reactions (i.e.: anaphylactic or anaphylactoid reaction(s)); adrenal axis suppression; blood sugar elevation that in diabetics may result in ketoacidosis or comma; water retention that in patients with history of congestive heart failure may result in shortness of breath, pulmonary edema, and decompensation with resultant heart failure; weight gain; swelling or edema; medication-induced neural toxicity; particulate matter embolism and blood vessel occlusion with resultant organ, and/or nervous system infarction; and/or aseptic necrosis of one or more joints. Finally, the patient was  informed that Medicine is not an exact science; therefore, there is also the possibility of unforeseen or unpredictable risks and/or possible complications that may result in a catastrophic outcome. The patient indicated having understood very clearly. We have given the patient no guarantees and we have made no promises. Enough time was given to the patient to ask questions, all of which were answered to the patient's satisfaction. Ms. Kissoon has indicated that she wanted to continue with the procedure. Attestation: I, the ordering provider, attest that I have discussed with the patient the benefits, risks, side-effects, alternatives, likelihood of achieving goals, and potential problems during recovery for the procedure that I have provided informed consent. Date  Time: 04/28/2024  8:57 AM  Pre-Procedure Preparation:  Monitoring: As per clinic protocol. Respiration, ETCO2, SpO2, BP, heart rate and rhythm monitor placed and checked for adequate function Safety Precautions: Patient was assessed for positional comfort and pressure points before starting the procedure. Time-out: I initiated and conducted the "Time-out" before starting the procedure, as per protocol. The patient was asked to participate by confirming the accuracy of the "Time Out" information. Verification of the correct person, site, and procedure were performed and confirmed by me, the nursing staff, and the patient. "Time-out" conducted as per Joint Commission's Universal Protocol (UP.01.01.01). Time: 0959 Start Time: 0959 hrs.  Description/Narrative of Procedure:          Target: Epidural space via interlaminar opening, initially targeting the lower laminar border of the superior vertebral body. Region: Lumbar Approach: Percutaneous paravertebral  Rationale (medical necessity): procedure needed and proper for the diagnosis and/or treatment of the patient's medical symptoms and needs. Procedural Technique Safety Precautions:  Aspiration looking for blood return was conducted prior to all injections. At no point did we inject any substances, as a needle was being advanced. No attempts were made at seeking any paresthesias. Safe injection practices and needle disposal techniques used. Medications properly checked for expiration dates. SDV (  single dose vial) medications used. Description of the Procedure: Protocol guidelines were followed. The procedure needle was introduced through the skin, ipsilateral to the reported pain, and advanced to the target area. Bone was contacted and the needle walked caudad, until the lamina was cleared. The epidural space was identified using "loss-of-resistance technique" with 2-3 ml of PF-NaCl (0.9% NSS), in a 5cc LOR glass syringe.  Vitals:   04/28/24 0908 04/28/24 0950 04/28/24 0959 04/28/24 1004  BP: 112/69 104/70 106/80 102/75  Pulse: 88     Resp:  16 18 17   Temp: (!) 97.5 F (36.4 C)     SpO2: 98% 95% 95% 95%  Weight: 188 lb (85.3 kg)     Height: 5\' 7"  (1.702 m)       Start Time: 0959 hrs. End Time: 1005 hrs.  Imaging Guidance (Spinal):          Type of Imaging Technique: Fluoroscopy Guidance (Spinal) Indication(s): Fluoroscopy guidance for needle placement to enhance accuracy in procedures requiring precise needle localization for targeted delivery of medication in or near specific anatomical locations not easily accessible without such real-time imaging assistance. Exposure Time: Please see nurses notes. Contrast: Before injecting any contrast, we confirmed that the patient did not have an allergy to iodine, shellfish, or radiological contrast. Once satisfactory needle placement was completed at the desired level, radiological contrast was injected. Contrast injected under live fluoroscopy. No contrast complications. See chart for type and volume of contrast used. Fluoroscopic Guidance: I was personally present during the use of fluoroscopy. "Tunnel Vision Technique" used to  obtain the best possible view of the target area. Parallax error corrected before commencing the procedure. "Direction-depth-direction" technique used to introduce the needle under continuous pulsed fluoroscopy. Once target was reached, antero-posterior, oblique, and lateral fluoroscopic projection used confirm needle placement in all planes. Images permanently stored in EMR. Interpretation: I personally interpreted the imaging intraoperatively. Adequate needle placement confirmed in multiple planes. Appropriate spread of contrast into desired area was observed. No evidence of afferent or efferent intravascular uptake. No intrathecal or subarachnoid spread observed. Permanent images saved into the patient's record.  Antibiotic Prophylaxis:   Anti-infectives (From admission, onward)    None      Indication(s): None identified   Post-operative Assessment:  Post-procedure Vital Signs:  Pulse/HCG Rate: 8879 Temp: (!) 97.5 F (36.4 C) Resp: 17 BP: 102/75 SpO2: 95 %  EBL: None  Complications: No immediate post-treatment complications observed by team, or reported by patient.  Note: The patient tolerated the entire procedure well. A repeat set of vitals were taken after the procedure and the patient was kept under observation following institutional policy, for this type of procedure. Post-procedural neurological assessment was performed, showing return to baseline, prior to discharge. The patient was provided with post-procedure discharge instructions, including a section on how to identify potential problems. Should any problems arise concerning this procedure, the patient was given instructions to immediately contact us , at any time, without hesitation. In any case, we plan to contact the patient by telephone for a follow-up status report regarding this interventional procedure.  Comments:  No additional relevant information.  Plan of Care (POC)  Orders:  Orders Placed This Encounter   Procedures   DG PAIN CLINIC C-ARM 1-60 MIN NO REPORT    Intraoperative interpretation by procedural physician at [facility].    Standing Status:   Standing    Number of Occurrences:   1    Reason for exam::   Assistance in  needle guidance and placement for procedures requiring needle placement in or near specific anatomical locations not easily accessible without such assistance.    Medications ordered for procedure: Meds ordered this encounter  Medications   iohexol (OMNIPAQUE) 180 MG/ML injection 10 mL    Must be Myelogram-compatible. If not available, you may substitute with a water-soluble, non-ionic, hypoallergenic, myelogram-compatible radiological contrast medium.   lidocaine  (XYLOCAINE ) 2 % (with pres) injection 400 mg   diazepam (VALIUM) tablet 5 mg    Make sure Flumazenil is available in the pyxis when using this medication. If oversedation occurs, administer 0.2 mg IV over 15 sec. If after 45 sec no response, administer 0.2 mg again over 1 min; may repeat at 1 min intervals; not to exceed 4 doses (1 mg)   sodium chloride  flush (NS) 0.9 % injection 2 mL   ropivacaine  (PF) 2 mg/mL (0.2%) (NAROPIN ) injection 2 mL   dexamethasone  (DECADRON ) injection 10 mg   Medications administered: We administered iohexol, lidocaine , diazepam, sodium chloride  flush, ropivacaine  (PF) 2 mg/mL (0.2%), and dexamethasone .  See the medical record for exact dosing, route, and time of administration.  Follow-up plan:   Return in about 4 weeks (around 05/26/2024) for PPE, F2F.       Left L3,4,5 MBNB 05/27/22, #2 07/14/23. Left L3,4,5 RFA 02/18/24 Left L4/5 ESI 04/28/24    Recent Visits Date Type Provider Dept  03/30/24 Office Visit Cephus Collin, MD [facility]  02/18/24 Procedure visit Cephus Collin, MD [facility]  Showing recent visits within past 90 days and meeting all other requirements Today's Visits Date Type Provider Dept  04/28/24 Procedure visit Cephus Collin, MD [facility]  Showing today's visits and meeting all other requirements Future Appointments Date Type Provider Dept  06/01/24 Appointment Cephus Collin, MD [facility]  Showing future appointments within next 90 days and meeting all other requirements  Disposition: Discharge home  Discharge (Date  Time): 04/28/2024; 1015 hrs.   Primary Care Physician: Calista Catching, FNP Location: [facility] Note by: Cephus Collin, MD (TTS technology used. I apologize for any typographical errors that were not detected and corrected.) Date: 04/28/2024; Time: 10:46 AM  Disclaimer:  Medicine is not an Visual merchandiser. The only guarantee in medicine is that nothing is guaranteed. It is important to note that the decision to proceed with this intervention was based on the information collected from the patient. The Data and conclusions were drawn from the patient's questionnaire, the interview, and the physical examination. Because the information was provided in large part by the patient, it cannot be guaranteed that it has not been purposely or unconsciously manipulated. Every effort has been made to obtain as much relevant data as possible for this evaluation. It is important to note that the conclusions that lead to this procedure are derived in large part from the available data. Always take into account that the treatment will also be dependent on availability of resources and existing treatment guidelines, considered by other Pain Management Practitioners as being common knowledge and practice, at the time of the intervention. For Medico-Legal purposes, it is also important to point out that variation in procedural techniques and pharmacological choices are the acceptable norm. The indications, contraindications, technique, and results of the above procedure should only be interpreted and judged by a Board-Certified Interventional Pain Specialist with  extensive familiarity and expertise in the same exact procedure and technique.

## 2024-04-29 ENCOUNTER — Telehealth: Payer: Self-pay

## 2024-04-29 NOTE — Telephone Encounter (Signed)
 No issues post-procedure.

## 2024-05-08 ENCOUNTER — Other Ambulatory Visit: Payer: Self-pay | Admitting: Family

## 2024-05-08 DIAGNOSIS — E118 Type 2 diabetes mellitus with unspecified complications: Secondary | ICD-10-CM

## 2024-06-01 ENCOUNTER — Ambulatory Visit: Payer: PRIVATE HEALTH INSURANCE | Admitting: Student in an Organized Health Care Education/Training Program

## 2024-06-08 ENCOUNTER — Other Ambulatory Visit: Payer: Self-pay | Admitting: Family

## 2024-06-08 DIAGNOSIS — E118 Type 2 diabetes mellitus with unspecified complications: Secondary | ICD-10-CM

## 2024-06-30 ENCOUNTER — Telehealth: Payer: Self-pay

## 2024-06-30 NOTE — Telephone Encounter (Signed)
 I left a voicemail for patient asking her to please call us  if she would like to be seen sooner than 07/28/2024 for her possible sinus infection.  We may be able to get her in quicker if she is willing to see another provider.

## 2024-07-01 NOTE — Telephone Encounter (Signed)
 Noted

## 2024-07-02 ENCOUNTER — Other Ambulatory Visit: Payer: Self-pay | Admitting: Family

## 2024-07-17 ENCOUNTER — Other Ambulatory Visit: Payer: Self-pay | Admitting: Family

## 2024-07-17 DIAGNOSIS — I10 Essential (primary) hypertension: Secondary | ICD-10-CM

## 2024-07-28 ENCOUNTER — Encounter: Payer: Self-pay | Admitting: Family

## 2024-07-28 ENCOUNTER — Ambulatory Visit: Payer: PRIVATE HEALTH INSURANCE | Admitting: Family

## 2024-07-28 ENCOUNTER — Other Ambulatory Visit (HOSPITAL_COMMUNITY): Payer: Self-pay

## 2024-07-28 ENCOUNTER — Telehealth: Payer: Self-pay | Admitting: Family

## 2024-07-28 VITALS — BP 118/74 | HR 85 | Temp 98.0°F | Ht 67.0 in | Wt 187.6 lb

## 2024-07-28 DIAGNOSIS — M5416 Radiculopathy, lumbar region: Secondary | ICD-10-CM

## 2024-07-28 DIAGNOSIS — E118 Type 2 diabetes mellitus with unspecified complications: Secondary | ICD-10-CM | POA: Diagnosis not present

## 2024-07-28 DIAGNOSIS — Z8639 Personal history of other endocrine, nutritional and metabolic disease: Secondary | ICD-10-CM

## 2024-07-28 DIAGNOSIS — K219 Gastro-esophageal reflux disease without esophagitis: Secondary | ICD-10-CM | POA: Diagnosis not present

## 2024-07-28 DIAGNOSIS — E538 Deficiency of other specified B group vitamins: Secondary | ICD-10-CM | POA: Diagnosis not present

## 2024-07-28 DIAGNOSIS — G8929 Other chronic pain: Secondary | ICD-10-CM

## 2024-07-28 DIAGNOSIS — G4733 Obstructive sleep apnea (adult) (pediatric): Secondary | ICD-10-CM

## 2024-07-28 DIAGNOSIS — Z7985 Long-term (current) use of injectable non-insulin antidiabetic drugs: Secondary | ICD-10-CM

## 2024-07-28 LAB — COMPREHENSIVE METABOLIC PANEL WITH GFR
ALT: 15 U/L (ref 0–35)
AST: 17 U/L (ref 0–37)
Albumin: 4.2 g/dL (ref 3.5–5.2)
Alkaline Phosphatase: 59 U/L (ref 39–117)
BUN: 16 mg/dL (ref 6–23)
CO2: 25 meq/L (ref 19–32)
Calcium: 9 mg/dL (ref 8.4–10.5)
Chloride: 105 meq/L (ref 96–112)
Creatinine, Ser: 0.97 mg/dL (ref 0.40–1.20)
GFR: 62.45 mL/min (ref >60.00)
Glucose, Bld: 103 mg/dL — ABNORMAL HIGH (ref 70–99)
Potassium: 4.3 meq/L (ref 3.5–5.1)
Sodium: 138 meq/L (ref 135–145)
Total Bilirubin: 0.6 mg/dL (ref 0.2–1.2)
Total Protein: 6.9 g/dL (ref 6.0–8.3)

## 2024-07-28 LAB — POCT GLYCOSYLATED HEMOGLOBIN (HGB A1C): Hemoglobin A1C: 5.7 % — AB (ref 4.0–5.6)

## 2024-07-28 LAB — VITAMIN D 25 HYDROXY (VIT D DEFICIENCY, FRACTURES): VITD: 33.23 ng/mL (ref 30.00–100.00)

## 2024-07-28 MED ORDER — GABAPENTIN 100 MG PO CAPS: 100.0000 mg | ORAL_CAPSULE | Freq: Every day | ORAL | 3 refills | Status: DC

## 2024-07-28 MED ORDER — PANTOPRAZOLE SODIUM 20 MG PO TBEC: 20.0000 mg | DELAYED_RELEASE_TABLET | Freq: Every day | ORAL | 3 refills | Status: AC

## 2024-07-28 MED ORDER — CYANOCOBALAMIN 1000 MCG/ML IJ SOLN: INTRAMUSCULAR | 4 refills | Status: DC

## 2024-07-28 MED ORDER — TIRZEPATIDE 7.5 MG/0.5ML ~~LOC~~ SOAJ: 7.5000 mg | SUBCUTANEOUS | 2 refills | Status: DC

## 2024-07-28 NOTE — Assessment & Plan Note (Signed)
 Lab Results  Component Value Date   HGBA1C 5.7 (A) 07/28/2024   Type 2 diabetes is well-controlled with current medication regimen.  Consideration of switching to Mounjaro  due to persistent cravings for sweets .  - Switch from Ozempic  to Mounjaro  7.5 mg, titrate as needed. - Encourage small, frequent meals with low glycemic index foods to manage blood sugar levels.

## 2024-07-28 NOTE — Patient Instructions (Addendum)
 Start gabapentin  100mg  at bedtime and titrate every few days to max of 300mg    Change from ozempic  to mounjaro  to see if you feel better on this medication.

## 2024-07-28 NOTE — Assessment & Plan Note (Addendum)
  Chronic low back pain with radiculopathy radiating to the left ankle, exacerbated by sitting or lying down, and alleviated by movement. Previous MRI revealed L4-L5 spondylosis and grade one anterolisthesis.  Discussed potential use of gabapentin  for neuropathic pain, noting its sedative properties at higher doses, which may aid sleep. Of note, we considered Cymbalta.  - Start gabapentin  100 mg at bedtime, titrate up by 100 mg every 3-4 days as needed, up to 300 mg. - Monitor pain relief and adjust treatment as necessary.

## 2024-07-28 NOTE — Progress Notes (Signed)
 Assessment & Plan:  Chronic radicular lumbar pain Assessment & Plan:  Chronic low back pain with radiculopathy radiating to the left ankle, exacerbated by sitting or lying down, and alleviated by movement. Previous MRI revealed L4-L5 spondylosis and grade one anterolisthesis.  Discussed potential use of gabapentin  for neuropathic pain, noting its sedative properties at higher doses, which may aid sleep. Of note, we considered Cymbalta.  - Start gabapentin  100 mg at bedtime, titrate up by 100 mg every 3-4 days as needed, up to 300 mg. - Monitor pain relief and adjust treatment as necessary.  Orders: -     Gabapentin ; Take 1 capsule (100 mg total) by mouth at bedtime.  Dispense: 90 capsule; Refill: 3  Controlled type 2 diabetes mellitus with complication, without long-term current use of insulin (HCC) Assessment & Plan: Lab Results  Component Value Date   HGBA1C 5.7 (A) 07/28/2024   Type 2 diabetes is well-controlled with current medication regimen.  Consideration of switching to Mounjaro  due to persistent cravings for sweets .  - Switch from Ozempic  to Mounjaro  7.5 mg, titrate as needed. - Encourage small, frequent meals with low glycemic index foods to manage blood sugar levels.  Orders: -     Cyanocobalamin ; INJECT 1 ML IN THE MUSCLE IN THE THIGH ONCE PER MONTH  Dispense: 3 mL; Refill: 4 -     POCT glycosylated hemoglobin (Hb A1C) -     Comprehensive metabolic panel with GFR -     Tirzepatide ; Inject 7.5 mg into the skin once a week.  Dispense: 6 mL; Refill: 2  B12 deficiency -     VITAMIN D  25 Hydroxy (Vit-D Deficiency, Fractures)  History of vitamin D  deficiency  Gastroesophageal reflux disease, unspecified whether esophagitis present Assessment & Plan:  Gastroesophageal reflux disease exacerbated by certain foods. Discussed concern with current use of Celecoxib  may be contributing to gastritis.  - Refill Pantoprazole  20 mg daily, with option to increase to 40 mg daily  during flare-ups. - Advise dietary modifications to avoid trigger foods, especially in the evening.  Orders: -     Pantoprazole  Sodium; Take 1 tablet (20 mg total) by mouth daily in the afternoon. TAKE 1 TABLET(40 MG) BY MOUTH DAILY. Take while on celebrex   Dispense: 90 tablet; Refill: 3 -     Comprehensive metabolic panel with GFR  OSA (obstructive sleep apnea) Assessment & Plan: She is not wearing her cipap and discussed fatigue in setting of untreated OSA.       Return precautions given.   Risks, benefits, and alternatives of the medications and treatment plan prescribed today were discussed, and patient expressed understanding.   Education regarding symptom management and diagnosis given to patient on AVS either electronically or printed.  Return in about 6 weeks (around 09/08/2024).  Katrina Northern, FNP  Subjective:    Patient ID: Katrina Kelly, female    DOB: Jan 05, 1961, 63 y.o.   MRN: [id number]  CC: Katrina Kelly is a 63 y.o. female who presents today for an acute visit.    HPI: HPI Discussed the use of AI scribe software for clinical note transcription with the patient, who gave verbal consent to proceed.  History of Present Illness Katrina Kelly is a 63 year old female with diabetes who presents for follow-up regarding her chronic back pain and vitamin D  deficiency.  She experiences chronic back pain that worsens when sitting or lying down, particularly in the mornings and sometimes during the night, necessitating her to get up and move  around for relief. The pain radiates down her left leg to the ankle, but there is no numbness. She uses a heating pad at night to manage the pain. Despite  chiropractic and pain clinic treatments with Dr Marcelino, she has not found significant relief.   Denies leg swelling, intermittent claudication.  MRI of her lumbar spine was performed in April 2024  She denies groin pain, lateral hip pain, urinary or fecal incontinence. She has been  taking Celebrex  nightly to manage the pain, which she finds necessary for sleep.  She is not wearing her cipap. She endorses fatigue  She has a history of vitamin D  deficiency, with a level of 20 ng/mL in March 2020. She takes over-the-counter vitamin D , approximately 2000 units daily.Her B12 level was 833 pg/mL in March of this year, and she receives monthly B12 injections.  She is currently on Ozempic  for diabetes management, which she feels has been effective in controlling her blood sugar levels. However, she reports a lack of appetite and enjoyment in eating, attributing it to the combination of Ozempic  and Celebrex . She consumes protein shakes and small portions of food, noting a stable weight despite these changes. She experiences some constipation, which she manages with dietary adjustments.  She has run out of protonix  and requests a refill.   Colonoscopy dr Shila 01/03/2017      Allergies: Keflex  [cephalexin ], Metformin  and related, and Lactose intolerance (gi) Current Outpatient Medications on File Prior to Visit  Medication Sig Dispense Refill   acetaminophen (TYLENOL) 500 MG tablet Take 500 mg by mouth every 6 (six) hours as needed.     atorvastatin  (LIPITOR) 10 MG tablet TAKE 1 TABLET(10 MG) BY MOUTH DAILY 90 tablet 3   BD ECLIPSE SYRINGE 25G X 1 3 ML MISC USE TO GIVE B-12 INJECTIONS 10 each 2   buPROPion  (WELLBUTRIN  XL) 150 MG 24 hr tablet TAKE 1 TABLET(150 MG) BY MOUTH DAILY WITH BREAKFAST 90 tablet 3   carvedilol  (COREG ) 6.25 MG tablet TAKE 1 TABLET(6.25 MG) BY MOUTH TWICE DAILY WITH A MEAL 120 tablet 3   celecoxib  (CELEBREX ) 100 MG capsule Take 1 capsule (100 mg total) by mouth 2 (two) times daily after a meal. 60 capsule 5   cetirizine (ZYRTEC) 10 MG tablet Take 10 mg by mouth daily.     cholecalciferol  (VITAMIN D3) 25 MCG (1000 UNIT) tablet Take 2,000 Units by mouth daily.     furosemide (LASIX) 40 MG tablet Take 40 mg by mouth daily as needed for edema.       isosorbide  mononitrate (IMDUR ) 30 MG 24 hr tablet TAKE 1 TABLET(30 MG) BY MOUTH AT BEDTIME 30 tablet 0   lisinopril  (ZESTRIL ) 10 MG tablet TAKE 1 TABLET(10 MG) BY MOUTH DAILY 90 tablet 3   NEEDLE, DISP, 25 G (B-D DISP NEEDLE 25GX1) 25G X 1 MISC Used to give B-12 injections. 10 each 0   nitroGLYCERIN (NITROSTAT) 0.4 MG SL tablet      potassium chloride  (KLOR-CON ) 10 MEQ tablet Take 1 tablet (10 mEq total) by mouth daily as needed (swelling). 30 tablet 1   POTASSIUM PO Take 1 tablet by mouth daily.     SYRINGE/NEEDLE, DISP, 1 ML (BD ECLIPSE SYRINGE) 25G X 5/8 1 ML MISC Used to give B-12 injections. 50 each 0   Lancets (ONETOUCH DELICA PLUS LANCET30G) MISC USE AS DIRECTED 100 each 0   No current facility-administered medications on file prior to visit.    Review of Systems  Constitutional:  Negative for  chills and fever.  Respiratory:  Negative for cough.   Cardiovascular:  Negative for chest pain, palpitations and leg swelling.  Gastrointestinal:  Negative for nausea and vomiting.  Musculoskeletal:  Positive for back pain.  Neurological:  Negative for numbness.      Objective:    BP 118/74   Pulse 85   Temp 98 F (36.7 C) (Oral)   Ht 5' 7 (1.702 m)   Wt 187 lb 9.6 oz (85.1 kg)   LMP  (LMP Unknown)   SpO2 95%   BMI 29.38 kg/m   BP Readings from Last 3 Encounters:  07/28/24 118/74  04/28/24 102/75  03/30/24 90/74   Wt Readings from Last 3 Encounters:  07/28/24 187 lb 9.6 oz (85.1 kg)  04/28/24 188 lb (85.3 kg)  03/30/24 188 lb (85.3 kg)    Physical Exam Vitals reviewed.  Constitutional:      Appearance: She is well-developed.  Eyes:     Conjunctiva/sclera: Conjunctivae normal.  Cardiovascular:     Rate and Rhythm: Normal rate and regular rhythm.     Pulses: Normal pulses.     Heart sounds: Normal heart sounds.  Pulmonary:     Effort: Pulmonary effort is normal.     Breath sounds: Normal breath sounds. No wheezing, rhonchi or rales.  Skin:    General: Skin is  warm and dry.  Neurological:     Mental Status: She is alert.  Psychiatric:        Speech: Speech normal.        Behavior: Behavior normal.        Thought Content: Thought content normal.

## 2024-07-28 NOTE — Telephone Encounter (Signed)
 Dr Shila Slice you are well Im reviewing patient's colonoscopy today with her, we wanted to ensure pt is return in 10 years; or based on procedure report 3-5 yrs.   No family h/o colon cancer.   Katrina Kelly

## 2024-07-28 NOTE — Assessment & Plan Note (Signed)
  Gastroesophageal reflux disease exacerbated by certain foods. Discussed concern with current use of Celecoxib  may be contributing to gastritis.  - Refill Pantoprazole  20 mg daily, with option to increase to 40 mg daily during flare-ups. - Advise dietary modifications to avoid trigger foods, especially in the evening.

## 2024-07-28 NOTE — Assessment & Plan Note (Signed)
 She is not wearing her cipap and discussed fatigue in setting of untreated OSA.

## 2024-07-29 ENCOUNTER — Ambulatory Visit: Payer: Self-pay | Admitting: Family

## 2024-07-29 ENCOUNTER — Telehealth: Payer: Self-pay

## 2024-07-29 ENCOUNTER — Other Ambulatory Visit (HOSPITAL_COMMUNITY): Payer: Self-pay

## 2024-07-29 NOTE — Telephone Encounter (Signed)
 Pt has been notified.

## 2024-07-29 NOTE — Telephone Encounter (Signed)
 Pharmacy Patient Advocate Encounter  Received notification from [employer] that Prior Authorization for Mounjaro  7.5MG /0.5ML auto-injectors  has been APPROVED from 06/29/2024 to 07/29/2025    CaseId:[id number];Status:Approved;Review Type:Prior Auth;Coverage Start Date:06/29/2024;Coverage End Date:07/29/2025;. Authorization Expiration Date: July 29, 2025.

## 2024-07-30 NOTE — Telephone Encounter (Signed)
 All the polyps removed were nonprecancerous, hyperplastic polyps.  Okay to wait till 2028 if patient remains asymptomatic for colon cancer screening.  Thank you

## 2024-08-06 NOTE — Telephone Encounter (Signed)
 Spoke to pt informed her of results pt verbalized understanding

## 2024-08-06 NOTE — Telephone Encounter (Signed)
 Call pt  [facility], Dr Shila  Please let her know   All the polyps removed were nonprecancerous, hyperplastic polyps.  Okay to wait till 2028 if patient remains asymptomatic for colon cancer screening.

## 2024-08-18 ENCOUNTER — Other Ambulatory Visit: Payer: Self-pay | Admitting: Family

## 2024-08-18 DIAGNOSIS — I10 Essential (primary) hypertension: Secondary | ICD-10-CM

## 2024-09-08 ENCOUNTER — Ambulatory Visit: Payer: PRIVATE HEALTH INSURANCE | Admitting: Family

## 2024-09-08 ENCOUNTER — Encounter: Payer: Self-pay | Admitting: Family

## 2024-09-08 VITALS — BP 110/68 | HR 81 | Temp 98.1°F | Ht 67.0 in | Wt 188.8 lb

## 2024-09-08 DIAGNOSIS — I428 Other cardiomyopathies: Secondary | ICD-10-CM | POA: Diagnosis not present

## 2024-09-08 DIAGNOSIS — F320 Major depressive disorder, single episode, mild: Secondary | ICD-10-CM

## 2024-09-08 DIAGNOSIS — M5416 Radiculopathy, lumbar region: Secondary | ICD-10-CM

## 2024-09-08 DIAGNOSIS — E118 Type 2 diabetes mellitus with unspecified complications: Secondary | ICD-10-CM

## 2024-09-08 DIAGNOSIS — G8929 Other chronic pain: Secondary | ICD-10-CM

## 2024-09-08 DIAGNOSIS — I10 Essential (primary) hypertension: Secondary | ICD-10-CM | POA: Diagnosis not present

## 2024-09-08 MED ORDER — BUPROPION HCL ER (XL) 150 MG PO TB24: ORAL_TABLET | ORAL | 3 refills | Status: DC

## 2024-09-08 MED ORDER — LISINOPRIL 10 MG PO TABS: ORAL_TABLET | ORAL | 3 refills | Status: DC

## 2024-09-08 MED ORDER — ISOSORBIDE MONONITRATE ER 30 MG PO TB24: ORAL_TABLET | ORAL | 3 refills | Status: DC

## 2024-09-08 MED ORDER — TIRZEPATIDE 7.5 MG/0.5ML ~~LOC~~ SOAJ: 7.5000 mg | SUBCUTANEOUS | 2 refills | Status: AC

## 2024-09-08 MED ORDER — NITROGLYCERIN 0.4 MG SL SUBL: SUBLINGUAL_TABLET | SUBLINGUAL | 0 refills | Status: AC

## 2024-09-08 MED ORDER — ATORVASTATIN CALCIUM 10 MG PO TABS
ORAL_TABLET | ORAL | 3 refills | Status: AC
Start: 2024-09-08 — End: ?

## 2024-09-08 NOTE — Progress Notes (Unsigned)
 Assessment & Plan:  NICM (nonischemic cardiomyopathy) (HCC) -     Nitroglycerin ; Take 0.4 mg at onset PO chest pain; repeat every 5 minutes if angina persists; may administer up to 3 tablets in a 15-minute period. Call EMS  Dispense: 30 tablet; Refill: 0  Controlled type 2 diabetes mellitus with complication, without long-term current use of insulin (HCC) Assessment & Plan: Chronic, stable.  I have resent prescription for Mounjaro  7.5mg  today to aid in additional weight loss.  She will discontinue Ozempic .  Orders: -     Atorvastatin  Calcium ; TAKE 1 TABLET(10 MG) BY MOUTH DAILY  Dispense: 90 tablet; Refill: 3 -     Tirzepatide ; Inject 7.5 mg into the skin once a week.  Dispense: 6 mL; Refill: 2  Depression, major, single episode, mild -     buPROPion  HCl ER (XL); TAKE 1 TABLET(150 MG) BY MOUTH DAILY WITH BREAKFAST  Dispense: 90 tablet; Refill: 3  Hypertension, unspecified type Assessment & Plan: Chronic, stable.  Continue Coreg  6.5 mg twice daily, lisinopril  20 mg,imdur  30 mg every day.  Counseled on weight loss and potential to lower blood pressure.  She will let me know if any concerns of dizziness on current regimen.  Orders: -     Isosorbide  Mononitrate ER; TAKE 1 TABLET(30 MG) BY MOUTH AT BEDTIME  Dispense: 90 tablet; Refill: 3 -     Lisinopril ; TAKE 1 TABLET(10 MG) BY MOUTH DAILY  Dispense: 90 tablet; Refill: 3  Chronic radicular lumbar pain Assessment & Plan: Chronic, suboptimal control.  Nerve ablation March 2025 without relief.  Increase gabapentin  to 300 mg at night, titrating from 100 mg in hopes to decrease use of Aleve nightly. Consider tramadol 50 mg at bedtime if gabapentin  is ineffective. Monitor for sedation and constipation. Consider an updated MRI if pain persists and neurosurgical referral; she politely declines today.      Return precautions given.   Risks, benefits, and alternatives of the medications and treatment plan prescribed today were discussed, and  patient expressed understanding.   Education regarding symptom management and diagnosis given to patient on AVS either electronically or printed.  No follow-ups on file.  Katrina Northern, FNP  Subjective:    Patient ID: Katrina Kelly, female    DOB: [birth year], 63 y.o.   MRN: [id number]  CC: Katrina Kelly is a 63 y.o. female who presents today for follow up for low back pain   HPI: HPI Discussed the use of AI scribe software for clinical note transcription with the patient, who gave verbal consent to proceed.  History of Present Illness   Katrina Kelly is a 63 year old female with chronic back pain who presents for follow-up and medication management.  She experiences chronic back pain that persists despite previous interventions. She has been using gabapentin  at a low dose with minimal relief and increased the dose to 200 mg prn at bedtime without significant improvement. Taking two Advil at night allows her to sleep through the night. She uses a heating pad at night to alleviate discomfort.  She has a history of a bulging disc identified on an MRI two years ago and has undergone multiple procedures for sciatica, but the pain persists.  She also carries nitroglycerin , which she has used twice in 25 years for chest pain. She has no used in years; she requests refill today to have on hand.   She has been on Ozempic  for weight management and was supposed to switch to Mounjaro , but there was a  delay in receiving the medication due to prior authorization issues. She has not lost weight in the past three to four months and has a goal to lose 23 more pounds. She is concerned about the potential for gastrointestinal side effects with dose escalation of Mounjaro .  She is on Protonix  20 mg for reflux management.  Denies cp, sob,dizziness .No history of seizures.      Last seen by Dr. Marcelino pain management, 03/30/2024 s/p nerve ablation March 2025.      Allergies: Keflex  [cephalexin ],  Metformin  and related, and Lactose intolerance (gi) Current Outpatient Medications on File Prior to Visit  Medication Sig Dispense Refill   acetaminophen (TYLENOL) 500 MG tablet Take 500 mg by mouth every 6 (six) hours as needed.     BD ECLIPSE SYRINGE 25G X 1 3 ML MISC USE TO GIVE B-12 INJECTIONS 10 each 2   carvedilol  (COREG ) 6.25 MG tablet TAKE 1 TABLET(6.25 MG) BY MOUTH TWICE DAILY WITH A MEAL 120 tablet 3   cetirizine (ZYRTEC) 10 MG tablet Take 10 mg by mouth daily.     cholecalciferol  (VITAMIN D3) 25 MCG (1000 UNIT) tablet Take 2,000 Units by mouth daily.     cyanocobalamin  (VITAMIN B12) 1000 MCG/ML injection INJECT 1 ML IN THE MUSCLE IN THE THIGH ONCE PER MONTH 3 mL 4   furosemide (LASIX) 40 MG tablet Take 40 mg by mouth daily as needed for edema.      gabapentin  (NEURONTIN ) 100 MG capsule Take 1 capsule (100 mg total) by mouth at bedtime. 90 capsule 3   Lancets (ONETOUCH DELICA PLUS LANCET30G) MISC USE AS DIRECTED 100 each 0   NEEDLE, DISP, 25 G (B-D DISP NEEDLE 25GX1) 25G X 1 MISC Used to give B-12 injections. 10 each 0   pantoprazole  (PROTONIX ) 20 MG tablet Take 1 tablet (20 mg total) by mouth daily in the afternoon. TAKE 1 TABLET(40 MG) BY MOUTH DAILY. Take while on celebrex  90 tablet 3   potassium chloride  (KLOR-CON ) 10 MEQ tablet Take 1 tablet (10 mEq total) by mouth daily as needed (swelling). 30 tablet 1   POTASSIUM PO Take 1 tablet by mouth daily.     SYRINGE/NEEDLE, DISP, 1 ML (BD ECLIPSE SYRINGE) 25G X 5/8 1 ML MISC Used to give B-12 injections. 50 each 0   No current facility-administered medications on file prior to visit.    Review of Systems  Constitutional:  Negative for chills and fever.  Respiratory:  Negative for cough.   Cardiovascular:  Negative for chest pain and palpitations.  Gastrointestinal:  Negative for nausea and vomiting.  Musculoskeletal:  Positive for back pain.      Objective:    BP 110/68   Pulse 81   Temp 98.1 F (36.7 C) (Oral)   Ht 5'  7 (1.702 m)   Wt 188 lb 12.8 oz (85.6 kg)   SpO2 96%   BMI 29.57 kg/m  BP Readings from Last 3 Encounters:  09/08/24 110/68  07/28/24 118/74  04/28/24 102/75   Wt Readings from Last 3 Encounters:  09/08/24 188 lb 12.8 oz (85.6 kg)  07/28/24 187 lb 9.6 oz (85.1 kg)  04/28/24 188 lb (85.3 kg)    Physical Exam Vitals reviewed.  Constitutional:      Appearance: She is well-developed.  Eyes:     Conjunctiva/sclera: Conjunctivae normal.  Cardiovascular:     Rate and Rhythm: Normal rate and regular rhythm.     Pulses: Normal pulses.     Heart sounds: Normal  heart sounds.  Pulmonary:     Effort: Pulmonary effort is normal.     Breath sounds: Normal breath sounds. No wheezing, rhonchi or rales.  Skin:    General: Skin is warm and dry.  Neurological:     Mental Status: She is alert.  Psychiatric:        Speech: Speech normal.        Behavior: Behavior normal.        Thought Content: Thought content normal.

## 2024-09-08 NOTE — Patient Instructions (Addendum)
 Trial of gabapentin  300mg  at night.   Consider tramadol  Wean off of gabapentin  by 100mg  every  days as discussed if you decide not effective.   If persists, we need to arrange lumbar MRI and neurosurgeon consult.

## 2024-09-10 NOTE — Assessment & Plan Note (Signed)
 Chronic, stable.  I have resent prescription for Mounjaro  7.5mg  today to aid in additional weight loss.  She will discontinue Ozempic .

## 2024-09-10 NOTE — Assessment & Plan Note (Signed)
 Chronic, stable.  Continue Coreg  6.5 mg twice daily, lisinopril  20 mg,imdur  30 mg every day.  Counseled on weight loss and potential to lower blood pressure.  She will let me know if any concerns of dizziness on current regimen.

## 2024-09-10 NOTE — Assessment & Plan Note (Addendum)
 Chronic, suboptimal control.  Nerve ablation March 2025 without relief.  Increase gabapentin  to 300 mg at night, titrating from 100 mg in hopes to decrease use of Aleve nightly. Consider tramadol 50 mg at bedtime if gabapentin  is ineffective. Monitor for sedation and constipation. Consider an updated MRI if pain persists and neurosurgical referral; she politely declines today.

## 2024-09-29 ENCOUNTER — Other Ambulatory Visit: Payer: Self-pay | Admitting: Family

## 2024-09-29 ENCOUNTER — Encounter: Payer: Self-pay | Admitting: Family

## 2024-09-29 DIAGNOSIS — I10 Essential (primary) hypertension: Secondary | ICD-10-CM

## 2024-09-30 ENCOUNTER — Other Ambulatory Visit: Payer: Self-pay | Admitting: Family

## 2024-09-30 DIAGNOSIS — M51369 Other intervertebral disc degeneration, lumbar region without mention of lumbar back pain or lower extremity pain: Secondary | ICD-10-CM

## 2024-10-18 ENCOUNTER — Other Ambulatory Visit: Payer: Self-pay | Admitting: Family

## 2024-10-18 DIAGNOSIS — F320 Major depressive disorder, single episode, mild: Secondary | ICD-10-CM

## 2024-10-25 ENCOUNTER — Other Ambulatory Visit: Payer: Self-pay | Admitting: Family

## 2024-10-25 DIAGNOSIS — I10 Essential (primary) hypertension: Secondary | ICD-10-CM

## 2024-12-13 ENCOUNTER — Ambulatory Visit: Payer: PRIVATE HEALTH INSURANCE | Admitting: Family

## 2024-12-14 ENCOUNTER — Telehealth: Payer: Self-pay

## 2024-12-14 DIAGNOSIS — G8929 Other chronic pain: Secondary | ICD-10-CM

## 2024-12-14 MED ORDER — GABAPENTIN 300 MG PO CAPS: 300.0000 mg | ORAL_CAPSULE | Freq: Every day | ORAL | 3 refills | Status: DC

## 2024-12-14 NOTE — Telephone Encounter (Signed)
 Called and informed pt.

## 2024-12-14 NOTE — Telephone Encounter (Signed)
 Copied from CRM #[id number]. Topic: Clinical - Medical Advice >> Dec 14, 2024  9:00 AM Frederich PARAS wrote: Reason for CRM: pt would like gabapentin  300 mg instead of the 100mg . Pt also will be out of the medication early. Pt would like to know if she can up the dosage and get prescription early .

## 2024-12-14 NOTE — Addendum Note (Signed)
 Addended by: Ival Pacer G on: 12/14/2024 01:01 PM   Modules accepted: Orders

## 2024-12-22 ENCOUNTER — Other Ambulatory Visit: Payer: Self-pay | Admitting: Family

## 2024-12-22 DIAGNOSIS — I10 Essential (primary) hypertension: Secondary | ICD-10-CM

## 2025-01-11 ENCOUNTER — Ambulatory Visit: Payer: PRIVATE HEALTH INSURANCE | Admitting: Family

## 2025-01-11 ENCOUNTER — Ambulatory Visit (INDEPENDENT_AMBULATORY_CARE_PROVIDER_SITE_OTHER): Payer: PRIVATE HEALTH INSURANCE | Admitting: Family

## 2025-01-11 ENCOUNTER — Encounter: Payer: Self-pay | Admitting: Family

## 2025-01-11 VITALS — BP 108/78 | HR 81 | Temp 98.0°F | Ht 67.0 in | Wt 186.8 lb

## 2025-01-11 DIAGNOSIS — E118 Type 2 diabetes mellitus with unspecified complications: Secondary | ICD-10-CM | POA: Diagnosis not present

## 2025-01-11 DIAGNOSIS — E538 Deficiency of other specified B group vitamins: Secondary | ICD-10-CM | POA: Diagnosis not present

## 2025-01-11 DIAGNOSIS — M5442 Lumbago with sciatica, left side: Secondary | ICD-10-CM

## 2025-01-11 DIAGNOSIS — Z7985 Long-term (current) use of injectable non-insulin antidiabetic drugs: Secondary | ICD-10-CM

## 2025-01-11 DIAGNOSIS — J0101 Acute recurrent maxillary sinusitis: Secondary | ICD-10-CM | POA: Diagnosis not present

## 2025-01-11 DIAGNOSIS — G8929 Other chronic pain: Secondary | ICD-10-CM

## 2025-01-11 DIAGNOSIS — I428 Other cardiomyopathies: Secondary | ICD-10-CM

## 2025-01-11 MED ORDER — CYANOCOBALAMIN 1000 MCG/ML IJ SOLN: INTRAMUSCULAR | 4 refills | Status: AC

## 2025-01-11 MED ORDER — GABAPENTIN 300 MG PO CAPS: 600.0000 mg | ORAL_CAPSULE | Freq: Every day | ORAL | 3 refills | Status: AC

## 2025-01-11 NOTE — Assessment & Plan Note (Signed)
 Chronic, suboptimal control.  Reviewed neurosurgery consult.  Considering surgery due to persistent pain and quality of life impact. Increase gabapentin  to 600 mg at bedtime. Continue Advil as needed for pain, however advised of risk of NSAID.  Hopeful that she can decrease use of Advil and use as needed.

## 2025-01-11 NOTE — Progress Notes (Signed)
 "  Assessment & Plan:  Chronic bilateral low back pain with left-sided sciatica Assessment & Plan: Chronic, suboptimal control.  Reviewed neurosurgery consult.  Considering surgery due to persistent pain and quality of life impact. Increase gabapentin  to 600 mg at bedtime. Continue Advil as needed for pain, however advised of risk of NSAID.  Hopeful that she can decrease use of Advil and use as needed.   Orders: -     Gabapentin ; Take 2 capsules (600 mg total) by mouth at bedtime.  Dispense: 180 capsule; Refill: 3  Controlled type 2 diabetes mellitus with complication, without long-term current use of insulin (HCC)  NICM (nonischemic cardiomyopathy) (HCC) Assessment & Plan: Fortunately asymptomatic .lost to cardiology follow-up.  We jointly agreed she is overdue.  Reasonable to establish care locally.  I have placed this referral  Orders: -     Ambulatory referral to Cardiology  Acute recurrent maxillary sinusitis Assessment & Plan: Afebrile.  Nontoxic in appearance Facial pressure and thick mucus production are improving over time, likely viral. Recommend Mucinex to help break up thick mucus. Advise staying well hydrated.  If symptoms do not completely resolve, advised doxycycline  reasonable.  She will let me know how she is doing      B12 deficiency -     Cyanocobalamin ; INJECT 1 ML IN THE MUSCLE IN THE THIGH ONCE PER MONTH  Dispense: 3 mL; Refill: 4  Controlled diabetes mellitus type 2 with complications (HCC) Assessment & Plan: Chronic, stable.  Lab Results  Component Value Date   HGBA1C 5.7 (A) 07/28/2024   Continue Mounjaro  7.5mg  for weight loss, glycemic control.       Return precautions given.   Risks, benefits, and alternatives of the medications and treatment plan prescribed today were discussed, and patient expressed understanding.   Education regarding symptom management and diagnosis given to patient on AVS either electronically or printed.  No follow-ups on  file.  Rollene Northern, FNP  Subjective:    Patient ID: Katrina Kelly, female    DOB: February 01, 1961, 64 y.o.   MRN: [id number]  CC: Katrina Kelly is a 64 y.o. female who presents today for follow up.   HPI: HPI Discussed the use of AI scribe software for clinical note transcription with the patient, who gave verbal consent to proceed.  History of Present Illness   Katrina Kelly is a 64 year old female with chronic low back pain who presents for follow-up.  She has been experiencing worsening low back pain at the L4 and L5 levels for the past two years. The pain is more severe at night, affecting her sleep, and she sometimes does not fall asleep until 2 AM. The pain worsens when lying down or in a recliner with her feet up. She has tried various treatments, including exercises and injections, with minimal relief. She is currently taking gabapentin  300 mg at bedtime along with two Advil, which helps take the edge off the pain. She uses a heating pad and a heavy U-shaped pillow for support at night.   She reports sinus issues, including facial pressure on the left side, thick mucus, and occasional left ear pain, ongoing for a couple of months, Improved overall.    She has a history of sinus problems and takes Zyrtec daily but has not tried Mucinex. No fever, chills, cough, or chest pain.  Her current medications include gabapentin  300 mg at bedtime, Advil as needed, and B12 injections, which she has not taken in a couple of months. She  occasionally takes potassium for cramps but is not on Lasix anymore.     She has not required Lasix in years.  Denies shortness of breath, orthopnea, leg swelling  Consult with [facility] Dr Colon 12/22/24 ; referral to PT; discussed surgical options      She has lost a couple more pounds on Mounjaro .  She is quite pleased with this medication.       Allergies: Keflex  [cephalexin ], Metformin  and related, and Lactose intolerance  (gi) Medications Ordered Prior to Encounter[1]  Review of Systems  Constitutional:  Negative for chills and fever.  Respiratory:  Negative for cough.   Cardiovascular:  Negative for chest pain and palpitations.  Gastrointestinal:  Negative for nausea and vomiting.      Objective:    BP 108/78   Pulse 81   Temp 98 F (36.7 C) (Oral)   Ht 5' 7 (1.702 m)   Wt 186 lb 12.8 oz (84.7 kg)   SpO2 96%   BMI 29.26 kg/m  BP Readings from Last 3 Encounters:  01/11/25 108/78  09/08/24 110/68  07/28/24 118/74   Wt Readings from Last 3 Encounters:  01/11/25 186 lb 12.8 oz (84.7 kg)  09/08/24 188 lb 12.8 oz (85.6 kg)  07/28/24 187 lb 9.6 oz (85.1 kg)    Physical Exam Vitals reviewed.  Constitutional:      Appearance: She is well-developed.  HENT:     Head: Normocephalic and atraumatic.     Right Ear: Hearing, tympanic membrane, ear canal and external ear normal. No decreased hearing noted. No drainage, swelling or tenderness. No middle ear effusion. No foreign body. Tympanic membrane is not erythematous or bulging.     Left Ear: Hearing, tympanic membrane, ear canal and external ear normal. No decreased hearing noted. No drainage, swelling or tenderness.  No middle ear effusion. No foreign body. Tympanic membrane is not erythematous or bulging.     Nose: Nose normal. No rhinorrhea.     Right Sinus: No maxillary sinus tenderness or frontal sinus tenderness.     Left Sinus: No maxillary sinus tenderness or frontal sinus tenderness.     Mouth/Throat:     Pharynx: Uvula midline. No oropharyngeal exudate or posterior oropharyngeal erythema.     Tonsils: No tonsillar abscesses.  Eyes:     Conjunctiva/sclera: Conjunctivae normal.  Cardiovascular:     Rate and Rhythm: Regular rhythm.     Pulses: Normal pulses.     Heart sounds: Normal heart sounds.  Pulmonary:     Effort: Pulmonary effort is normal.     Breath sounds: Normal breath sounds. No wheezing, rhonchi or rales.   Lymphadenopathy:     Head:     Right side of head: No submental, submandibular, tonsillar, preauricular, posterior auricular or occipital adenopathy.     Left side of head: No submental, submandibular, tonsillar, preauricular, posterior auricular or occipital adenopathy.     Cervical: No cervical adenopathy.  Skin:    General: Skin is warm and dry.  Neurological:     Mental Status: She is alert.  Psychiatric:        Speech: Speech normal.        Behavior: Behavior normal.        Thought Content: Thought content normal.            [1]  Current Outpatient Medications on File Prior to Visit  Medication Sig Dispense Refill   acetaminophen (TYLENOL) 500 MG tablet Take 500 mg by mouth every 6 (six)  hours as needed.     atorvastatin  (LIPITOR) 10 MG tablet TAKE 1 TABLET(10 MG) BY MOUTH DAILY 90 tablet 3   BD ECLIPSE SYRINGE 25G X 1 3 ML MISC USE TO GIVE B-12 INJECTIONS 10 each 2   buPROPion  (WELLBUTRIN  XL) 150 MG 24 hr tablet TAKE 1 TABLET(150 MG) BY MOUTH DAILY WITH BREAKFAST 90 tablet 3   carvedilol  (COREG ) 6.25 MG tablet TAKE 1 TABLET(6.25 MG) BY MOUTH TWICE DAILY WITH A MEAL 120 tablet 2   cetirizine (ZYRTEC) 10 MG tablet Take 10 mg by mouth daily.     cholecalciferol  (VITAMIN D3) 25 MCG (1000 UNIT) tablet Take 2,000 Units by mouth daily.     isosorbide  mononitrate (IMDUR ) 30 MG 24 hr tablet TAKE 1 TABLET(30 MG) BY MOUTH AT BEDTIME 90 tablet 3   lisinopril  (ZESTRIL ) 10 MG tablet TAKE 1 TABLET(10 MG) BY MOUTH DAILY 90 tablet 3   NEEDLE, DISP, 25 G (B-D DISP NEEDLE 25GX1) 25G X 1 MISC Used to give B-12 injections. 10 each 0   nitroGLYCERIN  (NITROSTAT ) 0.4 MG SL tablet Take 0.4 mg at onset PO chest pain; repeat every 5 minutes if angina persists; may administer up to 3 tablets in a 15-minute period. Call EMS 30 tablet 0   pantoprazole  (PROTONIX ) 20 MG tablet Take 1 tablet (20 mg total) by mouth daily in the afternoon. TAKE 1 TABLET(40 MG) BY MOUTH DAILY. Take while on celebrex  90  tablet 3   SYRINGE/NEEDLE, DISP, 1 ML (BD ECLIPSE SYRINGE) 25G X 5/8 1 ML MISC Used to give B-12 injections. 50 each 0   tirzepatide  (MOUNJARO ) 7.5 MG/0.5ML Pen Inject 7.5 mg into the skin once a week. 6 mL 2   No current facility-administered medications on file prior to visit.   "

## 2025-01-11 NOTE — Assessment & Plan Note (Signed)
 Afebrile.  Nontoxic in appearance Facial pressure and thick mucus production are improving over time, likely viral. Recommend Mucinex to help break up thick mucus. Advise staying well hydrated.  If symptoms do not completely resolve, advised doxycycline  reasonable.  She will let me know how she is doing

## 2025-01-11 NOTE — Assessment & Plan Note (Addendum)
 Fortunately asymptomatic .lost to cardiology follow-up.  We jointly agreed she is overdue.  Reasonable to establish care locally.  I have placed this referral

## 2025-01-11 NOTE — Patient Instructions (Signed)
 Referral to cardiology in [place]  Let us  know if you dont hear back within 2 weeks in regards to an appointment being scheduled.   So that you are aware, if you are [facility] MyChart user , please pay attention to your MyChart messages as you may receive a MyChart message with a phone number to call and schedule this test/appointment own your own from our referral coordinator. This is a new process so I do not want you to miss this message.  If you are not a MyChart user, you will receive a phone call.

## 2025-01-11 NOTE — Assessment & Plan Note (Signed)
 Chronic, stable.  Lab Results  Component Value Date   HGBA1C 5.7 (A) 07/28/2024   Continue Mounjaro  7.5mg  for weight loss, glycemic control.

## 2025-04-12 ENCOUNTER — Ambulatory Visit: Payer: PRIVATE HEALTH INSURANCE | Admitting: Family
